# Patient Record
Sex: Female | Born: 1940 | Race: White | Hispanic: No | Marital: Single | Smoking: Former smoker
Health system: Southern US, Community
[De-identification: ages and names within clinical notes are randomized; demographics above are authoritative.]

## PROBLEM LIST (undated history)

## (undated) DIAGNOSIS — M1990 Unspecified osteoarthritis, unspecified site: Secondary | ICD-10-CM

## (undated) DIAGNOSIS — I491 Atrial premature depolarization: Secondary | ICD-10-CM

## (undated) DIAGNOSIS — Z01810 Encounter for preprocedural cardiovascular examination: Secondary | ICD-10-CM

## (undated) DIAGNOSIS — I471 Supraventricular tachycardia: Secondary | ICD-10-CM

## (undated) DIAGNOSIS — M19011 Primary osteoarthritis, right shoulder: Secondary | ICD-10-CM

## (undated) DIAGNOSIS — I447 Left bundle-branch block, unspecified: Secondary | ICD-10-CM

## (undated) DIAGNOSIS — T8859XA Other complications of anesthesia, initial encounter: Secondary | ICD-10-CM

## (undated) DIAGNOSIS — M40209 Unspecified kyphosis, site unspecified: Secondary | ICD-10-CM

## (undated) DIAGNOSIS — M722 Plantar fascial fibromatosis: Secondary | ICD-10-CM

## (undated) DIAGNOSIS — F419 Anxiety disorder, unspecified: Secondary | ICD-10-CM

## (undated) DIAGNOSIS — C801 Malignant (primary) neoplasm, unspecified: Secondary | ICD-10-CM

## (undated) DIAGNOSIS — F329 Major depressive disorder, single episode, unspecified: Secondary | ICD-10-CM

## (undated) DIAGNOSIS — K219 Gastro-esophageal reflux disease without esophagitis: Secondary | ICD-10-CM

## (undated) DIAGNOSIS — E785 Hyperlipidemia, unspecified: Secondary | ICD-10-CM

## (undated) DIAGNOSIS — M25511 Pain in right shoulder: Secondary | ICD-10-CM

## (undated) DIAGNOSIS — F32A Depression, unspecified: Secondary | ICD-10-CM

## (undated) DIAGNOSIS — T4145XA Adverse effect of unspecified anesthetic, initial encounter: Secondary | ICD-10-CM

## (undated) DIAGNOSIS — R1310 Dysphagia, unspecified: Secondary | ICD-10-CM

## (undated) DIAGNOSIS — I10 Essential (primary) hypertension: Secondary | ICD-10-CM

## (undated) HISTORY — DX: Left bundle-branch block, unspecified: I44.7

## (undated) HISTORY — DX: Encounter for preprocedural cardiovascular examination: Z01.810

## (undated) HISTORY — PX: TONSILLECTOMY: SUR1361

## (undated) HISTORY — DX: Hyperlipidemia, unspecified: E78.5

## (undated) HISTORY — DX: Unspecified kyphosis, site unspecified: M40.209

## (undated) HISTORY — DX: Plantar fascial fibromatosis: M72.2

## (undated) HISTORY — DX: Atrial premature depolarization: I49.1

## (undated) HISTORY — PX: CARDIAC CATHETERIZATION: SHX172

## (undated) HISTORY — DX: Pain in right shoulder: M25.511

## (undated) HISTORY — DX: Supraventricular tachycardia: I47.1

## (undated) HISTORY — PX: ABDOMINAL HYSTERECTOMY: SHX81

## (undated) HISTORY — DX: Dysphagia, unspecified: R13.10

## (undated) HISTORY — PX: OTHER SURGICAL HISTORY: SHX169

## (undated) HISTORY — DX: Essential (primary) hypertension: I10

---

## 1997-09-15 ENCOUNTER — Encounter: Admission: RE | Admit: 1997-09-15 | Discharge: 1997-09-15 | Payer: Self-pay | Admitting: Family Medicine

## 1997-09-27 ENCOUNTER — Ambulatory Visit (HOSPITAL_COMMUNITY): Admission: RE | Admit: 1997-09-27 | Discharge: 1997-09-27 | Payer: Self-pay | Admitting: Gynecology

## 1997-10-02 ENCOUNTER — Encounter: Admission: RE | Admit: 1997-10-02 | Discharge: 1997-10-02 | Payer: Self-pay | Admitting: Family Medicine

## 1997-10-26 ENCOUNTER — Other Ambulatory Visit: Admission: RE | Admit: 1997-10-26 | Discharge: 1997-10-26 | Payer: Self-pay | Admitting: Gynecology

## 1998-06-22 ENCOUNTER — Encounter: Admission: RE | Admit: 1998-06-22 | Discharge: 1998-06-22 | Payer: Self-pay | Admitting: Family Medicine

## 1998-09-21 ENCOUNTER — Encounter: Admission: RE | Admit: 1998-09-21 | Discharge: 1998-09-21 | Payer: Self-pay | Admitting: Sports Medicine

## 1998-11-08 ENCOUNTER — Ambulatory Visit (HOSPITAL_COMMUNITY): Admission: RE | Admit: 1998-11-08 | Discharge: 1998-11-08 | Payer: Self-pay | Admitting: Gynecology

## 1998-11-08 ENCOUNTER — Encounter: Payer: Self-pay | Admitting: Gynecology

## 1998-11-20 ENCOUNTER — Other Ambulatory Visit: Admission: RE | Admit: 1998-11-20 | Discharge: 1998-11-20 | Payer: Self-pay | Admitting: Gynecology

## 1999-11-04 ENCOUNTER — Encounter: Admission: RE | Admit: 1999-11-04 | Discharge: 1999-11-04 | Payer: Self-pay | Admitting: Family Medicine

## 1999-11-06 ENCOUNTER — Encounter: Payer: Self-pay | Admitting: Sports Medicine

## 1999-11-06 ENCOUNTER — Encounter: Admission: RE | Admit: 1999-11-06 | Discharge: 1999-11-06 | Payer: Self-pay | Admitting: Sports Medicine

## 1999-11-06 ENCOUNTER — Encounter: Admission: RE | Admit: 1999-11-06 | Discharge: 1999-11-06 | Payer: Self-pay | Admitting: Family Medicine

## 1999-11-14 ENCOUNTER — Ambulatory Visit (HOSPITAL_COMMUNITY): Admission: RE | Admit: 1999-11-14 | Discharge: 1999-11-14 | Payer: Self-pay | Admitting: Gynecology

## 1999-11-14 ENCOUNTER — Encounter: Payer: Self-pay | Admitting: Gynecology

## 1999-11-20 ENCOUNTER — Other Ambulatory Visit: Admission: RE | Admit: 1999-11-20 | Discharge: 1999-11-20 | Payer: Self-pay | Admitting: Gynecology

## 1999-12-09 ENCOUNTER — Emergency Department (HOSPITAL_COMMUNITY): Admission: EM | Admit: 1999-12-09 | Discharge: 1999-12-09 | Payer: Self-pay | Admitting: Emergency Medicine

## 2000-10-19 ENCOUNTER — Ambulatory Visit (HOSPITAL_COMMUNITY): Admission: RE | Admit: 2000-10-19 | Discharge: 2000-10-19 | Payer: Self-pay | Admitting: Sports Medicine

## 2000-11-19 ENCOUNTER — Encounter: Payer: Self-pay | Admitting: Gynecology

## 2000-11-19 ENCOUNTER — Ambulatory Visit (HOSPITAL_COMMUNITY): Admission: RE | Admit: 2000-11-19 | Discharge: 2000-11-19 | Payer: Self-pay | Admitting: Gynecology

## 2000-12-10 ENCOUNTER — Other Ambulatory Visit: Admission: RE | Admit: 2000-12-10 | Discharge: 2000-12-10 | Payer: Self-pay | Admitting: Gynecology

## 2001-04-20 ENCOUNTER — Encounter: Admission: RE | Admit: 2001-04-20 | Discharge: 2001-04-20 | Payer: Self-pay | Admitting: Sports Medicine

## 2001-05-07 ENCOUNTER — Encounter: Admission: RE | Admit: 2001-05-07 | Discharge: 2001-05-07 | Payer: Self-pay | Admitting: Sports Medicine

## 2001-05-07 ENCOUNTER — Encounter: Payer: Self-pay | Admitting: Sports Medicine

## 2001-10-22 ENCOUNTER — Ambulatory Visit (HOSPITAL_COMMUNITY): Admission: RE | Admit: 2001-10-22 | Discharge: 2001-10-22 | Payer: Self-pay | Admitting: Gastroenterology

## 2001-11-24 ENCOUNTER — Encounter: Payer: Self-pay | Admitting: Gynecology

## 2001-11-24 ENCOUNTER — Ambulatory Visit (HOSPITAL_COMMUNITY): Admission: RE | Admit: 2001-11-24 | Discharge: 2001-11-24 | Payer: Self-pay | Admitting: Gynecology

## 2001-12-17 ENCOUNTER — Other Ambulatory Visit: Admission: RE | Admit: 2001-12-17 | Discharge: 2001-12-17 | Payer: Self-pay | Admitting: Gynecology

## 2002-01-25 ENCOUNTER — Encounter: Admission: RE | Admit: 2002-01-25 | Discharge: 2002-01-25 | Payer: Self-pay | Admitting: Sports Medicine

## 2002-12-02 ENCOUNTER — Ambulatory Visit (HOSPITAL_COMMUNITY): Admission: RE | Admit: 2002-12-02 | Discharge: 2002-12-02 | Payer: Self-pay | Admitting: Gynecology

## 2002-12-02 ENCOUNTER — Encounter: Payer: Self-pay | Admitting: Gynecology

## 2002-12-19 ENCOUNTER — Other Ambulatory Visit: Admission: RE | Admit: 2002-12-19 | Discharge: 2002-12-19 | Payer: Self-pay | Admitting: Gynecology

## 2003-12-11 ENCOUNTER — Ambulatory Visit (HOSPITAL_COMMUNITY): Admission: RE | Admit: 2003-12-11 | Discharge: 2003-12-11 | Payer: Self-pay | Admitting: Gynecology

## 2003-12-12 ENCOUNTER — Encounter: Admission: RE | Admit: 2003-12-12 | Discharge: 2003-12-12 | Payer: Self-pay | Admitting: Gynecology

## 2004-01-22 ENCOUNTER — Other Ambulatory Visit: Admission: RE | Admit: 2004-01-22 | Discharge: 2004-01-22 | Payer: Self-pay | Admitting: Gynecology

## 2004-12-12 ENCOUNTER — Ambulatory Visit (HOSPITAL_COMMUNITY): Admission: RE | Admit: 2004-12-12 | Discharge: 2004-12-12 | Payer: Self-pay | Admitting: Cardiology

## 2004-12-12 ENCOUNTER — Ambulatory Visit (HOSPITAL_COMMUNITY): Admission: RE | Admit: 2004-12-12 | Discharge: 2004-12-12 | Payer: Self-pay | Admitting: Gynecology

## 2004-12-12 ENCOUNTER — Encounter (INDEPENDENT_AMBULATORY_CARE_PROVIDER_SITE_OTHER): Payer: Self-pay | Admitting: *Deleted

## 2004-12-23 ENCOUNTER — Other Ambulatory Visit: Admission: RE | Admit: 2004-12-23 | Discharge: 2004-12-23 | Payer: Self-pay | Admitting: Gynecology

## 2005-02-20 ENCOUNTER — Ambulatory Visit (HOSPITAL_COMMUNITY): Admission: RE | Admit: 2005-02-20 | Discharge: 2005-02-20 | Payer: Self-pay | Admitting: Cardiology

## 2005-03-03 ENCOUNTER — Ambulatory Visit (HOSPITAL_COMMUNITY): Admission: RE | Admit: 2005-03-03 | Discharge: 2005-03-03 | Payer: Self-pay | Admitting: Cardiology

## 2005-12-22 ENCOUNTER — Ambulatory Visit (HOSPITAL_COMMUNITY): Admission: RE | Admit: 2005-12-22 | Discharge: 2005-12-22 | Payer: Self-pay | Admitting: Gynecology

## 2006-01-06 ENCOUNTER — Other Ambulatory Visit: Admission: RE | Admit: 2006-01-06 | Discharge: 2006-01-06 | Payer: Self-pay | Admitting: Gynecology

## 2006-03-10 ENCOUNTER — Emergency Department (HOSPITAL_COMMUNITY): Admission: EM | Admit: 2006-03-10 | Discharge: 2006-03-10 | Payer: Self-pay | Admitting: Family Medicine

## 2006-10-05 ENCOUNTER — Emergency Department (HOSPITAL_COMMUNITY): Admission: EM | Admit: 2006-10-05 | Discharge: 2006-10-06 | Payer: Self-pay | Admitting: Emergency Medicine

## 2006-10-09 ENCOUNTER — Ambulatory Visit (HOSPITAL_COMMUNITY): Admission: RE | Admit: 2006-10-09 | Discharge: 2006-10-09 | Payer: Self-pay | Admitting: Cardiology

## 2006-11-06 ENCOUNTER — Encounter (INDEPENDENT_AMBULATORY_CARE_PROVIDER_SITE_OTHER): Payer: Self-pay | Admitting: Gastroenterology

## 2006-11-06 ENCOUNTER — Ambulatory Visit (HOSPITAL_COMMUNITY): Admission: RE | Admit: 2006-11-06 | Discharge: 2006-11-06 | Payer: Self-pay | Admitting: Gastroenterology

## 2006-12-24 ENCOUNTER — Ambulatory Visit (HOSPITAL_COMMUNITY): Admission: RE | Admit: 2006-12-24 | Discharge: 2006-12-24 | Payer: Self-pay | Admitting: Gynecology

## 2007-01-07 ENCOUNTER — Other Ambulatory Visit: Admission: RE | Admit: 2007-01-07 | Discharge: 2007-01-07 | Payer: Self-pay | Admitting: Gynecology

## 2007-12-28 ENCOUNTER — Ambulatory Visit (HOSPITAL_COMMUNITY): Admission: RE | Admit: 2007-12-28 | Discharge: 2007-12-28 | Payer: Self-pay | Admitting: Gynecology

## 2008-01-11 ENCOUNTER — Other Ambulatory Visit: Admission: RE | Admit: 2008-01-11 | Discharge: 2008-01-11 | Payer: Self-pay | Admitting: Gynecology

## 2009-01-02 ENCOUNTER — Ambulatory Visit (HOSPITAL_COMMUNITY): Admission: RE | Admit: 2009-01-02 | Discharge: 2009-01-02 | Payer: Self-pay | Admitting: Family Medicine

## 2009-12-25 ENCOUNTER — Encounter: Admission: RE | Admit: 2009-12-25 | Discharge: 2009-12-25 | Payer: Self-pay | Admitting: Gastroenterology

## 2010-01-09 ENCOUNTER — Ambulatory Visit (HOSPITAL_COMMUNITY): Admission: RE | Admit: 2010-01-09 | Discharge: 2010-01-09 | Payer: Self-pay | Admitting: Family Medicine

## 2010-10-08 NOTE — Cardiovascular Report (Signed)
[name], [name] NO.:  [id number]   MEDICAL RECORD NO.:  [id number]          PATIENT TYPE:  OIB   LOCATION:  2854                         FACILITY:  [facility]   PHYSICIAN:  Madaline Savage, M.D.DATE OF BIRTH:  10/28/1940   DATE OF PROCEDURE:  10/09/2006  DATE OF DISCHARGE:                            CARDIAC CATHETERIZATION   PROCEDURES PERFORMED:  Outpatient cardiac catheterization which was a  combined left heart catheterization which was uncomplicated.  We also  did an abdominal aortogram above the renal arteries to look for obvious  renal artery stenosis which was not found.   COMPLICATIONS:  None.   ENTRY SITE:  Right femoral.   DYE USED:  Omnipaque.   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  She recently presented to the [facility] Emergency Room after  a wave of heat came over her body, and she was found to have an elevated  blood pressure.  She was ultimately released from the emergency room  after observation and came to my office yesterday where I took a  clinical history and looked at her blood pressure logs.  Eighty percent  of her blood pressures were above 120 and about 20-30% of her pressures  were above 160.  The patient has been on Diovan 160 mg daily and when  she up-titrated the dose from 80 to 160, she got the current symptoms  she describes.  I have attributed those symptoms to the Diovan and  therefore stopped it but feel that the patient must have better blood  pressure control with 1 or 2 agents.   Because of her family history of coronary disease, we brought her to the  cath lab today to again look to see if she has coronary disease.  The  reason for this is because she has the new development of a left bundle-  branch block.  Today's procedure was  performed electively without  complications.   RESULTS:  1. Pressures:  Left ventricular pressure was 170/77, mean 120.  Later      during the case, the LV pressure dropped to 145/60 with a mean of      100.  There was no significant aortic valve gradient by pull-back      technique.  2. Angiographic results:  The patient's coronary arteries were      angiographically patent.  No lesions were seen throughout.      Anatomically, there was an LAD coursing through the cardiac apex      giving rise to 1 medium size diagonal branch free of any disease.      Circumflex was nondominant giving rise to 2 small obtuse marginal      branches, a posterolateral branch and then a distal circumflex      branch.  No lesions seen in that circumflex system.   Right coronary artery was dominant giving rise to a posterior descending  then a posterolateral branch and then  the posterolateral branch became a  sinus node branch.  No lesions seen.  Left ventricular angiography  showed good contractility of all wall segments except for a tiny area at  the apex which was dyskinetic, and the significance of this area is  questionable.   Abdominal aortography above the renal showed that both renal arteries  were patent, and there was no evidence of infrarenal artery pathology.   FINAL DIAGNOSES:  1. Angiographically patent coronary arteries.  2. Well preserved left ventricular systolic function.  3. Tiny area of dyskinesis of the apex of the heart of questionable      significance.  4. New left bundle-branch block since an EKG by comparison from 2      years ago.  5. Treated hypertension but uncontrolled hypertension.  6. No evidence of renal artery stenosis.   PLAN:  The patient will need better control of blood pressure,  reassurance, and probably needs to have treatment of a significant  anxiety disorder.           ______________________________  Madaline Savage, M.D.     WHG/MEDQ  D:   10/09/2006  T:  10/09/2006  Job:  [id number]   cc:   Merlene Laughter. Renae Gloss, M.D.  Cath Lab

## 2010-10-08 NOTE — Op Note (Signed)
Jasmine Swanson, Jasmine Swanson             ACCOUNT NO.:  [id number]   MEDICAL RECORD NO.:  [id number]          PATIENT TYPE:  AMB   LOCATION:  ENDO                         [facility]AC[facility]:  [facility]   PHYSICIAN:  Bernette Redbird, M.D.   DATE OF BIRTH:  1940-07-05   DATE OF PROCEDURE:  11/06/2006  DATE OF DISCHARGE:                               OPERATIVE REPORT   PROCEDURE:  Colonoscopy with biopsy.   INDICATIONS:  A 70 year old female with family history of colon cancer  in her father at an advanced age with a negative screening colonoscopy 5  years ago.   FINDINGS:  Two diminutive sessile polyps removed from the ascending  colon.   DESCRIPTION OF PROCEDURE:  The nature, purpose, risks of the procedure  were familiar to the patient from prior examination and she provided  written consent.  Sedation was fentanyl 100 mcg and Versed 10 mg IV  without arrhythmias or desaturation.  The Pentax adult video colonoscope  was advanced to the terminal ileum without significant difficulty and  pullback was then performed.  The quality of prep was excellent.  It is  felt that all areas were well seen.   On the way in, I encountered two small sessile polyps near each other in  the region of the hepatic flexure or ascending colon.  The first was  about 2-3 mm across; the other about 3-4 mm across, and each was removed  by single cold biopsy.  No large polyps, cancer, colitis, vascular  malformations, or diverticular disease were observed and retroflexion of  the rectum was unremarkable.   The patient tolerated the procedure well and there no apparent  complications.   IMPRESSION:  1. Two diminutive polyps removed as described above.  2. Family history of colon cancer.   PLAN:  Await pathology results.  Anticipate colonoscopic followup in 5  years.           ______________________________  Bernette Redbird, M.D.     RB/MEDQ  D:  11/06/2006  T:  11/07/2006  Job:  [id number]   cc:   Merlene Laughter. Renae Gloss,  M.D.  Fax: [phone]

## 2010-10-11 NOTE — Cardiovascular Report (Signed)
NAMELAPORSCHA, LINEHAN NO.:  [id number]   MEDICAL RECORD NO.:  [id number]          PATIENT TYPE:  OIB   LOCATION:  2899                         FACILITY:  [facility]   PHYSICIAN:  Madaline Savage, M.D.DATE OF BIRTH:  04-06-1941   DATE OF PROCEDURE:  03/03/2005  DATE OF DISCHARGE:                              CARDIAC CATHETERIZATION   PROCEDURES PERFORMED:  Outpatient cardiac catheterization consisting of:  A.  Left retrograde left heart catheterization.  B.  Left ventricular angiography.  C.  Selective coronary angiography.   COMPLICATIONS:  None.   ENTRY SITE:  Right femoral dye used, Omnipaque.   MEDICATIONS GIVEN:  Versed 1 mg IV, fentanyl 25 mg given twice for anxiety,  labetalol 20 mg IV for blood pressure.   COMPLICATIONS:  None.   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 She is at the [employer]. She has a family history of a mother who had coronary disease  and eventually died of complications related to coronary disease. The  patient herself has elevated cholesterol. She has those two predominant risk  factors. We have just discovered that she has an elevated blood pressure  which makes a third risk factor.   Recently she underwent, stress Cardiolite for risk factor stratification and  was and was found to have both an ST-segment depression during exercise as  well as was a mild anteroapical reperfusion defect seen. She was appraised  of the results and hesitated for some weeks and finally submitted to  undergoing cardiac catheterization which was performed at [facility] lab 3  on March 03, 2005 without complications.   RESULTS:  Left ventricular pressure was 195/11 and diastolic pressure 23,  central aortic pressure 185/75, mean of 126. There was no significant aortic  valve gradient by pullback technique.   Angiographic results show that the  coronary arteries were angiographically  patent with no lesions. They consisted of a very short left main that was  normal, an LAD that crossed the cardiac apex giving rise to a single  diagonal branch of significance and a single small septal perforator branch  of significance. The circumflex was nondominant. It gave rise to 3 obtuse  marginal branches, the third of which was fairly large. The circumflex  itself stayed in the AV groove and did not give a posterolateral or  posterior descending branch.   The right coronary artery was also normal and contained a medium size  posterior descending and a very large sinus node branch. No lesions were  seen throughout the right coronary system.   Left ventricular angiography in an RAO projection showed normal  contractility. No significant wall motion abnormalities, ejection fraction  estimate of 55-65% without mitral regurgitation. Abdominal aortography  because of the patient's elevated blood pressure showed no evidence of renal  artery stenosis. Both renal arteries were single, no abdominal aorta  pathology was seen and the common iliac's were normal as well.   FINAL DIAGNOSES:  1.  Three cardiac risk factors in a 70 year old  asymptomatic woman with an      abnormal Cardiolite stress test.  2.  Angiographic patent coronary arteries right coronary dominant system.  3.  Normal left ventricular systolic function.  4.  Normal renal arteries and abdominal aorta.   PLAN:  The patient is reassured. We will discharge in 4 hours. We will  continue with risk factor modification and the patient can be reassured  based on the positive aspects of this study.           ______________________________  Madaline Savage, M.D.     WHG/MEDQ  D:  03/03/2005  T:  03/03/2005  Job:  [id number]   cc:   Merlene Laughter. Renae Gloss, M.D.  Fax: [phone]   [facility]

## 2010-10-11 NOTE — Procedures (Signed)
. [facility]  Patient:    MILAN, CLARE Visit Number: [id number] MRN: [id number]          Service Type: END Location: ENDO Attending Physician:  Rich Brave Dictated by:   Florencia Reasons, M.D. Proc. Date: 10/22/01 Admit Date:  10/22/2001                             Procedure Report  PROCEDURE:  Colonoscopy.  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  Screening colonoscopy five years ago was normal.  FINDINGS:  Normal exam.  DESCRIPTION OF PROCEDURE:  _____ were familiar to the patient, who provided written consent.  Sedation is fentanyl 100 mcg and Versed 10 mg IV prior to and during the course of the procedure without arrhythmias or desaturation. The Olympus adjustable-tension pediatric video colonoscope was advanced without significant difficulty to the cecum and for a short distance into the normal-appearing terminal ileum, whereupon pullback was initiated.  External abdominal compression was used to help control looping.  The quality of the prep was excellent, and it is felt that all areas were well-seen.  This was a normal examination.  No polyps, cancer, colitis, vascular malformations, or diverticular disease were observed.  Retroflexion in the rectum as well as reinspection of the rectosigmoid was unremarkable.  No biopsies were taken.  The patient tolerated the procedure well, and there were no apparent complications.  IMPRESSION:  Normal colonoscopy.  PLAN:  Follow-up colonoscopy five years. Dictated by:   Florencia Reasons, M.D. Attending Physician:  Rich Brave DD:  10/22/01 TD:  10/25/01 Job: [id number]

## 2010-10-11 NOTE — Op Note (Signed)
NAMEDIANNA, EWALD             ACCOUNT NO.:  [id number]   MEDICAL RECORD NO.:  [id number]          PATIENT TYPE:  OUT   LOCATION:  NUC                          FACILITY:  [facility]   PHYSICIAN:  Vonna Kotyk R. Jacinto Halim, MD       DATE OF BIRTH:  02-08-1941   DATE OF PROCEDURE:  02/20/2005  DATE OF DISCHARGE:  02/20/2005                                 OPERATIVE REPORT   PROCEDURE PERFORMED:  Treadmill exercise Cardiolite stress test using Bruce  protocol.   INDICATION:  Ms. Kayelyn Lemon is a 70 year old female with a very strong  family history of premature coronary artery disease and hyperlipidemia, who  is being screened for coronary artery disease.   STRESS DATA:  The resting ECG demonstrates normal sinus rhythm.   Resting heart rate is 86 beats per minute with a blood pressure of 155/73  mmHg.   The maximum heart rate was 166 beats 90% of max predicted heart rate for age  with a blood pressure of 226/64 mmHg.   At seven minutes into exercise, the patient started to have ST segment  depression.  At peak exercise, there was a 2 mm horizontal ST segment  depression associated with T-wave inversion noted in the inferolateral  leads.  This change has persisted for six minutes into recovery.  This was  positive for myocardial ischemia.  The patient remained asymptomatic.   FINAL INTERPRETATION:  1.  Resting shows NSR. Stress ECG was positive for myocardial ischemia with      development of 2 mm horizontal ST segment depression at peak exercise,      which persisted for six minutes into recovery.  2.  Hypertensive blood pressure response.  3.  The Cardiolite portion is pending.      Cristy Hilts. Jacinto Halim, MD  Electronically Signed     JRG/MEDQ  D:  02/24/2005  T:  02/24/2005  Job:  [id number]

## 2010-10-16 ENCOUNTER — Other Ambulatory Visit: Payer: Self-pay | Admitting: Dermatology

## 2010-12-10 ENCOUNTER — Other Ambulatory Visit (HOSPITAL_COMMUNITY): Payer: Self-pay | Admitting: Family Medicine

## 2010-12-10 DIAGNOSIS — Z1231 Encounter for screening mammogram for malignant neoplasm of breast: Secondary | ICD-10-CM

## 2011-01-15 ENCOUNTER — Ambulatory Visit (HOSPITAL_COMMUNITY): Payer: Medicare Other

## 2011-01-22 ENCOUNTER — Ambulatory Visit (HOSPITAL_COMMUNITY)
Admission: RE | Admit: 2011-01-22 | Discharge: 2011-01-22 | Disposition: A | Discharge status: Home / Self Care | Payer: Medicare Other | Source: Ambulatory Visit | Attending: Family Medicine | Admitting: Family Medicine

## 2011-01-22 DIAGNOSIS — Z1231 Encounter for screening mammogram for malignant neoplasm of breast: Secondary | ICD-10-CM | POA: Insufficient documentation

## 2011-11-14 ENCOUNTER — Ambulatory Visit: Payer: Medicare Other | Attending: Family Medicine | Admitting: Physical Therapy

## 2011-11-14 DIAGNOSIS — M542 Cervicalgia: Secondary | ICD-10-CM | POA: Insufficient documentation

## 2011-11-14 DIAGNOSIS — M2560 Stiffness of unspecified joint, not elsewhere classified: Secondary | ICD-10-CM | POA: Insufficient documentation

## 2011-11-14 DIAGNOSIS — IMO0001 Reserved for inherently not codable concepts without codable children: Secondary | ICD-10-CM | POA: Insufficient documentation

## 2011-11-18 ENCOUNTER — Ambulatory Visit: Payer: Medicare Other | Admitting: Physical Therapy

## 2011-11-21 ENCOUNTER — Ambulatory Visit: Payer: Medicare Other | Admitting: Physical Therapy

## 2011-11-25 ENCOUNTER — Ambulatory Visit: Payer: MEDICARE | Attending: Family Medicine | Admitting: Physical Therapy

## 2011-11-25 DIAGNOSIS — M542 Cervicalgia: Secondary | ICD-10-CM | POA: Insufficient documentation

## 2011-11-25 DIAGNOSIS — M2560 Stiffness of unspecified joint, not elsewhere classified: Secondary | ICD-10-CM | POA: Insufficient documentation

## 2011-11-25 DIAGNOSIS — IMO0001 Reserved for inherently not codable concepts without codable children: Secondary | ICD-10-CM | POA: Insufficient documentation

## 2011-11-28 ENCOUNTER — Ambulatory Visit: Payer: MEDICARE | Admitting: Physical Therapy

## 2011-12-02 ENCOUNTER — Ambulatory Visit: Payer: MEDICARE | Admitting: Physical Therapy

## 2011-12-04 ENCOUNTER — Encounter: Payer: Medicare Other | Admitting: Physical Therapy

## 2011-12-09 ENCOUNTER — Ambulatory Visit: Payer: MEDICARE | Admitting: Physical Therapy

## 2011-12-12 ENCOUNTER — Ambulatory Visit: Payer: MEDICARE | Admitting: Physical Therapy

## 2011-12-16 ENCOUNTER — Encounter: Payer: Medicare Other | Admitting: Physical Therapy

## 2011-12-19 ENCOUNTER — Encounter: Payer: Medicare Other | Admitting: Physical Therapy

## 2011-12-30 ENCOUNTER — Other Ambulatory Visit (HOSPITAL_COMMUNITY): Payer: Self-pay | Admitting: Family Medicine

## 2011-12-30 DIAGNOSIS — Z1231 Encounter for screening mammogram for malignant neoplasm of breast: Secondary | ICD-10-CM

## 2012-01-19 ENCOUNTER — Ambulatory Visit (INDEPENDENT_AMBULATORY_CARE_PROVIDER_SITE_OTHER): Payer: Medicare Other | Admitting: Family Medicine

## 2012-01-19 ENCOUNTER — Encounter: Payer: Self-pay | Admitting: Family Medicine

## 2012-01-19 ENCOUNTER — Ambulatory Visit: Payer: Medicare Other | Attending: Family Medicine | Admitting: Physical Therapy

## 2012-01-19 VITALS — HR 74 | Ht 64.0 in | Wt 129.0 lb

## 2012-01-19 DIAGNOSIS — M25511 Pain in right shoulder: Secondary | ICD-10-CM

## 2012-01-19 DIAGNOSIS — IMO0001 Reserved for inherently not codable concepts without codable children: Secondary | ICD-10-CM | POA: Insufficient documentation

## 2012-01-19 DIAGNOSIS — M542 Cervicalgia: Secondary | ICD-10-CM | POA: Insufficient documentation

## 2012-01-19 DIAGNOSIS — M2560 Stiffness of unspecified joint, not elsewhere classified: Secondary | ICD-10-CM | POA: Insufficient documentation

## 2012-01-19 DIAGNOSIS — M25519 Pain in unspecified shoulder: Secondary | ICD-10-CM

## 2012-01-19 NOTE — Patient Instructions (Addendum)
You have rotator cuff impingement, possibly developing frozen shoulder but at very early stages if so. Try to avoid painful activities (overhead activities, lifting with extended arm) as much as possible. Capsaicin, flexall, or aspercreme 4 times a day to affected shoulder for pain. Subacromial injection may be beneficial to help with pain and to decrease inflammation if struggling. Consider physical therapy. Do home exercise program with theraband and scapular stabilization exercises daily - these are very important for long term relief even if an injection was given; each exercise 3 sets of 10 once a day. Heat 15 minutes at a time 3-4 times a day If not improving at follow-up we will consider injection and/or physical therapy. Follow up with me in 1 month.

## 2012-01-20 ENCOUNTER — Encounter: Payer: Self-pay | Admitting: Family Medicine

## 2012-01-20 DIAGNOSIS — M25511 Pain in right shoulder: Secondary | ICD-10-CM

## 2012-01-20 HISTORY — DX: Pain in right shoulder: M25.511

## 2012-01-20 NOTE — Progress Notes (Signed)
  Subjective:    Patient ID: Jasmine Swanson, female    DOB: 1940/06/10, 71 y.o.   MRN: [id number]  PCP: Gweneth Dimitri MD  HPI 71 yo F here for right shoulder pain.  Patient denies known injury. States since end of June 2013 started to develop right shoulder pain worse with turning motions (like steering wheel), overhead, reaching behind. Previously went to PT for neck issues and having trouble doing some exercises as they hurt right shoulder. ROM is painful, limited. Heat helps. Difficulty taking antiinflammatories so avoids these. No prior issues with right shoulder.  Past Medical History  Diagnosis Date  . Hypertension   . Hyperlipidemia     Current Outpatient Prescriptions on File Prior to Visit  Medication Sig Dispense Refill  . metoprolol (TOPROL-XL) 200 MG 24 hr tablet Take 200 mg by mouth daily.      . simvastatin (ZOCOR) 80 MG tablet Take 80 mg by mouth at bedtime.        Past Surgical History  Procedure Date  . Abdominal hysterectomy     Allergies  Allergen Reactions  . Codeine   . Diovan (Valsartan)   . Niacin And Related     History   Social History  . Marital Status: Divorced    Spouse Name: N/A    Number of Children: N/A  . Years of Education: N/A   Occupational History  . Not on file.   Social History Main Topics  . Smoking status: Never Smoker   . Smokeless tobacco: Not on file  . Alcohol Use: Not on file  . Drug Use: Not on file  . Sexually Active: Not on file   Other Topics Concern  . Not on file   Social History Narrative  . No narrative on file    Family History  Problem Relation Age of Onset  . Hyperlipidemia Mother   . Hypertension Mother   . Heart attack Mother   . Sudden death Neg Hx   . Diabetes Neg Hx     Pulse 74  Ht 5\' 4"  (1.626 m)  Wt 129 lb (58.514 kg)  BMI 22.14 kg/m2  Review of Systems See HPI above.    Objective:   Physical Exam Gen: NAD  R shoulder: No swelling, ecchymoses.  No gross  deformity. No TTP biceps tendon, AC joint.  Mild post capsule TTP. Lacks about 10 degrees ER.  Otherwise FROM - + painful arc. Positive Hawkins, Neers. Negative Speeds, Yergasons. Strength 5/5 with empty can and resisted internal/external rotation.  Mild pain with ER and empty can. Negative apprehension. NV intact distally.  L shoulder: FROM without pain or weakness.    Assessment & Plan:  1. Right shoulder pain - 2/2 rotator cuff impingement.  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  She would like to start home exercise program first and consider injection and/or PT only if this is not helping.  Demonstrated today and handout provided.  Discussed topical medications to help with pain.  Heat as needed.  F/u in 1 month for reevaluation.

## 2012-01-20 NOTE — Assessment & Plan Note (Signed)
2/2 rotator cuff impingement.  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  She would like to start home exercise program first and consider injection and/or PT only if this is not helping.  Demonstrated today and handout provided.  Discussed topical medications to help with pain.  Heat as needed.  F/u in 1 month for reevaluation.

## 2012-01-21 ENCOUNTER — Encounter (HOSPITAL_COMMUNITY): Payer: Self-pay | Admitting: Emergency Medicine

## 2012-01-21 ENCOUNTER — Emergency Department (HOSPITAL_COMMUNITY)
Admission: EM | Admit: 2012-01-21 | Discharge: 2012-01-21 | Disposition: A | Discharge status: Home / Self Care | Payer: MEDICARE | Attending: Emergency Medicine | Admitting: Emergency Medicine

## 2012-01-21 DIAGNOSIS — I10 Essential (primary) hypertension: Secondary | ICD-10-CM | POA: Insufficient documentation

## 2012-01-21 DIAGNOSIS — R000 Tachycardia, unspecified: Secondary | ICD-10-CM | POA: Insufficient documentation

## 2012-01-21 LAB — TROPONIN I: Troponin I: <0.3 ng/mL (ref <0.30)

## 2012-01-21 LAB — BASIC METABOLIC PANEL
Calcium: 10.5 mg/dL (ref 8.4–10.5)
GFR calc Af Amer: >90 mL/min (ref >90)
Potassium: 3.8 mEq/L (ref 3.5–5.1)

## 2012-01-21 MED ORDER — ACETAMINOPHEN 325 MG PO TABS
650.0000 mg | ORAL_TABLET | Freq: Once | ORAL | Status: AC
  Administered 2012-01-21: 650 mg via ORAL
  Filled 2012-01-21: qty 2

## 2012-01-21 MED ORDER — LABETALOL HCL 5 MG/ML IV SOLN
20.0000 mg | Freq: Once | INTRAVENOUS | Status: AC
  Administered 2012-01-21: 20 mg via INTRAVENOUS
  Filled 2012-01-21: qty 4

## 2012-01-21 MED ORDER — SODIUM CHLORIDE 0.9 % IV SOLN
INTRAVENOUS | Status: DC
  Administered 2012-01-21: 12:00:00 via INTRAVENOUS

## 2012-01-21 NOTE — ED Provider Notes (Signed)
I saw and evaluated the patient, reviewed the resident's note and I agree with the findings and plan. She was at the beauty shop getting her hair done.  She developed a sensation of feeling warm with palpitations.   She denies pain. She denies sob, n/v/light headedness.   She was anxious and staff called ems.    She had htn and tachycardia.  txd with labetalol.  sxs resolved now.  No signs acs.   No end organ damage.  Cheri Guppy, MD 01/21/12 [phone]

## 2012-01-21 NOTE — ED Provider Notes (Signed)
History     CSN: [id number]  Arrival date & time 01/21/12  1013   First MD Initiated Contact with Patient 01/21/12 1157      Chief Complaint  Patient presents with  . Palpitations    HPI 71 yo female with h/o HTN who presents with sudden onset of palpitations this morning. She states that she applied capsaicin cream to her right shoulder and 1hr later felt like her heart was beating fast, associated with sensation of overwhelming warmth through her body as well as shaking and tremulousness. She felt dizzy as well. No chest pain, no shortness of breath, no nausea or vomiting.  She has had similar episodes in the past with use of nystatin and diovan.  Past Medical History  Diagnosis Date  . Hypertension   . Hyperlipidemia     Past Surgical History  Procedure Date  . Abdominal hysterectomy   . Tonsillectomy     Family History  Problem Relation Age of Onset  . Hyperlipidemia Mother   . Hypertension Mother   . Heart attack Mother   . Sudden death Neg Hx   . Diabetes Neg Hx     History  Substance Use Topics  . Smoking status: Never Smoker   . Smokeless tobacco: Not on file  . Alcohol Use: Not on file    Review of Systems  All other systems reviewed and are negative.    Allergies  Codeine; Diovan; Niacin and related; and Solu-medrol  Home Medications   Current Outpatient Rx  Name Route Sig Dispense Refill  . ACETAMINOPHEN ER 650 MG PO TBCR Oral Take 650 mg by mouth every 8 (eight) hours as needed. For pain    . ASPIRIN 81 MG PO CHEW Oral Chew 81 mg by mouth 3 (three) times a week. Mon-Wed-Fri    . CALCIUM-VITAMIN D 600-400 MG-UNIT PO TABS Oral Take 1 tablet by mouth daily.    Marland Kitchen CALCIUM CARBONATE ANTACID 500 MG PO CHEW Oral Chew 1 tablet by mouth daily as needed. For upset stomach    . CAPSAICIN 0.025 % EX GEL Apply externally Apply topically.    Marland Kitchen VITAMIN D 2000 UNITS PO CAPS Oral Take 1 capsule by mouth daily.    . OMEGA-3 FATTY ACIDS 1000 MG PO CAPS Oral Take  1 g by mouth 3 (three) times daily.    Marland Kitchen FLAXSEED OIL 1000 MG PO CAPS Oral Take 1,000 mg by mouth daily.    Marland Kitchen MAGNESIUM 250 MG PO TABS Oral Take 1 tablet by mouth daily.    Marland Kitchen METOPROLOL SUCCINATE ER 200 MG PO TB24 Oral Take 200 mg by mouth daily.    Marland Kitchen OMEPRAZOLE 20 MG PO CPDR Oral Take 20 mg by mouth every other day.    Marland Kitchen VITAMIN B-6 100 MG PO TABS Oral Take 100 mg by mouth daily.    Marland Kitchen SIMVASTATIN 80 MG PO TABS Oral Take 80 mg by mouth daily.     Marland Kitchen VITAMIN A PO Oral Take 5,000 Units by mouth 2 (two) times a week. Mon-Fri    . VITAMIN C 500 MG PO TABS Oral Take 500 mg by mouth daily.      BP 186/80  Pulse 93  Temp 98.4 F (36.9 C) (Oral)  Resp 18  SpO2 99%  Physical Exam  Constitutional: She is oriented to person, place, and time. No distress.  HENT:  Mouth/Throat: Oropharynx is clear and moist.  Eyes: EOM are normal. Pupils are equal, round, and reactive to  light.  Neck: Normal range of motion.  Cardiovascular: Normal rate, regular rhythm and normal heart sounds.   Pulmonary/Chest: Effort normal and breath sounds normal. No respiratory distress. She has no wheezes.  Abdominal: Soft. She exhibits no distension. There is no tenderness.  Musculoskeletal: She exhibits no edema.  Neurological: She is alert and oriented to person, place, and time.  Skin: No rash noted.  Psychiatric: She has a normal mood and affect. Her behavior is normal.    ED Course  Procedures (including critical care time)   Date: 01/21/2012  Rate: 105  Rhythm: normal sinus rhythm  QRS Axis: left bundle branch block with QRS:122  Intervals: QRS widened  ST/T Wave abnormalities: normal  Narrative Interpretation: left bundle branch block  Old EKG Reviewed: none  BMET    Component Value Date/Time   NA 139 01/21/2012 1108   K 3.8 01/21/2012 1108   CL 99 01/21/2012 1108   CO2 23 01/21/2012 1108   GLUCOSE 121* 01/21/2012 1108   BUN 9 01/21/2012 1108   CREATININE 0.57 01/21/2012 1108   CALCIUM 10.5 01/21/2012  1108   GFRNONAA 90* 01/21/2012 1108   GFRAA >90 01/21/2012 1108    1. Hypertension       MDM  Patient reports having been diagnosed with a left bundle branch block before for which she had negative caths. She denies any chest pain or shortness of breath during her ED course. Troponin normal. BMP normal.  Had initially elevated blood pressure at 238/115 with tachycardia to 108's for which she was given labetolol 20mg  IV.  Tachycardia persisted and additional labetalol dose 20mg  was administered with improvement of heart rate. Patient discharged home in stable medical condition with follow up with Primary care doctor and red flags for return to the ED.         Lonia Skinner, MD 01/21/12 [phone]

## 2012-01-21 NOTE — ED Notes (Signed)
Pt arrived by EMS. While getting her hair done this morning she started having palpitations. Pt stated that she applied a medication patch that is OTC for rotator cuff problems. Intermittent palpitations have been occuring. Denies chest pain, dizziness and shortness of breath.

## 2012-01-25 NOTE — ED Provider Notes (Signed)
I saw and evaluated the patient, reviewed the resident's note and I agree with the findings and plan.  Cheri Guppy, MD 01/25/12 [phone]

## 2012-01-28 ENCOUNTER — Ambulatory Visit (HOSPITAL_COMMUNITY)
Admission: RE | Admit: 2012-01-28 | Discharge: 2012-01-28 | Disposition: A | Discharge status: Home / Self Care | Payer: Medicare Other | Source: Ambulatory Visit | Attending: Family Medicine | Admitting: Family Medicine

## 2012-01-28 DIAGNOSIS — Z1231 Encounter for screening mammogram for malignant neoplasm of breast: Secondary | ICD-10-CM | POA: Insufficient documentation

## 2012-03-08 ENCOUNTER — Other Ambulatory Visit: Payer: Self-pay | Admitting: Gastroenterology

## 2012-10-13 ENCOUNTER — Other Ambulatory Visit: Payer: Self-pay | Admitting: Dermatology

## 2012-12-31 ENCOUNTER — Other Ambulatory Visit (HOSPITAL_COMMUNITY): Payer: Self-pay | Admitting: Family Medicine

## 2012-12-31 DIAGNOSIS — Z1231 Encounter for screening mammogram for malignant neoplasm of breast: Secondary | ICD-10-CM

## 2013-02-01 ENCOUNTER — Ambulatory Visit (HOSPITAL_COMMUNITY)
Admission: RE | Admit: 2013-02-01 | Discharge: 2013-02-01 | Disposition: A | Discharge status: Home / Self Care | Payer: Medicare Other | Source: Ambulatory Visit | Attending: Family Medicine | Admitting: Family Medicine

## 2013-02-01 DIAGNOSIS — Z1231 Encounter for screening mammogram for malignant neoplasm of breast: Secondary | ICD-10-CM | POA: Insufficient documentation

## 2013-03-30 ENCOUNTER — Encounter: Payer: Self-pay | Admitting: Cardiology

## 2013-06-15 ENCOUNTER — Encounter: Payer: Self-pay | Admitting: *Deleted

## 2013-06-16 ENCOUNTER — Ambulatory Visit: Payer: Medicare Other | Admitting: Cardiology

## 2013-06-17 ENCOUNTER — Encounter (INDEPENDENT_AMBULATORY_CARE_PROVIDER_SITE_OTHER): Payer: Self-pay

## 2013-06-17 ENCOUNTER — Ambulatory Visit (INDEPENDENT_AMBULATORY_CARE_PROVIDER_SITE_OTHER): Payer: Medicare Other | Admitting: Cardiology

## 2013-06-17 ENCOUNTER — Encounter: Payer: Self-pay | Admitting: Cardiology

## 2013-06-17 VITALS — BP 209/84 | HR 110 | Ht 64.0 in | Wt 131.0 lb

## 2013-06-17 DIAGNOSIS — E785 Hyperlipidemia, unspecified: Secondary | ICD-10-CM | POA: Insufficient documentation

## 2013-06-17 DIAGNOSIS — I10 Essential (primary) hypertension: Secondary | ICD-10-CM | POA: Insufficient documentation

## 2013-06-17 DIAGNOSIS — I471 Supraventricular tachycardia, unspecified: Secondary | ICD-10-CM | POA: Insufficient documentation

## 2013-06-17 DIAGNOSIS — I491 Atrial premature depolarization: Secondary | ICD-10-CM

## 2013-06-17 DIAGNOSIS — I4719 Other supraventricular tachycardia: Secondary | ICD-10-CM | POA: Insufficient documentation

## 2013-06-17 HISTORY — DX: Supraventricular tachycardia: I47.1

## 2013-06-17 HISTORY — DX: Essential (primary) hypertension: I10

## 2013-06-17 HISTORY — DX: Atrial premature depolarization: I49.1

## 2013-06-17 HISTORY — DX: Other supraventricular tachycardia: I47.19

## 2013-06-17 NOTE — Progress Notes (Signed)
[facility]. [address], [address] [place], [place]  [address] Phone: [phone] Fax:  [phone]  Date:  06/17/2013   ID:  Jasmine Swanson, DOB [birth year], MRN [id number]  PCP:  No primary provider on file.   History of Present Illness: Jasmine Swanson is a 73 y.o. female with a hx of PAT and hypertension. She had multiple cardiac caths in 2008 that were normal and past nuclear stress test without ischemia and EF 52%.Has done failrly well with Lexapro, ativan. Panic attacks.  Walking 2 miles at the gym. Orthopedics issue.   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 She is not having dizziness/syncope nor chest pain. She feels she is very sensitive and intolerant of alot of medications. She also is aware that her anxiety needs to be treated supported by her daughter, who is also an Therapist, sports. She has not had further presyncope/weak spells as described to Dr Marlou Porch at last annual vist    Wt Readings from Last 3 Encounters:  06/17/13 131 lb (59.421 kg)  01/19/12 129 lb (58.514 kg)     Past Medical History  Diagnosis Date  . Hypertension   . Hyperlipidemia   . Dysphagia   . Bundle branch block, left   . Plantar fasciitis of right foot   . Kyphosis     Past Surgical History  Procedure Laterality Date  . Abdominal hysterectomy    . Tonsillectomy    . Cardiac catheterization      Current Outpatient Prescriptions  Medication Sig Dispense Refill  . acetaminophen (TYLENOL) 650 MG CR tablet Take 650 mg by mouth every 8 (eight) hours as needed. For pain      . aspirin 81 MG chewable tablet Chew 81 mg by mouth 3 (three) times a week. Mon-Wed-Fri      . Calcium Carb-Cholecalciferol (CALCIUM-VITAMIN D) 600-400 MG-UNIT TABS Take 1 tablet by mouth daily.      . calcium carbonate (TUMS - DOSED IN MG ELEMENTAL CALCIUM) 500 MG chewable tablet Chew 1 tablet by mouth daily as needed. For upset stomach      . Capsaicin 0.025 % GEL Apply topically.      .  Cholecalciferol (VITAMIN D) 2000 UNITS CAPS Take 2 capsules by mouth daily.       Marland Kitchen escitalopram (LEXAPRO) 5 MG tablet       . fish oil-omega-3 fatty acids 1000 MG capsule Take 1 g by mouth 3 (three) times daily.      . Flaxseed, Linseed, (FLAXSEED OIL) 1000 MG CAPS Take 1,000 mg by mouth daily.      . Magnesium 250 MG TABS Take 1 tablet by mouth daily.      . metoprolol (TOPROL-XL) 200 MG 24 hr tablet Take 200 mg by mouth daily.      Marland Kitchen omeprazole (PRILOSEC) 20 MG capsule Take 20 mg by mouth every other day.      . pyridOXINE (VITAMIN B-6) 100 MG tablet Take 100 mg by mouth daily.      . simvastatin (ZOCOR) 80 MG tablet Take 80 mg by mouth daily.       Marland Kitchen VITAMIN A PO Take 5,000 Units by mouth 2 (two) times a week. Mon-Fri      . vitamin C (ASCORBIC ACID) 500 MG tablet Take 500 mg by mouth daily.       No current facility-administered medications for this visit.    Allergies:  Allergies  Allergen Reactions  . Benicar Hct [Olmesartan Medoxomil-Hctz]   . Codeine Other (See Comments)    Drops blood pressure  . Diovan [Valsartan] Other (See Comments)    Palpitations.    . Niacin And Related Other (See Comments)    Turns patient red, makes her pass out  . Solu-Medrol [Methylprednisolone]     Increased HR and shaking all over     Social History:  The patient  reports that she has quit smoking. She does not have any smokeless tobacco history on file. She reports that she drinks alcohol. She reports that she does not use illicit drugs.   ROS:  Please see the history of present illness.   Denies any CP, bleeding, orthopnea, PND  PHYSICAL EXAM: VS:  BP 209/84  Pulse 110  Ht 5\' 4"  (1.626 m)  Wt 131 lb (59.421 kg)  BMI 22.47 kg/m2 Well nourished, well developed, in no acute distress HEENT: normal Neck: no JVD Cardiac:  normal S1, S2; RRR; no murmur Lungs:  clear to auscultation bilaterally, no wheezing, rhonchi or rales Abd: soft, nontender, no hepatomegaly Ext: no edema Skin: warm  and dry Neuro: no focal abnormalities noted  EKG:  Sinus rhythm, PAC, 65  ASSESSMENT AND PLAN:  1. White coat hypertension - blood pressures normally under control at Dr. Guido Sander office. Continue to monitor. 2. PAC's seen on EKG today. Continue to monitor. Benign. 3. PAT/paroxysmal atrial tachycardia-on metoprolol. Well controlled. No prolonged episodes. 4. Hyperlipidemia - been on simvastatin 80 for long time. Continue. Lipid are being checked at Dr. Guido Sander office.  Signed, Candee Furbish, MD [facility]  06/17/2013 3:51 PM

## 2013-06-17 NOTE — Patient Instructions (Signed)
Your physician recommends that you continue on your current medications as directed. Please refer to the Current Medication list given to you today.  Your physician wants you to follow-up in: 1 year with Dr. Skains. You will receive a reminder letter in the mail two months in advance. If you don't receive a letter, please call our office to schedule the follow-up appointment.  

## 2013-12-30 ENCOUNTER — Other Ambulatory Visit (HOSPITAL_COMMUNITY): Payer: Self-pay | Admitting: Family Medicine

## 2013-12-30 DIAGNOSIS — Z1231 Encounter for screening mammogram for malignant neoplasm of breast: Secondary | ICD-10-CM

## 2014-02-03 ENCOUNTER — Ambulatory Visit (HOSPITAL_COMMUNITY)
Admission: RE | Admit: 2014-02-03 | Discharge: 2014-02-03 | Disposition: A | Discharge status: Home / Self Care | Payer: Medicare Other | Source: Ambulatory Visit | Attending: Family Medicine | Admitting: Family Medicine

## 2014-02-03 DIAGNOSIS — Z1231 Encounter for screening mammogram for malignant neoplasm of breast: Secondary | ICD-10-CM | POA: Diagnosis present

## 2015-01-08 ENCOUNTER — Other Ambulatory Visit (HOSPITAL_COMMUNITY): Payer: Self-pay | Admitting: Family Medicine

## 2015-01-08 DIAGNOSIS — Z1231 Encounter for screening mammogram for malignant neoplasm of breast: Secondary | ICD-10-CM

## 2015-02-07 ENCOUNTER — Ambulatory Visit (HOSPITAL_COMMUNITY)
Admission: RE | Admit: 2015-02-07 | Discharge: 2015-02-07 | Disposition: A | Discharge status: Home / Self Care | Payer: Medicare Other | Source: Ambulatory Visit | Attending: Family Medicine | Admitting: Family Medicine

## 2015-02-07 DIAGNOSIS — Z1231 Encounter for screening mammogram for malignant neoplasm of breast: Secondary | ICD-10-CM

## 2015-07-10 DIAGNOSIS — Z85828 Personal history of other malignant neoplasm of skin: Secondary | ICD-10-CM | POA: Diagnosis not present

## 2015-07-10 DIAGNOSIS — C44319 Basal cell carcinoma of skin of other parts of face: Secondary | ICD-10-CM | POA: Diagnosis not present

## 2015-07-17 DIAGNOSIS — C44319 Basal cell carcinoma of skin of other parts of face: Secondary | ICD-10-CM | POA: Diagnosis not present

## 2015-07-17 DIAGNOSIS — Z85828 Personal history of other malignant neoplasm of skin: Secondary | ICD-10-CM | POA: Diagnosis not present

## 2015-07-26 DIAGNOSIS — K219 Gastro-esophageal reflux disease without esophagitis: Secondary | ICD-10-CM | POA: Diagnosis not present

## 2015-07-26 DIAGNOSIS — M899 Disorder of bone, unspecified: Secondary | ICD-10-CM | POA: Diagnosis not present

## 2015-07-26 DIAGNOSIS — F411 Generalized anxiety disorder: Secondary | ICD-10-CM | POA: Diagnosis not present

## 2015-07-26 DIAGNOSIS — M85851 Other specified disorders of bone density and structure, right thigh: Secondary | ICD-10-CM | POA: Diagnosis not present

## 2015-07-26 DIAGNOSIS — I10 Essential (primary) hypertension: Secondary | ICD-10-CM | POA: Diagnosis not present

## 2015-07-26 DIAGNOSIS — E782 Mixed hyperlipidemia: Secondary | ICD-10-CM | POA: Diagnosis not present

## 2015-09-17 DIAGNOSIS — Z1211 Encounter for screening for malignant neoplasm of colon: Secondary | ICD-10-CM | POA: Diagnosis not present

## 2015-09-17 DIAGNOSIS — Z800 Family history of malignant neoplasm of digestive organs: Secondary | ICD-10-CM | POA: Diagnosis not present

## 2015-09-17 DIAGNOSIS — Z86010 Personal history of colonic polyps: Secondary | ICD-10-CM | POA: Diagnosis not present

## 2016-01-07 DIAGNOSIS — H35033 Hypertensive retinopathy, bilateral: Secondary | ICD-10-CM | POA: Diagnosis not present

## 2016-01-07 DIAGNOSIS — H524 Presbyopia: Secondary | ICD-10-CM | POA: Diagnosis not present

## 2016-01-08 ENCOUNTER — Other Ambulatory Visit: Payer: Self-pay | Admitting: Family Medicine

## 2016-01-08 DIAGNOSIS — Z1231 Encounter for screening mammogram for malignant neoplasm of breast: Secondary | ICD-10-CM

## 2016-01-24 DIAGNOSIS — N951 Menopausal and female climacteric states: Secondary | ICD-10-CM | POA: Diagnosis not present

## 2016-01-24 DIAGNOSIS — I447 Left bundle-branch block, unspecified: Secondary | ICD-10-CM | POA: Diagnosis not present

## 2016-01-24 DIAGNOSIS — I10 Essential (primary) hypertension: Secondary | ICD-10-CM | POA: Diagnosis not present

## 2016-01-24 DIAGNOSIS — K219 Gastro-esophageal reflux disease without esophagitis: Secondary | ICD-10-CM | POA: Diagnosis not present

## 2016-01-24 DIAGNOSIS — F411 Generalized anxiety disorder: Secondary | ICD-10-CM | POA: Diagnosis not present

## 2016-01-24 DIAGNOSIS — Z86010 Personal history of colonic polyps: Secondary | ICD-10-CM | POA: Diagnosis not present

## 2016-01-24 DIAGNOSIS — M85851 Other specified disorders of bone density and structure, right thigh: Secondary | ICD-10-CM | POA: Diagnosis not present

## 2016-01-24 DIAGNOSIS — E782 Mixed hyperlipidemia: Secondary | ICD-10-CM | POA: Diagnosis not present

## 2016-01-24 DIAGNOSIS — M545 Low back pain: Secondary | ICD-10-CM | POA: Diagnosis not present

## 2016-01-24 DIAGNOSIS — Z0000 Encounter for general adult medical examination without abnormal findings: Secondary | ICD-10-CM | POA: Diagnosis not present

## 2016-01-29 DIAGNOSIS — K219 Gastro-esophageal reflux disease without esophagitis: Secondary | ICD-10-CM | POA: Diagnosis not present

## 2016-01-29 DIAGNOSIS — R1310 Dysphagia, unspecified: Secondary | ICD-10-CM | POA: Diagnosis not present

## 2016-01-29 DIAGNOSIS — G8929 Other chronic pain: Secondary | ICD-10-CM | POA: Diagnosis not present

## 2016-01-29 DIAGNOSIS — Z23 Encounter for immunization: Secondary | ICD-10-CM | POA: Diagnosis not present

## 2016-01-29 DIAGNOSIS — Z1389 Encounter for screening for other disorder: Secondary | ICD-10-CM | POA: Diagnosis not present

## 2016-01-29 DIAGNOSIS — Z0000 Encounter for general adult medical examination without abnormal findings: Secondary | ICD-10-CM | POA: Diagnosis not present

## 2016-01-29 DIAGNOSIS — M25539 Pain in unspecified wrist: Secondary | ICD-10-CM | POA: Diagnosis not present

## 2016-01-29 DIAGNOSIS — I10 Essential (primary) hypertension: Secondary | ICD-10-CM | POA: Diagnosis not present

## 2016-01-29 DIAGNOSIS — M150 Primary generalized (osteo)arthritis: Secondary | ICD-10-CM | POA: Diagnosis not present

## 2016-01-29 DIAGNOSIS — E782 Mixed hyperlipidemia: Secondary | ICD-10-CM | POA: Diagnosis not present

## 2016-02-01 DIAGNOSIS — M436 Torticollis: Secondary | ICD-10-CM | POA: Diagnosis not present

## 2016-02-04 DIAGNOSIS — M7501 Adhesive capsulitis of right shoulder: Secondary | ICD-10-CM | POA: Diagnosis not present

## 2016-02-09 DIAGNOSIS — M25511 Pain in right shoulder: Secondary | ICD-10-CM | POA: Diagnosis not present

## 2016-02-13 ENCOUNTER — Ambulatory Visit
Admission: RE | Admit: 2016-02-13 | Discharge: 2016-02-13 | Disposition: A | Discharge status: Home / Self Care | Payer: Medicare Other | Source: Ambulatory Visit | Attending: Family Medicine | Admitting: Family Medicine

## 2016-02-13 ENCOUNTER — Ambulatory Visit: Payer: Medicare Other

## 2016-02-13 DIAGNOSIS — Z1231 Encounter for screening mammogram for malignant neoplasm of breast: Secondary | ICD-10-CM | POA: Diagnosis not present

## 2016-02-27 DIAGNOSIS — M19011 Primary osteoarthritis, right shoulder: Secondary | ICD-10-CM | POA: Diagnosis not present

## 2016-03-14 DIAGNOSIS — M5030 Other cervical disc degeneration, unspecified cervical region: Secondary | ICD-10-CM | POA: Diagnosis not present

## 2016-03-25 DIAGNOSIS — M542 Cervicalgia: Secondary | ICD-10-CM | POA: Diagnosis not present

## 2016-04-01 DIAGNOSIS — M5481 Occipital neuralgia: Secondary | ICD-10-CM | POA: Diagnosis not present

## 2016-04-01 DIAGNOSIS — M542 Cervicalgia: Secondary | ICD-10-CM | POA: Diagnosis not present

## 2016-04-01 DIAGNOSIS — M47812 Spondylosis without myelopathy or radiculopathy, cervical region: Secondary | ICD-10-CM | POA: Diagnosis not present

## 2016-08-04 DIAGNOSIS — I10 Essential (primary) hypertension: Secondary | ICD-10-CM | POA: Diagnosis not present

## 2016-08-04 DIAGNOSIS — K219 Gastro-esophageal reflux disease without esophagitis: Secondary | ICD-10-CM | POA: Diagnosis not present

## 2016-08-04 DIAGNOSIS — E782 Mixed hyperlipidemia: Secondary | ICD-10-CM | POA: Diagnosis not present

## 2016-08-04 DIAGNOSIS — F411 Generalized anxiety disorder: Secondary | ICD-10-CM | POA: Diagnosis not present

## 2016-09-04 DIAGNOSIS — L814 Other melanin hyperpigmentation: Secondary | ICD-10-CM | POA: Diagnosis not present

## 2016-09-04 DIAGNOSIS — D225 Melanocytic nevi of trunk: Secondary | ICD-10-CM | POA: Diagnosis not present

## 2016-09-04 DIAGNOSIS — Z85828 Personal history of other malignant neoplasm of skin: Secondary | ICD-10-CM | POA: Diagnosis not present

## 2016-09-04 DIAGNOSIS — L570 Actinic keratosis: Secondary | ICD-10-CM | POA: Diagnosis not present

## 2017-01-14 ENCOUNTER — Other Ambulatory Visit: Payer: Self-pay | Admitting: Family Medicine

## 2017-01-14 DIAGNOSIS — Z1231 Encounter for screening mammogram for malignant neoplasm of breast: Secondary | ICD-10-CM

## 2017-02-25 ENCOUNTER — Ambulatory Visit: Payer: Medicare Other

## 2017-02-25 DIAGNOSIS — E782 Mixed hyperlipidemia: Secondary | ICD-10-CM | POA: Diagnosis not present

## 2017-02-25 DIAGNOSIS — I10 Essential (primary) hypertension: Secondary | ICD-10-CM | POA: Diagnosis not present

## 2017-02-25 DIAGNOSIS — M85851 Other specified disorders of bone density and structure, right thigh: Secondary | ICD-10-CM | POA: Diagnosis not present

## 2017-02-27 DIAGNOSIS — Z0000 Encounter for general adult medical examination without abnormal findings: Secondary | ICD-10-CM | POA: Diagnosis not present

## 2017-02-27 DIAGNOSIS — Z1389 Encounter for screening for other disorder: Secondary | ICD-10-CM | POA: Diagnosis not present

## 2017-02-27 DIAGNOSIS — Z23 Encounter for immunization: Secondary | ICD-10-CM | POA: Diagnosis not present

## 2017-02-27 DIAGNOSIS — I10 Essential (primary) hypertension: Secondary | ICD-10-CM | POA: Diagnosis not present

## 2017-03-04 ENCOUNTER — Ambulatory Visit: Payer: Medicare Other

## 2017-03-24 ENCOUNTER — Ambulatory Visit
Admission: RE | Admit: 2017-03-24 | Discharge: 2017-03-24 | Disposition: A | Discharge status: Home / Self Care | Payer: Medicare Other | Source: Ambulatory Visit | Attending: Family Medicine | Admitting: Family Medicine

## 2017-03-24 DIAGNOSIS — Z1231 Encounter for screening mammogram for malignant neoplasm of breast: Secondary | ICD-10-CM

## 2017-04-02 DIAGNOSIS — J040 Acute laryngitis: Secondary | ICD-10-CM | POA: Diagnosis not present

## 2017-04-02 DIAGNOSIS — J209 Acute bronchitis, unspecified: Secondary | ICD-10-CM | POA: Diagnosis not present

## 2017-04-28 DIAGNOSIS — M8588 Other specified disorders of bone density and structure, other site: Secondary | ICD-10-CM | POA: Diagnosis not present

## 2017-08-27 DIAGNOSIS — Z0000 Encounter for general adult medical examination without abnormal findings: Secondary | ICD-10-CM | POA: Diagnosis not present

## 2017-08-27 DIAGNOSIS — Z1389 Encounter for screening for other disorder: Secondary | ICD-10-CM | POA: Diagnosis not present

## 2017-08-27 DIAGNOSIS — I10 Essential (primary) hypertension: Secondary | ICD-10-CM | POA: Diagnosis not present

## 2017-08-27 DIAGNOSIS — E782 Mixed hyperlipidemia: Secondary | ICD-10-CM | POA: Diagnosis not present

## 2017-08-31 DIAGNOSIS — I10 Essential (primary) hypertension: Secondary | ICD-10-CM | POA: Diagnosis not present

## 2017-08-31 DIAGNOSIS — E782 Mixed hyperlipidemia: Secondary | ICD-10-CM | POA: Diagnosis not present

## 2017-08-31 DIAGNOSIS — K219 Gastro-esophageal reflux disease without esophagitis: Secondary | ICD-10-CM | POA: Diagnosis not present

## 2017-08-31 DIAGNOSIS — R432 Parageusia: Secondary | ICD-10-CM | POA: Diagnosis not present

## 2017-09-04 ENCOUNTER — Other Ambulatory Visit: Payer: Self-pay | Admitting: Orthopedic Surgery

## 2017-09-04 DIAGNOSIS — M542 Cervicalgia: Secondary | ICD-10-CM | POA: Diagnosis not present

## 2017-09-04 DIAGNOSIS — M25511 Pain in right shoulder: Secondary | ICD-10-CM

## 2017-09-04 DIAGNOSIS — M19011 Primary osteoarthritis, right shoulder: Secondary | ICD-10-CM | POA: Diagnosis not present

## 2017-09-14 ENCOUNTER — Ambulatory Visit
Admission: RE | Admit: 2017-09-14 | Discharge: 2017-09-14 | Disposition: A | Discharge status: Home / Self Care | Payer: Medicare Other | Source: Ambulatory Visit | Attending: Orthopedic Surgery | Admitting: Orthopedic Surgery

## 2017-09-14 DIAGNOSIS — M19011 Primary osteoarthritis, right shoulder: Secondary | ICD-10-CM | POA: Diagnosis not present

## 2017-09-14 DIAGNOSIS — M25511 Pain in right shoulder: Secondary | ICD-10-CM

## 2017-10-15 DIAGNOSIS — L814 Other melanin hyperpigmentation: Secondary | ICD-10-CM | POA: Diagnosis not present

## 2017-10-15 DIAGNOSIS — L821 Other seborrheic keratosis: Secondary | ICD-10-CM | POA: Diagnosis not present

## 2017-10-15 DIAGNOSIS — Z85828 Personal history of other malignant neoplasm of skin: Secondary | ICD-10-CM | POA: Diagnosis not present

## 2017-10-15 DIAGNOSIS — D1801 Hemangioma of skin and subcutaneous tissue: Secondary | ICD-10-CM | POA: Diagnosis not present

## 2017-10-16 DIAGNOSIS — Z01810 Encounter for preprocedural cardiovascular examination: Secondary | ICD-10-CM

## 2017-10-16 HISTORY — DX: Encounter for preprocedural cardiovascular examination: Z01.810

## 2017-10-21 NOTE — Progress Notes (Signed)
Cardiology Office Note:    Date:  10/23/2017   ID:  Jasmine Swanson, DOB 1940-12-15, MRN [id number]  PCP:  Cari Caraway, MD  Cardiologist:  Shirlee More, MD   Referring MD: Cari Caraway, MD  ASSESSMENT:    1. Preoperative cardiovascular examination   2. PAT (paroxysmal atrial tachycardia) ([place])   3. Essential hypertension    PLAN:    In order of problems listed above:  Preoperative cardiovascular evaluation summary  Surgeon: Marchia Bond, MD Procedure:RIGHT TOTAL SHOULDER ARTHROPLASTY The surgery is elective Active cardiac problems stable atrial arrhythmia and hypertension continue beta-blocker.  The cardiac status is stable. The planned procedure is intermediate risk. The cardiac risk factors are well-controlled hypertension and arrhythmia The functional capacity is 4 mets or greater yes Recent cardiac tests performed EKG today with incomplete left bundle branch block stable Given the above his overall risk for the planned procedure is acceptable/low Antiplatelet/ anticoagulant recommendation: Treatment withdrawal aspirin perioperatively Other cardiac medication or device recommendation: None Anesthesia recommendation: None Observation, monitoring,and postoperative test recommendation: Monitor and observation on telemetry check EKG postoperative day 1 The patient is optimized from a cardiology perspective: Yes  1. The above she is optimized for planned surgery repair is no further cardiology evaluation 2. Well controlled continue beta-blocker 3. Well-controlled continue beta-blocker  Next appointment as needed, any further assistance while in the hospital please contact cardiology [facility]   Medication Adjustments/Labs and Tests Ordered: Current medicines are reviewed at length with the patient today.  Concerns regarding medicines are outlined above.  No orders of the defined types were placed in this encounter.  No orders of the defined  types were placed in this encounter.    No chief complaint on file.   History of Present Illness:    Jasmine Swanson is a 77 y.o. female who is being seen today for preoperative  evaluation at the request of Jasmine Deed MD. She was seen by Dr Marlou Porch 06/17/13 for hypertension and PAT. she underwent evaluation resulting in normal coronary arteriography in 2012 she is active she dances and if not for shoulder pain she be pleased with the quality of her life she has no exercise intolerance chest pain dyspnea palpitation or syncope.  In retrospect she thinks much of her palpitation and apparent atrial arrhythmia was due to anxiety and panic has been alleviated with appropriate medical therapy.  EKG:  Sinus rhythm, PAC, 65  ASSESSMENT AND PLAN:  1. White coat hypertension - blood pressures normally under control at Dr. Guido Sander office. Continue to monitor. 2. PAC's seen on EKG today. Continue to monitor. Benign. 3. PAT/paroxysmal atrial tachycardia-on metoprolol. Well controlled. No prolonged episodes. 4. Hyperlipidemia - been on simvastatin 80 for long time. Continue. Lipid are being checked at Dr. Guido Sander office. Signed, Jasmine Furbish, MD [facility]  06/17/2013 3:51 PM   Past Medical History:  Diagnosis Date  . Bundle branch block, left   . Dysphagia   . Essential hypertension 06/17/2013  . Hyperlipidemia   . Hypertension   . Kyphosis   . PAC (premature atrial contraction) 06/17/2013  . PAT (paroxysmal atrial tachycardia) ([place]) 06/17/2013  . Plantar fasciitis of right foot   . Preop cardiovascular exam 10/16/2017  . Right shoulder pain 01/20/2012    Past Surgical History:  Procedure Laterality Date  . ABDOMINAL HYSTERECTOMY    . CARDIAC CATHETERIZATION    . TONSILLECTOMY      Current Medications: Current Meds  Medication Sig  . acetaminophen (TYLENOL) 650  MG CR tablet Take 650 mg by mouth every 8 (eight) hours as needed. For pain  . aspirin 81 MG chewable tablet Chew 81 mg by  mouth 3 (three) times a week. Mon-Wed-Fri  . Calcium Carb-Cholecalciferol (CALCIUM-VITAMIN D) 600-400 MG-UNIT TABS Take 1 tablet by mouth daily.  . calcium carbonate (TUMS - DOSED IN MG ELEMENTAL CALCIUM) 500 MG chewable tablet Chew 1 tablet by mouth daily as needed. For upset stomach  . Cholecalciferol (VITAMIN D) 2000 UNITS CAPS Take 2 capsules by mouth daily.   Marland Kitchen escitalopram (LEXAPRO) 10 MG tablet Take 10 mg by mouth daily.   . fish oil-omega-3 fatty acids 1000 MG capsule Take 1 g by mouth 3 (three) times daily.  . Flaxseed, Linseed, (FLAXSEED OIL) 1000 MG CAPS Take 1,000 mg by mouth daily.  . Magnesium 250 MG TABS Take 1 tablet by mouth daily.  . metoprolol (TOPROL-XL) 200 MG 24 hr tablet Take 200 mg by mouth daily.  Marland Kitchen omeprazole (PRILOSEC) 20 MG capsule Take 20 mg by mouth every other day.  . pyridOXINE (VITAMIN B-6) 100 MG tablet Take 100 mg by mouth daily.  . rosuvastatin (CRESTOR) 20 MG tablet Take 20 mg by mouth daily.  . vitamin C (ASCORBIC ACID) 500 MG tablet Take 1,000 mg by mouth daily.      Allergies:   Benicar hct [olmesartan medoxomil-hctz]; Codeine; Diovan [valsartan]; Niacin and related; and Solu-medrol [methylprednisolone]   Social History   Socioeconomic History  . Marital status: Divorced    Spouse name: Not on file  . Number of children: Not on file  . Years of education: Not on file  . Highest education level: Not on file  Occupational History  . Not on file  Social Needs  . Financial resource strain: Not on file  . Food insecurity:    Worry: Not on file    Inability: Not on file  . Transportation needs:    Medical: Not on file    Non-medical: Not on file  Tobacco Use  . Smoking status: Former Research scientist (life sciences)  . Smokeless tobacco: Never Used  Substance and Sexual Activity  . Alcohol use: Yes  . Drug use: No  . Sexual activity: Not on file  Lifestyle  . Physical activity:    Days per week: Not on file    Minutes per session: Not on file  . Stress: Not on  file  Relationships  . Social connections:    Talks on phone: Not on file    Gets together: Not on file    Attends religious service: Not on file    Active member of club or organization: Not on file    Attends meetings of clubs or organizations: Not on file    Relationship status: Not on file  Other Topics Concern  . Not on file  Social History Narrative  . Not on file     Family History: The patient's family history includes Cancer - Colon in her father; Heart attack in her mother; Hyperlipidemia in her mother; Hypertension in her mother. There is no history of Sudden death or Diabetes.  ROS:   ROS Please see the history of present illness.     All other systems reviewed and are negative.  EKGs/Labs/Other Studies Reviewed:    The following studies were reviewed today:   EKG:  EKG is  ordered today.  The ekg ordered today demonstrates sinus rhythm incomplete left bundle branch block  Left heart cath 09/25/2010:  RESULTS:  1. Pressures:  Left ventricular pressure was 170/77, mean 120.  Later      during the case, the LV pressure dropped to 145/60 with a mean of      100.  There was no significant aortic valve gradient by pull-back      technique.  2. Angiographic results:  The patient's coronary arteries were      angiographically patent.  No lesions were seen throughout.  Echo 12/12/2004: SUMMARY - Overall left ventricular systolic function was normal. Left    ventricular ejection fraction was estimated , range being 55    % to 65 %.. There were no left ventricular regional wall    motion abnormalities. --------------------------------------------------------------- Prepared and Electronically Authenticated by Odette Fraction M.D. Confirmed 12-Dec-2004 16:49:08  Recent Labs: No results found for requested labs within last 8760 hours.  Recent Lipid Panel No results found for: CHOL, TRIG, HDL, CHOLHDL, VLDL, LDLCALC, LDLDIRECT  Physical Exam:     VS:  Ht 5\' 4"  (1.626 m)   Wt 133 lb (60.3 kg)   BMI 22.83 kg/m     Wt Readings from Last 3 Encounters:  10/23/17 133 lb (60.3 kg)  06/17/13 131 lb (59.4 kg)  01/19/12 129 lb (58.5 kg)     GEN:  Well nourished, well developed in no acute distress HEENT: Normal NECK: No JVD; No carotid bruits LYMPHATICS: No lymphadenopathy CARDIAC: RRR, no murmurs, rubs, gallops RESPIRATORY:  Clear to auscultation without rales, wheezing or rhonchi  ABDOMEN: Soft, non-tender, non-distended MUSCULOSKELETAL:  No edema; No deformity  SKIN: Warm and dry NEUROLOGIC:  Alert and oriented x 3 PSYCHIATRIC:  Normal affect     Signed, Shirlee More, MD  10/23/2017 1:51 PM    [facility]

## 2017-10-23 ENCOUNTER — Encounter: Payer: Self-pay | Admitting: Cardiology

## 2017-10-23 ENCOUNTER — Ambulatory Visit: Payer: Medicare Other | Admitting: Cardiology

## 2017-10-23 VITALS — BP 126/70 | HR 59 | Ht 64.0 in | Wt 133.0 lb

## 2017-10-23 DIAGNOSIS — I10 Essential (primary) hypertension: Secondary | ICD-10-CM

## 2017-10-23 DIAGNOSIS — I471 Supraventricular tachycardia: Secondary | ICD-10-CM | POA: Diagnosis not present

## 2017-10-23 DIAGNOSIS — Z01810 Encounter for preprocedural cardiovascular examination: Secondary | ICD-10-CM

## 2017-10-23 NOTE — Patient Instructions (Signed)
Medication Instructions:  Your physician recommends that you continue on your current medications as directed. Please refer to the Current Medication list given to you today.   Labwork: NONE  Testing/Procedures: You had an EKG today.  Follow-Up: Your physician recommends that you schedule a follow-up appointment as needed or if symptoms worsen or fail to improve.   Any Other Special Instructions Will Be Listed Below (If Applicable).     If you need a refill on your cardiac medications before your next appointment, please call your pharmacy.   

## 2017-10-28 ENCOUNTER — Other Ambulatory Visit: Payer: Self-pay | Admitting: Orthopedic Surgery

## 2017-10-30 ENCOUNTER — Encounter (HOSPITAL_COMMUNITY)
Admission: RE | Admit: 2017-10-30 | Discharge: 2017-10-30 | Disposition: A | Discharge status: Home / Self Care | Payer: Medicare Other | Source: Ambulatory Visit | Attending: Orthopedic Surgery | Admitting: Orthopedic Surgery

## 2017-10-30 ENCOUNTER — Encounter (HOSPITAL_COMMUNITY): Payer: Self-pay

## 2017-10-30 DIAGNOSIS — Z79899 Other long term (current) drug therapy: Secondary | ICD-10-CM | POA: Diagnosis not present

## 2017-10-30 DIAGNOSIS — Z7982 Long term (current) use of aspirin: Secondary | ICD-10-CM | POA: Diagnosis not present

## 2017-10-30 DIAGNOSIS — Z01812 Encounter for preprocedural laboratory examination: Secondary | ICD-10-CM | POA: Diagnosis present

## 2017-10-30 DIAGNOSIS — I471 Supraventricular tachycardia: Secondary | ICD-10-CM | POA: Diagnosis not present

## 2017-10-30 DIAGNOSIS — E785 Hyperlipidemia, unspecified: Secondary | ICD-10-CM | POA: Diagnosis not present

## 2017-10-30 DIAGNOSIS — I10 Essential (primary) hypertension: Secondary | ICD-10-CM | POA: Insufficient documentation

## 2017-10-30 DIAGNOSIS — M19011 Primary osteoarthritis, right shoulder: Secondary | ICD-10-CM | POA: Insufficient documentation

## 2017-10-30 HISTORY — DX: Anxiety disorder, unspecified: F41.9

## 2017-10-30 HISTORY — DX: Gastro-esophageal reflux disease without esophagitis: K21.9

## 2017-10-30 HISTORY — DX: Adverse effect of unspecified anesthetic, initial encounter: T41.45XA

## 2017-10-30 HISTORY — DX: Other complications of anesthesia, initial encounter: T88.59XA

## 2017-10-30 HISTORY — DX: Major depressive disorder, single episode, unspecified: F32.9

## 2017-10-30 HISTORY — DX: Unspecified osteoarthritis, unspecified site: M19.90

## 2017-10-30 HISTORY — DX: Depression, unspecified: F32.A

## 2017-10-30 LAB — BASIC METABOLIC PANEL
Anion gap: 10 (ref 5–15)
BUN: 9 mg/dL (ref 6–20)
CO2: 25 mmol/L (ref 22–32)
Calcium: 9.3 mg/dL (ref 8.9–10.3)
Chloride: 104 mmol/L (ref 101–111)
Creatinine, Ser: 0.74 mg/dL (ref 0.44–1.00)
GFR calc Af Amer: >60 mL/min (ref >60)
GLUCOSE: 101 mg/dL — AB (ref 65–99)
POTASSIUM: 4.3 mmol/L (ref 3.5–5.1)
Sodium: 139 mmol/L (ref 135–145)

## 2017-10-30 LAB — CBC
HCT: 39.7 % (ref 36.0–46.0)
Hemoglobin: 13.5 g/dL (ref 12.0–15.0)
MCH: 31.3 pg (ref 26.0–34.0)
MCHC: 34 g/dL (ref 30.0–36.0)
MCV: 91.9 fL (ref 78.0–100.0)
PLATELETS: 170 10*3/uL (ref 150–400)
RBC: 4.32 MIL/uL (ref 3.87–5.11)
RDW: 11.9 % (ref 11.5–15.5)
WBC: 5.5 10*3/uL (ref 4.0–10.5)

## 2017-10-30 LAB — SURGICAL PCR SCREEN
MRSA, PCR: NEGATIVE
Staphylococcus aureus: NEGATIVE

## 2017-10-30 NOTE — Progress Notes (Signed)
Anesthesia Chart Review:   Case:  [id number] Date/Time:  11/10/17 0715   Procedure:  RIGHT TOTAL SHOULDER ARTHROPLASTY (Right Shoulder)   Anesthesia type:  Choice   Pre-op diagnosis:  djd right shoulder   Location:  [facility] OR ROOM 04 / [facility]   Surgeon:  Marchia Bond, MD      DISCUSSION: - Pt is a 77 year old female with hx paroxysmal atrial tachycardia, LBBB, HTN  - Anesthesia history: pt reports laryngeal spasms after anesthesia 50 years ago   VS: Resp 18   Ht 5' 2.5" (1.588 m)   Wt 134 lb 6.4 oz (61 kg)   BMI 24.19 kg/m   - BP 126/70, HR 59 at office visit 10/23/17   PROVIDERS: PCP is Cari Caraway, MD  Saw cardiologist Shirlee More, MD for pre-op eval 10/23/17; pt cleared for surgery   LABS: Labs reviewed: Acceptable for surgery. (all labs ordered are listed, but only abnormal results are displayed)  Labs Reviewed  BASIC METABOLIC PANEL - Abnormal; Notable for the following components:      Result Value   Glucose, Bld 101 (*)    All other components within normal limits  SURGICAL PCR SCREEN  CBC    EKG 10/23/17: sinus bradycardia (57 bpm). Incomplete LBBB.    CV:  Left heart cath 09/25/10: 1. Pressures: Left ventricular pressure was 170/77, mean 120. Later during the case, the LV pressure dropped to 145/60 with a mean of 100. There was no significant aortic valve gradient by pull-back technique. 2. Angiographic results: The patient's coronary arteries were angiographically patent. No lesions were seen throughout.  Echo 12/12/04: - Overall left ventricular systolic function was normal. Left ventricular ejection fraction was estimated , range being 55% to 65 %.. There were no left ventricular regional wall motion abnormalities.   Past Medical History:  Diagnosis Date  . Anxiety   . Arthritis   . Bundle branch block, left   . Complication of anesthesia    laryngeal spams 50 yrs ago  . Depression   . Dysphagia   . Essential  hypertension 06/17/2013  . GERD (gastroesophageal reflux disease)   . Hyperlipidemia   . Hypertension   . Kyphosis   . PAC (premature atrial contraction) 06/17/2013  . PAT (paroxysmal atrial tachycardia) ([place]) 06/17/2013  . Plantar fasciitis of right foot   . Preop cardiovascular exam 10/16/2017  . Right shoulder pain 01/20/2012    Past Surgical History:  Procedure Laterality Date  . ABDOMINAL HYSTERECTOMY    . bladder tac    . CARDIAC CATHETERIZATION    . TONSILLECTOMY      MEDICATIONS: . acetaminophen (TYLENOL) 650 MG CR tablet  . Ascorbic Acid (VITAMIN C) 1000 MG tablet  . aspirin EC 81 MG tablet  . Calcium Carb-Cholecalciferol (CALCIUM-VITAMIN D) 600-400 MG-UNIT TABS  . calcium carbonate (TUMS - DOSED IN MG ELEMENTAL CALCIUM) 500 MG chewable tablet  . Cholecalciferol 4000 units CAPS  . diphenhydrAMINE (BENADRYL) 25 MG tablet  . escitalopram (LEXAPRO) 10 MG tablet  . fish oil-omega-3 fatty acids 1000 MG capsule  . Flaxseed, Linseed, (FLAXSEED OIL) 1000 MG CAPS  . hydrocortisone cream 1 %  . LORazepam (ATIVAN) 0.5 MG tablet  . Magnesium 250 MG TABS  . metoprolol (TOPROL-XL) 200 MG 24 hr tablet  . omeprazole (PRILOSEC) 20 MG capsule  . pyridOXINE (VITAMIN B-6) 100 MG tablet  . rosuvastatin (CRESTOR) 20 MG tablet   No current facility-administered medications for this encounter.     If  no changes, I anticipate pt can proceed with surgery as scheduled.   Willeen Cass, FNP-BC [facility] Stay [facility]/Anesthesiology Phone: [phone] 10/30/2017 3:56 PM

## 2017-10-30 NOTE — Pre-Procedure Instructions (Signed)
    Jasmine Swanson  10/30/2017      [facility] (MAIL ORDER) [facility] - [place] - [address] [place] [address] [place] [address] Phone: [phone] Fax: [phone]  [facility] [address] [place], [place] [address] [place] [address] Phone: [phone] Fax: [phone]    Your procedure is scheduled on 11/10/17.  Report to [facility] Admitting at 530 A.M.  Call this number if you have problems the morning of surgery:  [phone]   Remember:  No food after midnight.  Y Take these medicines the morning of surgery with A SIP OF WATER ---tylenol,lexapro,metoprolol,prilosec,    Do not wear jewelry, make-up or nail polish.  Do not wear lotions, powders, or perfumes, or deodorant.  Do not shave 48 hours prior to surgery.  Men may shave face and neck.  Do not bring valuables to the hospital.  [facility] is not responsible for any belongings or valuables.  Contacts, dentures or bridgework may not be worn into surgery.  Leave your suitcase in the car.  After surgery it may be brought to your room.  For patients admitted to the hospital, discharge time will be determined by your treatment team.  Patients discharged the day of surgery will not be allowed to drive home.   Name and phone number of your driver:    Special instructions:  Do not take any aspirin,anti-inflammatories,vitamins,or herbal supplements 5-7 days prior to surgery.  Please read over the following fact sheets that you were given. MRSA Information

## 2017-11-10 ENCOUNTER — Inpatient Hospital Stay (HOSPITAL_COMMUNITY): Payer: Medicare Other | Admitting: Certified Registered Nurse Anesthetist

## 2017-11-10 ENCOUNTER — Inpatient Hospital Stay (HOSPITAL_COMMUNITY): Payer: Medicare Other

## 2017-11-10 ENCOUNTER — Inpatient Hospital Stay (HOSPITAL_COMMUNITY): Payer: Medicare Other | Admitting: Emergency Medicine

## 2017-11-10 ENCOUNTER — Encounter (HOSPITAL_COMMUNITY)
Admission: RE | Disposition: A | Discharge status: Home / Self Care | Payer: Self-pay | Source: Ambulatory Visit | Attending: Internal Medicine

## 2017-11-10 ENCOUNTER — Encounter (HOSPITAL_COMMUNITY): Payer: Self-pay | Admitting: *Deleted

## 2017-11-10 ENCOUNTER — Inpatient Hospital Stay (HOSPITAL_COMMUNITY): Payer: Medicare Other | Admitting: Certified Registered"

## 2017-11-10 ENCOUNTER — Inpatient Hospital Stay (HOSPITAL_COMMUNITY)
Admission: RE | Admit: 2017-11-10 | Discharge: 2017-11-11 | DRG: 483 | Disposition: A | Discharge status: Home / Self Care | Payer: Medicare Other | Source: Ambulatory Visit | Attending: Internal Medicine | Admitting: Internal Medicine

## 2017-11-10 DIAGNOSIS — Z5309 Procedure and treatment not carried out because of other contraindication: Secondary | ICD-10-CM | POA: Diagnosis not present

## 2017-11-10 DIAGNOSIS — Z7982 Long term (current) use of aspirin: Secondary | ICD-10-CM

## 2017-11-10 DIAGNOSIS — Z888 Allergy status to other drugs, medicaments and biological substances status: Secondary | ICD-10-CM | POA: Diagnosis not present

## 2017-11-10 DIAGNOSIS — Z79899 Other long term (current) drug therapy: Secondary | ICD-10-CM | POA: Diagnosis not present

## 2017-11-10 DIAGNOSIS — G8929 Other chronic pain: Secondary | ICD-10-CM | POA: Diagnosis not present

## 2017-11-10 DIAGNOSIS — K219 Gastro-esophageal reflux disease without esophagitis: Secondary | ICD-10-CM | POA: Diagnosis present

## 2017-11-10 DIAGNOSIS — Z96611 Presence of right artificial shoulder joint: Secondary | ICD-10-CM | POA: Diagnosis not present

## 2017-11-10 DIAGNOSIS — M25511 Pain in right shoulder: Secondary | ICD-10-CM | POA: Diagnosis not present

## 2017-11-10 DIAGNOSIS — I447 Left bundle-branch block, unspecified: Secondary | ICD-10-CM | POA: Diagnosis present

## 2017-11-10 DIAGNOSIS — R918 Other nonspecific abnormal finding of lung field: Secondary | ICD-10-CM | POA: Diagnosis not present

## 2017-11-10 DIAGNOSIS — J986 Disorders of diaphragm: Secondary | ICD-10-CM | POA: Diagnosis not present

## 2017-11-10 DIAGNOSIS — J9601 Acute respiratory failure with hypoxia: Secondary | ICD-10-CM | POA: Diagnosis not present

## 2017-11-10 DIAGNOSIS — R062 Wheezing: Secondary | ICD-10-CM

## 2017-11-10 DIAGNOSIS — M19011 Primary osteoarthritis, right shoulder: Secondary | ICD-10-CM | POA: Diagnosis not present

## 2017-11-10 DIAGNOSIS — G8918 Other acute postprocedural pain: Secondary | ICD-10-CM | POA: Diagnosis not present

## 2017-11-10 DIAGNOSIS — F419 Anxiety disorder, unspecified: Secondary | ICD-10-CM | POA: Diagnosis not present

## 2017-11-10 DIAGNOSIS — Z885 Allergy status to narcotic agent status: Secondary | ICD-10-CM | POA: Diagnosis not present

## 2017-11-10 DIAGNOSIS — I10 Essential (primary) hypertension: Secondary | ICD-10-CM | POA: Diagnosis not present

## 2017-11-10 DIAGNOSIS — R471 Dysarthria and anarthria: Secondary | ICD-10-CM | POA: Diagnosis not present

## 2017-11-10 DIAGNOSIS — E785 Hyperlipidemia, unspecified: Secondary | ICD-10-CM | POA: Diagnosis present

## 2017-11-10 DIAGNOSIS — J9600 Acute respiratory failure, unspecified whether with hypoxia or hypercapnia: Secondary | ICD-10-CM | POA: Diagnosis present

## 2017-11-10 DIAGNOSIS — F329 Major depressive disorder, single episode, unspecified: Secondary | ICD-10-CM | POA: Diagnosis present

## 2017-11-10 DIAGNOSIS — Z87891 Personal history of nicotine dependence: Secondary | ICD-10-CM

## 2017-11-10 HISTORY — PX: TOTAL SHOULDER ARTHROPLASTY: SHX126

## 2017-11-10 HISTORY — DX: Primary osteoarthritis, right shoulder: M19.011

## 2017-11-10 LAB — CREATININE, SERUM
CREATININE: 0.76 mg/dL (ref 0.44–1.00)
GFR calc Af Amer: >60 mL/min (ref >60)

## 2017-11-10 LAB — TROPONIN I: Troponin I: <0.03 ng/mL (ref <0.03)

## 2017-11-10 LAB — CBC
HCT: 37.1 % (ref 36.0–46.0)
HEMOGLOBIN: 12.2 g/dL (ref 12.0–15.0)
MCH: 30.5 pg (ref 26.0–34.0)
MCHC: 32.9 g/dL (ref 30.0–36.0)
MCV: 92.8 fL (ref 78.0–100.0)
Platelets: 149 10*3/uL — ABNORMAL LOW (ref 150–400)
RBC: 4 MIL/uL (ref 3.87–5.11)
RDW: 11.9 % (ref 11.5–15.5)
WBC: 7.2 10*3/uL (ref 4.0–10.5)

## 2017-11-10 SURGERY — ARTHROPLASTY, SHOULDER, TOTAL
Anesthesia: General | Site: Shoulder | Laterality: Right

## 2017-11-10 SURGERY — CANCELLED PROCEDURE
Anesthesia: Regional

## 2017-11-10 MED ORDER — LIDOCAINE 2% (20 MG/ML) 5 ML SYRINGE
INTRAMUSCULAR | Status: DC | PRN
  Administered 2017-11-10: 60 mg via INTRAVENOUS

## 2017-11-10 MED ORDER — ROSUVASTATIN CALCIUM 10 MG PO TABS
20.0000 mg | ORAL_TABLET | Freq: Every evening | ORAL | Status: DC
  Filled 2017-11-10: qty 1

## 2017-11-10 MED ORDER — CEFAZOLIN SODIUM-DEXTROSE 1-4 GM/50ML-% IV SOLN
1.0000 g | Freq: Four times a day (QID) | INTRAVENOUS | Status: DC
  Administered 2017-11-10 – 2017-11-11 (×2): 1 g via INTRAVENOUS
  Filled 2017-11-10 (×3): qty 50

## 2017-11-10 MED ORDER — TRAMADOL HCL 50 MG PO TABS: 50.0000 mg | ORAL_TABLET | Freq: Four times a day (QID) | ORAL | 0 refills | Status: DC | PRN

## 2017-11-10 MED ORDER — METOPROLOL SUCCINATE ER 100 MG PO TB24: 200.0000 mg | ORAL_TABLET | Freq: Every evening | ORAL | Status: DC

## 2017-11-10 MED ORDER — MAGNESIUM CITRATE PO SOLN: 1.0000 | Freq: Once | ORAL | Status: DC | PRN

## 2017-11-10 MED ORDER — SODIUM CHLORIDE 0.9 % IR SOLN
Status: DC | PRN
  Administered 2017-11-10: 1000 mL

## 2017-11-10 MED ORDER — METHOCARBAMOL 500 MG PO TABS: 500.0000 mg | ORAL_TABLET | Freq: Four times a day (QID) | ORAL | Status: DC | PRN

## 2017-11-10 MED ORDER — MAGNESIUM 250 MG PO TABS: 250.0000 mg | ORAL_TABLET | Freq: Every day | ORAL | Status: DC

## 2017-11-10 MED ORDER — MIDAZOLAM HCL 2 MG/2ML IJ SOLN
INTRAMUSCULAR | Status: AC
  Filled 2017-11-10: qty 2

## 2017-11-10 MED ORDER — DIPHENHYDRAMINE HCL 25 MG PO TABS: 25.0000 mg | ORAL_TABLET | Freq: Every day | ORAL | Status: DC | PRN

## 2017-11-10 MED ORDER — EPHEDRINE SULFATE 50 MG/ML IJ SOLN
INTRAMUSCULAR | Status: DC | PRN
  Administered 2017-11-10 (×2): 10 mg via INTRAVENOUS
  Administered 2017-11-10: 5 mg via INTRAVENOUS

## 2017-11-10 MED ORDER — CEFAZOLIN SODIUM-DEXTROSE 2-4 GM/100ML-% IV SOLN
INTRAVENOUS | Status: AC
  Filled 2017-11-10: qty 100

## 2017-11-10 MED ORDER — 0.9 % SODIUM CHLORIDE (POUR BTL) OPTIME
TOPICAL | Status: DC | PRN
  Administered 2017-11-10: 1000 mL

## 2017-11-10 MED ORDER — CEFAZOLIN SODIUM-DEXTROSE 2-3 GM-%(50ML) IV SOLR
INTRAVENOUS | Status: DC | PRN
  Administered 2017-11-10: 2 g via INTRAVENOUS

## 2017-11-10 MED ORDER — LORAZEPAM 2 MG/ML IJ SOLN: 1.0000 mg | INTRAMUSCULAR | Status: DC | PRN

## 2017-11-10 MED ORDER — PHENYLEPHRINE 40 MCG/ML (10ML) SYRINGE FOR IV PUSH (FOR BLOOD PRESSURE SUPPORT)
PREFILLED_SYRINGE | INTRAVENOUS | Status: AC
  Filled 2017-11-10: qty 10

## 2017-11-10 MED ORDER — OMEGA-3 FATTY ACIDS 1000 MG PO CAPS: 1.0000 g | ORAL_CAPSULE | Freq: Every day | ORAL | Status: DC

## 2017-11-10 MED ORDER — ROCURONIUM BROMIDE 10 MG/ML (PF) SYRINGE
PREFILLED_SYRINGE | INTRAVENOUS | Status: DC | PRN
  Administered 2017-11-10: 40 mg via INTRAVENOUS

## 2017-11-10 MED ORDER — PROPOFOL 10 MG/ML IV BOLUS
INTRAVENOUS | Status: AC
  Filled 2017-11-10: qty 20

## 2017-11-10 MED ORDER — DOCUSATE SODIUM 100 MG PO CAPS
100.0000 mg | ORAL_CAPSULE | Freq: Two times a day (BID) | ORAL | Status: DC
  Administered 2017-11-11: 100 mg via ORAL
  Filled 2017-11-10: qty 1

## 2017-11-10 MED ORDER — SUGAMMADEX SODIUM 200 MG/2ML IV SOLN
INTRAVENOUS | Status: DC | PRN
  Administered 2017-11-10: 121.6 mg via INTRAVENOUS

## 2017-11-10 MED ORDER — ESCITALOPRAM OXALATE 10 MG PO TABS: 10.0000 mg | ORAL_TABLET | Freq: Every evening | ORAL | Status: DC

## 2017-11-10 MED ORDER — LABETALOL HCL 5 MG/ML IV SOLN
INTRAVENOUS | Status: DC | PRN
  Administered 2017-11-10: 20 mg via INTRAVENOUS

## 2017-11-10 MED ORDER — CEFAZOLIN SODIUM-DEXTROSE 2-4 GM/100ML-% IV SOLN: 2.0000 g | INTRAVENOUS | Status: DC

## 2017-11-10 MED ORDER — ONDANSETRON HCL 4 MG/2ML IJ SOLN: 4.0000 mg | Freq: Four times a day (QID) | INTRAMUSCULAR | Status: DC | PRN

## 2017-11-10 MED ORDER — POLYETHYLENE GLYCOL 3350 17 G PO PACK: 17.0000 g | PACK | Freq: Every day | ORAL | Status: DC | PRN

## 2017-11-10 MED ORDER — SODIUM CHLORIDE 0.9 % IV SOLN: INTRAVENOUS | Status: DC

## 2017-11-10 MED ORDER — PHENYLEPHRINE HCL 10 MG/ML IJ SOLN
INTRAMUSCULAR | Status: DC | PRN
  Administered 2017-11-10: 50 ug via INTRAVENOUS

## 2017-11-10 MED ORDER — CHOLECALCIFEROL 100 MCG (4000 UT) PO CAPS: 4000.0000 [IU] | ORAL_CAPSULE | Freq: Every day | ORAL | Status: DC

## 2017-11-10 MED ORDER — DEXTROSE 5 % IV SOLN
INTRAVENOUS | Status: DC | PRN
  Administered 2017-11-10: 25 ug/min via INTRAVENOUS

## 2017-11-10 MED ORDER — FENTANYL CITRATE (PF) 250 MCG/5ML IJ SOLN
INTRAMUSCULAR | Status: DC | PRN
  Administered 2017-11-10: 50 ug via INTRAVENOUS
  Administered 2017-11-10: 100 ug via INTRAVENOUS

## 2017-11-10 MED ORDER — MIDAZOLAM HCL 5 MG/5ML IJ SOLN
INTRAMUSCULAR | Status: DC | PRN
  Administered 2017-11-10 (×2): 1 mg via INTRAVENOUS

## 2017-11-10 MED ORDER — VITAMIN C 500 MG PO TABS: 1000.0000 mg | ORAL_TABLET | Freq: Every day | ORAL | Status: DC

## 2017-11-10 MED ORDER — HYDRALAZINE HCL 20 MG/ML IJ SOLN
INTRAMUSCULAR | Status: DC | PRN
  Administered 2017-11-10: 10 mg via INTRAVENOUS

## 2017-11-10 MED ORDER — ACETAMINOPHEN 325 MG PO TABS
650.0000 mg | ORAL_TABLET | Freq: Three times a day (TID) | ORAL | Status: DC | PRN
  Filled 2017-11-10: qty 2

## 2017-11-10 MED ORDER — KETOROLAC TROMETHAMINE 15 MG/ML IJ SOLN: 15.0000 mg | Freq: Four times a day (QID) | INTRAMUSCULAR | Status: DC | PRN

## 2017-11-10 MED ORDER — ALBUTEROL SULFATE (2.5 MG/3ML) 0.083% IN NEBU
2.5000 mg | INHALATION_SOLUTION | Freq: Four times a day (QID) | RESPIRATORY_TRACT | Status: AC
  Administered 2017-11-10: 2.5 mg via RESPIRATORY_TRACT
  Filled 2017-11-10: qty 3

## 2017-11-10 MED ORDER — BUPIVACAINE HCL (PF) 0.25 % IJ SOLN
INTRAMUSCULAR | Status: AC
  Filled 2017-11-10: qty 30

## 2017-11-10 MED ORDER — MENTHOL 3 MG MT LOZG: 1.0000 | LOZENGE | OROMUCOSAL | Status: DC | PRN

## 2017-11-10 MED ORDER — DEXAMETHASONE SODIUM PHOSPHATE 10 MG/ML IJ SOLN
INTRAMUSCULAR | Status: DC | PRN
  Administered 2017-11-10: 10 mg via INTRAVENOUS

## 2017-11-10 MED ORDER — LACTATED RINGERS IV SOLN
INTRAVENOUS | Status: DC | PRN
  Administered 2017-11-10: 07:00:00 via INTRAVENOUS

## 2017-11-10 MED ORDER — LIDOCAINE 2% (20 MG/ML) 5 ML SYRINGE
INTRAMUSCULAR | Status: DC | PRN
  Administered 2017-11-10: 40 mg via INTRAVENOUS

## 2017-11-10 MED ORDER — ONDANSETRON HCL 4 MG/2ML IJ SOLN
INTRAMUSCULAR | Status: DC | PRN
  Administered 2017-11-10: 4 mg via INTRAVENOUS

## 2017-11-10 MED ORDER — DEXAMETHASONE SODIUM PHOSPHATE 10 MG/ML IJ SOLN
INTRAMUSCULAR | Status: AC
  Filled 2017-11-10: qty 1

## 2017-11-10 MED ORDER — DEXAMETHASONE SODIUM PHOSPHATE 10 MG/ML IJ SOLN: INTRAMUSCULAR | Status: DC | PRN

## 2017-11-10 MED ORDER — METHOCARBAMOL 1000 MG/10ML IJ SOLN
500.0000 mg | Freq: Four times a day (QID) | INTRAVENOUS | Status: DC | PRN
  Filled 2017-11-10: qty 5

## 2017-11-10 MED ORDER — FENTANYL CITRATE (PF) 250 MCG/5ML IJ SOLN
INTRAMUSCULAR | Status: AC
  Filled 2017-11-10: qty 5

## 2017-11-10 MED ORDER — ENOXAPARIN SODIUM 40 MG/0.4ML ~~LOC~~ SOLN
40.0000 mg | SUBCUTANEOUS | Status: DC
  Administered 2017-11-11: 40 mg via SUBCUTANEOUS
  Filled 2017-11-10: qty 0.4

## 2017-11-10 MED ORDER — FENTANYL CITRATE (PF) 100 MCG/2ML IJ SOLN
INTRAMUSCULAR | Status: DC | PRN
  Administered 2017-11-10: 25 ug via INTRAVENOUS

## 2017-11-10 MED ORDER — BUPIVACAINE LIPOSOME 1.3 % IJ SUSP
INTRAMUSCULAR | Status: DC | PRN
  Administered 2017-11-10: 10 mL via PERINEURAL

## 2017-11-10 MED ORDER — HYDRALAZINE HCL 20 MG/ML IJ SOLN: 5.0000 mg | INTRAMUSCULAR | Status: DC | PRN

## 2017-11-10 MED ORDER — ALUM & MAG HYDROXIDE-SIMETH 200-200-20 MG/5ML PO SUSP: 30.0000 mL | ORAL | Status: DC | PRN

## 2017-11-10 MED ORDER — LACTATED RINGERS IV SOLN
INTRAVENOUS | Status: DC
Start: 2017-11-10 — End: 2017-11-11
  Administered 2017-11-10: 16:00:00 via INTRAVENOUS

## 2017-11-10 MED ORDER — FUROSEMIDE 10 MG/ML IJ SOLN
40.0000 mg | Freq: Once | INTRAMUSCULAR | Status: AC
  Administered 2017-11-11: 40 mg via INTRAVENOUS
  Filled 2017-11-10: qty 4

## 2017-11-10 MED ORDER — ONDANSETRON HCL 4 MG/2ML IJ SOLN
INTRAMUSCULAR | Status: AC
  Filled 2017-11-10: qty 6

## 2017-11-10 MED ORDER — DIPHENHYDRAMINE HCL 12.5 MG/5ML PO ELIX: 12.5000 mg | ORAL_SOLUTION | ORAL | Status: DC | PRN

## 2017-11-10 MED ORDER — VITAMIN B-6 100 MG PO TABS: 100.0000 mg | ORAL_TABLET | Freq: Every day | ORAL | Status: DC

## 2017-11-10 MED ORDER — HYDROCODONE-ACETAMINOPHEN 5-325 MG PO TABS: 1.0000 | ORAL_TABLET | ORAL | Status: DC | PRN

## 2017-11-10 MED ORDER — LIDOCAINE 2% (20 MG/ML) 5 ML SYRINGE
INTRAMUSCULAR | Status: AC
  Filled 2017-11-10: qty 5

## 2017-11-10 MED ORDER — SENNOSIDES-DOCUSATE SODIUM 8.6-50 MG PO TABS: 1.0000 | ORAL_TABLET | Freq: Every evening | ORAL | Status: DC | PRN

## 2017-11-10 MED ORDER — PROPOFOL 10 MG/ML IV BOLUS
INTRAVENOUS | Status: DC | PRN
  Administered 2017-11-10: 100 mg via INTRAVENOUS

## 2017-11-10 MED ORDER — ALBUTEROL SULFATE HFA 108 (90 BASE) MCG/ACT IN AERS
INHALATION_SPRAY | RESPIRATORY_TRACT | Status: AC
  Filled 2017-11-10: qty 6.7

## 2017-11-10 MED ORDER — ONDANSETRON HCL 4 MG PO TABS: 4.0000 mg | ORAL_TABLET | Freq: Four times a day (QID) | ORAL | Status: DC | PRN

## 2017-11-10 MED ORDER — CALCIUM-VITAMIN D 600-400 MG-UNIT PO TABS: 1.0000 | ORAL_TABLET | Freq: Every day | ORAL | Status: DC

## 2017-11-10 MED ORDER — CHLORHEXIDINE GLUCONATE 4 % EX LIQD: 60.0000 mL | Freq: Once | CUTANEOUS | Status: DC

## 2017-11-10 MED ORDER — BUPIVACAINE HCL (PF) 0.5 % IJ SOLN
INTRAMUSCULAR | Status: DC | PRN
  Administered 2017-11-10: 75 mL via PERINEURAL

## 2017-11-10 MED ORDER — KETOROLAC TROMETHAMINE 15 MG/ML IJ SOLN
7.5000 mg | Freq: Four times a day (QID) | INTRAMUSCULAR | Status: DC
  Filled 2017-11-10: qty 1

## 2017-11-10 MED ORDER — ZOLPIDEM TARTRATE 5 MG PO TABS: 5.0000 mg | ORAL_TABLET | Freq: Every evening | ORAL | Status: DC | PRN

## 2017-11-10 MED ORDER — ACETAMINOPHEN 325 MG PO TABS
325.0000 mg | ORAL_TABLET | Freq: Four times a day (QID) | ORAL | Status: DC | PRN
Start: 2017-11-11 — End: 2017-11-11

## 2017-11-10 MED ORDER — METOCLOPRAMIDE HCL 5 MG PO TABS: 5.0000 mg | ORAL_TABLET | Freq: Three times a day (TID) | ORAL | Status: DC | PRN

## 2017-11-10 MED ORDER — PHENOL 1.4 % MT LIQD: 1.0000 | OROMUCOSAL | Status: DC | PRN

## 2017-11-10 MED ORDER — TRAMADOL HCL 50 MG PO TABS
50.0000 mg | ORAL_TABLET | Freq: Four times a day (QID) | ORAL | Status: DC
  Administered 2017-11-11: 50 mg via ORAL
  Filled 2017-11-10: qty 1

## 2017-11-10 MED ORDER — METOCLOPRAMIDE HCL 5 MG/ML IJ SOLN: 5.0000 mg | Freq: Three times a day (TID) | INTRAMUSCULAR | Status: DC | PRN

## 2017-11-10 MED ORDER — PANTOPRAZOLE SODIUM 40 MG PO TBEC
40.0000 mg | DELAYED_RELEASE_TABLET | Freq: Every day | ORAL | Status: DC
  Administered 2017-11-11: 40 mg via ORAL
  Filled 2017-11-10: qty 1

## 2017-11-10 MED ORDER — BISACODYL 10 MG RE SUPP
10.0000 mg | Freq: Every day | RECTAL | Status: DC | PRN
Start: 2017-11-10 — End: 2017-11-11

## 2017-11-10 MED ORDER — ESMOLOL HCL 100 MG/10ML IV SOLN
INTRAVENOUS | Status: DC | PRN
  Administered 2017-11-10 (×2): 10 mg via INTRAVENOUS

## 2017-11-10 MED ORDER — MORPHINE SULFATE (PF) 2 MG/ML IV SOLN: 0.5000 mg | INTRAVENOUS | Status: DC | PRN

## 2017-11-10 MED ORDER — ACETAMINOPHEN 500 MG PO TABS
500.0000 mg | ORAL_TABLET | Freq: Four times a day (QID) | ORAL | Status: DC
  Administered 2017-11-10 – 2017-11-11 (×2): 500 mg via ORAL
  Filled 2017-11-10 (×2): qty 1

## 2017-11-10 SURGICAL SUPPLY — 48 items
BIT DRILL 5/64X5 DISP (BIT) IMPLANT
BIT DRILL 7/64X5 DISP (BIT) IMPLANT
BLADE SAW SGTL MED 73X18.5 STR (BLADE) ×3 IMPLANT
CLSR STERI-STRIP ANTIMIC 1/2X4 (GAUZE/BANDAGES/DRESSINGS) ×3 IMPLANT
COVER SURGICAL LIGHT HANDLE (MISCELLANEOUS) ×3 IMPLANT
DRAPE HALF SHEET 40X57 (DRAPES) ×3 IMPLANT
DRAPE ORTHO SPLIT 77X108 STRL (DRAPES) ×4
DRAPE SURG ORHT 6 SPLT 77X108 (DRAPES) ×4 IMPLANT
DRAPE U-SHAPE 47X51 STRL (DRAPES) ×3 IMPLANT
DRSG MEPILEX BORDER 4X8 (GAUZE/BANDAGES/DRESSINGS) ×3 IMPLANT
DURAPREP 26ML APPLICATOR (WOUND CARE) ×1 IMPLANT
ELECT REM PT RETURN 9FT ADLT (ELECTROSURGICAL)
ELECTRODE REM PT RTRN 9FT ADLT (ELECTROSURGICAL) ×1 IMPLANT
GLOVE BIOGEL PI ORTHO PRO SZ8 (GLOVE) ×2
GLOVE ORTHO TXT STRL SZ7.5 (GLOVE) ×3 IMPLANT
GLOVE PI ORTHO PRO STRL SZ8 (GLOVE) ×4 IMPLANT
GLOVE SURG ORTHO 8.0 STRL STRW (GLOVE) ×3 IMPLANT
GOWN STRL REUS W/ TWL XL LVL3 (GOWN DISPOSABLE) ×2 IMPLANT
GOWN STRL REUS W/TWL 2XL LVL3 (GOWN DISPOSABLE) ×3 IMPLANT
GOWN STRL REUS W/TWL XL LVL3 (GOWN DISPOSABLE) ×2
HANDPIECE INTERPULSE COAX TIP (DISPOSABLE) ×2
HOOD PEEL AWAY FACE SHEILD DIS (HOOD) ×3 IMPLANT
HOOD PEEL AWAY FLYTE STAYCOOL (MISCELLANEOUS) ×3 IMPLANT
KIT BASIN OR (CUSTOM PROCEDURE TRAY) ×3 IMPLANT
KIT TURNOVER KIT B (KITS) ×1 IMPLANT
MANIFOLD NEPTUNE II (INSTRUMENTS) ×1 IMPLANT
NS IRRIG 1000ML POUR BTL (IV SOLUTION) ×3 IMPLANT
PACK SHOULDER (CUSTOM PROCEDURE TRAY) ×3 IMPLANT
PAD ARMBOARD 7.5X6 YLW CONV (MISCELLANEOUS) ×6 IMPLANT
SET HNDPC FAN SPRY TIP SCT (DISPOSABLE) ×2 IMPLANT
SLING ARM IMMOBILIZER LRG (SOFTGOODS) IMPLANT
SLING ARM IMMOBILIZER MED (SOFTGOODS) IMPLANT
SMARTMIX MINI TOWER (MISCELLANEOUS) ×2
SPONGE LAP 18X18 X RAY DECT (DISPOSABLE) ×6 IMPLANT
SUCTION FRAZIER HANDLE 10FR (MISCELLANEOUS) ×1
SUCTION TUBE FRAZIER 10FR DISP (MISCELLANEOUS) ×2 IMPLANT
SUPPORT WRAP ARM LG (MISCELLANEOUS) ×3 IMPLANT
SUT FIBERWIRE #2 38 REV NDL BL (SUTURE) ×10
SUT VIC AB 0 CT1 27 (SUTURE) ×2
SUT VIC AB 0 CT1 27XBRD ANBCTR (SUTURE) ×2 IMPLANT
SUT VIC AB 2-0 CT1 27 (SUTURE) ×2
SUT VIC AB 2-0 CT1 TAPERPNT 27 (SUTURE) ×2 IMPLANT
SUT VIC AB 3-0 SH 8-18 (SUTURE) ×3 IMPLANT
SUTURE FIBERWR#2 38 REV NDL BL (SUTURE) ×10 IMPLANT
TOWEL OR 17X26 10 PK STRL BLUE (TOWEL DISPOSABLE) ×3 IMPLANT
TOWER SMARTMIX MINI (MISCELLANEOUS) ×2 IMPLANT
TUBE CONNECTING 12X1/4 (SUCTIONS) IMPLANT
YANKAUER SUCT BULB TIP NO VENT (SUCTIONS) IMPLANT

## 2017-11-10 SURGICAL SUPPLY — 56 items
ADPR HD STD TPR HUM TI RVRS (Orthopedic Implant) ×1 IMPLANT
BIT DRILL 5/64X5 DISP (BIT) IMPLANT
BIT DRILL 7/64X5 DISP (BIT) IMPLANT
BLADE SAW SGTL MED 73X18.5 STR (BLADE) ×2 IMPLANT
CEMENT BONE R 1X40 (Cement) ×2 IMPLANT
CLSR STERI-STRIP ANTIMIC 1/2X4 (GAUZE/BANDAGES/DRESSINGS) ×2 IMPLANT
COVER SURGICAL LIGHT HANDLE (MISCELLANEOUS) ×2 IMPLANT
DRAPE HALF SHEET 40X57 (DRAPES) ×2 IMPLANT
DRAPE ORTHO SPLIT 77X108 STRL (DRAPES) ×4
DRAPE SURG ORHT 6 SPLT 77X108 (DRAPES) ×2 IMPLANT
DRAPE U-SHAPE 47X51 STRL (DRAPES) ×2 IMPLANT
DRSG MEPILEX BORDER 4X8 (GAUZE/BANDAGES/DRESSINGS) ×2 IMPLANT
DURAPREP 26ML APPLICATOR (WOUND CARE) ×2 IMPLANT
ELECT REM PT RETURN 9FT ADLT (ELECTROSURGICAL) ×2
ELECTRODE REM PT RTRN 9FT ADLT (ELECTROSURGICAL) ×1 IMPLANT
GLENOID PEGGED HYBRID SM 4MM (Orthopedic Implant) ×1 IMPLANT
GLENOID POST REGENREX HYBRID (Orthopedic Implant) ×1 IMPLANT
GLOVE BIOGEL PI ORTHO PRO SZ8 (GLOVE) ×2
GLOVE ORTHO TXT STRL SZ7.5 (GLOVE) ×3 IMPLANT
GLOVE PI ORTHO PRO STRL SZ8 (GLOVE) ×2 IMPLANT
GLOVE SURG ORTHO 8.0 STRL STRW (GLOVE) ×3 IMPLANT
GLOVE SURG SS PI 7.0 STRL IVOR (GLOVE) ×1 IMPLANT
GOWN STRL REUS W/ TWL XL LVL3 (GOWN DISPOSABLE) ×1 IMPLANT
GOWN STRL REUS W/TWL 2XL LVL3 (GOWN DISPOSABLE) ×2 IMPLANT
GOWN STRL REUS W/TWL XL LVL3 (GOWN DISPOSABLE) ×2
HANDPIECE INTERPULSE COAX TIP (DISPOSABLE) ×2
HEAD HUMERAL BIPOLAR 42X18X46 (Orthopedic Implant) IMPLANT
HEAD HUMERAL COMP STD (Orthopedic Implant) IMPLANT
HOOD PEEL AWAY FACE SHEILD DIS (HOOD) ×2 IMPLANT
HOOD PEEL AWAY FLYTE STAYCOOL (MISCELLANEOUS) ×2 IMPLANT
HUMERAL HEAD BIPOLAR 42X18X46 (Orthopedic Implant) ×2 IMPLANT
HUMERAL HEAD COMP STD (Orthopedic Implant) ×2 IMPLANT
KIT BASIN OR (CUSTOM PROCEDURE TRAY) ×2 IMPLANT
KIT TURNOVER KIT B (KITS) ×2 IMPLANT
MANIFOLD NEPTUNE II (INSTRUMENTS) ×2 IMPLANT
NS IRRIG 1000ML POUR BTL (IV SOLUTION) ×2 IMPLANT
PACK SHOULDER (CUSTOM PROCEDURE TRAY) ×2 IMPLANT
PAD ARMBOARD 7.5X6 YLW CONV (MISCELLANEOUS) ×4 IMPLANT
PIN HUMERAL STMN 3.2MMX9IN (INSTRUMENTS) ×1 IMPLANT
SET HNDPC FAN SPRY TIP SCT (DISPOSABLE) ×1 IMPLANT
SLING ARM IMMOBILIZER MED (SOFTGOODS) ×1 IMPLANT
SMARTMIX MINI TOWER (MISCELLANEOUS) ×2
SPONGE LAP 18X18 X RAY DECT (DISPOSABLE) ×4 IMPLANT
STEM HUMERAL STRL 11MMX55MM (Stem) ×1 IMPLANT
SUCTION FRAZIER HANDLE 10FR (MISCELLANEOUS) ×1
SUCTION TUBE FRAZIER 10FR DISP (MISCELLANEOUS) ×1 IMPLANT
SUPPORT WRAP ARM LG (MISCELLANEOUS) ×2 IMPLANT
SUT FIBERWIRE #2 38 REV NDL BL (SUTURE) ×12
SUT VIC AB 0 CT1 27 (SUTURE) ×4
SUT VIC AB 0 CT1 27XBRD ANBCTR (SUTURE) ×1 IMPLANT
SUT VIC AB 2-0 CT1 27 (SUTURE) ×2
SUT VIC AB 2-0 CT1 TAPERPNT 27 (SUTURE) ×1 IMPLANT
SUT VIC AB 3-0 SH 8-18 (SUTURE) ×2 IMPLANT
SUTURE FIBERWR#2 38 REV NDL BL (SUTURE) ×5 IMPLANT
TOWEL OR 17X26 10 PK STRL BLUE (TOWEL DISPOSABLE) ×2 IMPLANT
TOWER SMARTMIX MINI (MISCELLANEOUS) ×1 IMPLANT

## 2017-11-10 NOTE — Anesthesia Preprocedure Evaluation (Signed)
Anesthesia Evaluation  Patient identified by MRN, date of birth, ID band Patient awake    Reviewed: Allergy & Precautions, NPO status , Patient's Chart, lab work & pertinent test results, reviewed documented beta blocker date and time   History of Anesthesia Complications Negative for: history of anesthetic complications  Airway Mallampati: II  TM Distance: <3 FB Neck ROM: Full    Dental  (+) Teeth Intact   Pulmonary neg shortness of breath, neg sleep apnea, neg COPD, neg recent URI, former smoker,    breath sounds clear to auscultation       Cardiovascular hypertension, Pt. on medications and Pt. on home beta blockers  Rhythm:Regular     Neuro/Psych PSYCHIATRIC DISORDERS Anxiety Depression negative neurological ROS     GI/Hepatic   Endo/Other  negative endocrine ROS  Renal/GU      Musculoskeletal  (+) Arthritis ,   Abdominal   Peds  Hematology negative hematology ROS (+)   Anesthesia Other Findings   Reproductive/Obstetrics                             Anesthesia Physical Anesthesia Plan  ASA: II  Anesthesia Plan: General and Regional   Post-op Pain Management:  Regional for Post-op pain   Induction: Intravenous  PONV Risk Score and Plan: 3 and Ondansetron and Dexamethasone  Airway Management Planned: Oral ETT  Additional Equipment: None  Intra-op Plan:   Post-operative Plan: Extubation in OR  Informed Consent: I have reviewed the patients History and Physical, chart, labs and discussed the procedure including the risks, benefits and alternatives for the proposed anesthesia with the patient or authorized representative who has indicated his/her understanding and acceptance.   Dental advisory given  Plan Discussed with: CRNA and Surgeon  Anesthesia Plan Comments:         Anesthesia Quick Evaluation

## 2017-11-10 NOTE — Anesthesia Preprocedure Evaluation (Signed)
Anesthesia Evaluation  Patient identified by MRN, date of birth, ID band Patient awake    Reviewed: Allergy & Precautions, NPO status , Patient's Chart, lab work & pertinent test results, reviewed documented beta blocker date and time   History of Anesthesia Complications Negative for: history of anesthetic complications  Airway Mallampati: II  TM Distance: <3 FB Neck ROM: Full    Dental  (+) Teeth Intact   Pulmonary neg shortness of breath, neg sleep apnea, neg COPD, neg recent URI, former smoker,    breath sounds clear to auscultation       Cardiovascular hypertension, Pt. on medications and Pt. on home beta blockers  Rhythm:Regular     Neuro/Psych PSYCHIATRIC DISORDERS Anxiety Depression negative neurological ROS     GI/Hepatic GERD  Controlled and Medicated,  Endo/Other  negative endocrine ROS  Renal/GU      Musculoskeletal  (+) Arthritis ,   Abdominal   Peds  Hematology negative hematology ROS (+)   Anesthesia Other Findings   Reproductive/Obstetrics                             Anesthesia Physical Anesthesia Plan  ASA: II  Anesthesia Plan: General and Regional   Post-op Pain Management:  Regional for Post-op pain   Induction:   PONV Risk Score and Plan: 3 and Ondansetron and Dexamethasone  Airway Management Planned: Oral ETT  Additional Equipment: None  Intra-op Plan:   Post-operative Plan: Extubation in OR  Informed Consent: I have reviewed the patients History and Physical, chart, labs and discussed the procedure including the risks, benefits and alternatives for the proposed anesthesia with the patient or authorized representative who has indicated his/her understanding and acceptance.   Dental advisory given  Plan Discussed with: CRNA and Surgeon  Anesthesia Plan Comments: (Patient block prior to 0730 procedure and suffered acute respiratory distress with right  elevated hemidiaphragm from ISB with phrenic involvement. Symptoms improved with BP control and BiPAP and resolved prior to procedure this afternoon. )        Anesthesia Quick Evaluation

## 2017-11-10 NOTE — H&P (Addendum)
History and Physical    Jasmine Swanson BCW:[id number] DOB: [birth year] DOA: 11/10/2017  PCP: Cari Caraway, MD Patient coming from: home PACU  Chief Complaint: acute respiratory failure with hypoxia / uncontrolled BP  HPI: Jasmine Swanson is a 77 y.o. female with medical history significant for hypertension, hyperlipidemia, GERD, PAT, PA-C, left bundle branch block, anxiety, osteoarthritis who developed acute respiratory failure with hypoxia status post right shoulder block in anticipation of shoulder surgery as well as uncontrolled blood pressure. [facility] are asked to admit  Information is obtained from the chart and the patient noting that information from patient may be unreliable as she's had 2 mg of Versed within the last hour. Patient scheduled first shoulder surgery today. She was provided with the right shoulder block. Shortly thereafter she developed acute respiratory failure with hypoxia and uncontrolled blood pressure. Patient was provided with labetalol, hydralazine and BiPAP. She denies any chest pain or sensation of shortness of breath. She denies any pain at all at the point of admission. She is inquiring about when she will have her surgery.  At the time of admission blood pressure is controlled she remains on BiPAP oxygen saturation level greater than 90%. Chest x-ray was obtained revealing hazy right upper lobe infiltrate with additional mild atelectasis versus infiltrate at lung bases bilaterally    ED Course: not applicable  Review of Systems: As per HPI otherwise all other systems reviewed and are negative.   Ambulatory Status: lives at home alone and ambulates independently is independent with ADLs  Past Medical History:  Diagnosis Date  . Anxiety   . Arthritis   . Bundle branch block, left   . Complication of anesthesia    laryngeal spams 50 yrs ago  . Depression   . Dysphagia   . Essential hypertension 06/17/2013  . GERD (gastroesophageal reflux  disease)   . Hyperlipidemia   . Hypertension   . Kyphosis   . PAC (premature atrial contraction) 06/17/2013  . PAT (paroxysmal atrial tachycardia) ([place]) 06/17/2013  . Plantar fasciitis of right foot   . Preop cardiovascular exam 10/16/2017  . Right shoulder pain 01/20/2012    Past Surgical History:  Procedure Laterality Date  . ABDOMINAL HYSTERECTOMY    . bladder tac    . CARDIAC CATHETERIZATION    . TONSILLECTOMY      Social History   Socioeconomic History  . Marital status: Divorced    Spouse name: Not on file  . Number of children: Not on file  . Years of education: Not on file  . Highest education level: Not on file  Occupational History  . Not on file  Social Needs  . Financial resource strain: Not on file  . Food insecurity:    Worry: Not on file    Inability: Not on file  . Transportation needs:    Medical: Not on file    Non-medical: Not on file  Tobacco Use  . Smoking status: Former Research scientist (life sciences)  . Smokeless tobacco: Never Used  Substance and Sexual Activity  . Alcohol use: Yes    Comment: occ  . Drug use: No  . Sexual activity: Not on file  Lifestyle  . Physical activity:    Days per week: Not on file    Minutes per session: Not on file  . Stress: Not on file  Relationships  . Social connections:    Talks on phone: Not on file    Gets together: Not on file    Attends religious  service: Not on file    Active member of club or organization: Not on file    Attends meetings of clubs or organizations: Not on file    Relationship status: Not on file  . Intimate partner violence:    Fear of current or ex partner: Not on file    Emotionally abused: Not on file    Physically abused: Not on file    Forced sexual activity: Not on file  Other Topics Concern  . Not on file  Social History Narrative  . Not on file    Allergies  Allergen Reactions  . Benicar Hct [Olmesartan Medoxomil-Hctz]     Edema of lower extremities   . Codeine Other (See Comments)     Drops blood pressure  . Diovan [Valsartan] Other (See Comments)    Palpitations.    . Niacin And Related Other (See Comments)    Turns patient red, makes her pass out  . Solu-Medrol [Methylprednisolone]     Increased HR and shaking all over     Family History  Problem Relation Age of Onset  . Hyperlipidemia Mother   . Hypertension Mother   . Heart attack Mother   . Cancer - Colon Father   . Sudden death Neg Hx   . Diabetes Neg Hx     Prior to Admission medications   Medication Sig Start Date End Date Taking? Authorizing Provider  acetaminophen (TYLENOL) 650 MG CR tablet Take 650 mg by mouth every 8 (eight) hours as needed for pain.   Yes [provider]  Ascorbic Acid (VITAMIN C) 1000 MG tablet Take 1,000 mg by mouth daily.   Yes [provider]  aspirin EC 81 MG tablet Take 81 mg by mouth every Monday, Wednesday, and Friday.   Yes [provider]  Calcium Carb-Cholecalciferol (CALCIUM-VITAMIN D) 600-400 MG-UNIT TABS Take 1 tablet by mouth daily.   Yes [provider]  calcium carbonate (TUMS - DOSED IN MG ELEMENTAL CALCIUM) 500 MG chewable tablet Chew 1 tablet by mouth daily as needed for indigestion.    Yes [provider]  Cholecalciferol 4000 units CAPS Take 4,000 Units by mouth daily.   Yes [provider]  escitalopram (LEXAPRO) 10 MG tablet Take 10 mg by mouth every evening.  04/08/13  Yes [provider]  fish oil-omega-3 fatty acids 1000 MG capsule Take 1 g by mouth daily.    Yes [provider]  Flaxseed, Linseed, (FLAXSEED OIL) 1000 MG CAPS Take 1,000 mg by mouth daily.   Yes [provider]  hydrocortisone cream 1 % Apply 1 application topically daily as needed for itching (bug bites).   Yes [provider]  LORazepam (ATIVAN) 0.5 MG tablet Take 0.25 mg by mouth 2 (two) times daily as needed for anxiety.   Yes [provider]  Magnesium 250 MG TABS Take 250 mg by mouth daily.     Yes [provider]  metoprolol (TOPROL-XL) 200 MG 24 hr tablet Take 200 mg by mouth every evening.    Yes [provider]  omeprazole (PRILOSEC) 20 MG capsule Take 20 mg by mouth every other day.   Yes [provider]  pyridOXINE (VITAMIN B-6) 100 MG tablet Take 100 mg by mouth daily.   Yes [provider]  rosuvastatin (CRESTOR) 20 MG tablet Take 20 mg by mouth every evening.    Yes [provider]  diphenhydrAMINE (BENADRYL) 25 MG tablet Take 25 mg by mouth daily as needed  for itching (poison oak).    [provider]    Physical Exam: Vitals:   11/10/17 0804 11/10/17 0815 11/10/17 0816 11/10/17 0819  BP: (!) 142/72  (!) 142/72 (!) 129/57  Pulse: 77 77 78 78  Resp: (!) 23 19 17 17   Temp: (!) 97 F (36.1 C)     TempSrc:      SpO2: 92% 95% 97% 94%  Weight:      Height:         General:  Lying in bed on BiPAP arouses to verbal stimuli in no acute distress Eyes:  PERRL, EOMI, normal lids, iris ENT:  grossly normal hearing, lips & tongue, ucous membranes of her mouth are pink slightly dry Neck:  no LAD, masses or thyromegaly Cardiovascular:  RRR, no m/r/g. No LE edema. Pedal pulses present and palpable Respiratory:  BiPAP is intact. Respirations shallow breath sounds are distant but clear Abdomen:  soft, ntnd, positive bowel sounds throughout no guarding or rebounding Skin:  no rash or induration seen on limited exam Musculoskeletal:  grossly normal tone BUE/BLE, good ROM, no bony abnormality Psychiatric:  grossly normal mood and affect, speech fluent and appropriate, AOx3 Neurologic:  Lethargic but arouses to verbal stimuli responds to questions and commands appropriately to all extremities spontaneously  Labs on Admission: I have personally reviewed following labs and imaging studies  CBC: No results for input(s): WBC, NEUTROABS, HGB, HCT, MCV, PLT in the last 168 hours. Basic Metabolic Panel: No results for input(s): NA,  K, CL, CO2, GLUCOSE, BUN, CREATININE, CALCIUM, MG, PHOS in the last 168 hours. GFR: Estimated Creatinine Clearance: 46.9 mL/min (by C-G formula based on SCr of 0.74 mg/dL). Liver Function Tests: No results for input(s): AST, ALT, ALKPHOS, BILITOT, PROT, ALBUMIN in the last 168 hours. No results for input(s): LIPASE, AMYLASE in the last 168 hours. No results for input(s): AMMONIA in the last 168 hours. Coagulation Profile: No results for input(s): INR, PROTIME in the last 168 hours. Cardiac Enzymes: No results for input(s): CKTOTAL, CKMB, CKMBINDEX, TROPONINI in the last 168 hours. BNP (last 3 results) No results for input(s): PROBNP in the last 8760 hours. HbA1C: No results for input(s): HGBA1C in the last 72 hours. CBG: No results for input(s): GLUCAP in the last 168 hours. Lipid Profile: No results for input(s): CHOL, HDL, LDLCALC, TRIG, CHOLHDL, LDLDIRECT in the last 72 hours. Thyroid Function Tests: No results for input(s): TSH, T4TOTAL, FREET4, T3FREE, THYROIDAB in the last 72 hours. Anemia Panel: No results for input(s): VITAMINB12, FOLATE, FERRITIN, TIBC, IRON, RETICCTPCT in the last 72 hours. Urine analysis: No results found for: COLORURINE, APPEARANCEUR, LABSPEC, PHURINE, GLUCOSEU, HGBUR, BILIRUBINUR, KETONESUR, PROTEINUR, UROBILINOGEN, NITRITE, LEUKOCYTESUR  Creatinine Clearance: Estimated Creatinine Clearance: 46.9 mL/min (by C-G formula based on SCr of 0.74 mg/dL).  Sepsis Labs: @LABRCNTIP (procalcitonin:4,lacticidven:4) )No results found for this or any previous visit (from the past 240 hour(s)).   Radiological Exams on Admission: Dg Chest Port 1 View  Result Date: 11/10/2017 CLINICAL DATA:  Preoperative evaluation for shoulder surgery, hypoxia, increased blood pressure, wheezing, history essential hypertension, GERD EXAM: PORTABLE CHEST 1 VIEW COMPARISON:  Portable exam 0812 hours compared to 10/05/2006 FINDINGS: Upper normal heart size. Atherosclerotic calcification  aorta. Mediastinal contour stable. Patient slightly rotated. Hazy RIGHT upper lobe infiltrate with additional mild infiltrate versus atelectasis at lung bases. No pleural effusion or pneumothorax. Bones demineralized with bulky spurs versus calcified loose bodies in inferior to the RIGHT glenohumeral joint. IMPRESSION: Hazy RIGHT upper lobe infiltrate with additional  mild atelectasis versus infiltrate at lung bases bilaterally. Electronically Signed   By: Lavonia Dana M.D.   On: 11/10/2017 08:29    EKG: Independently reviewed. pending  Assessment/Plan Principal Problem:   Acute respiratory failure (HCC) Active Problems:   Essential hypertension   Right shoulder pain   Hyperlipidemia   1. Acute respiratory failure with hypoxia. Etiology unclear. Maybe combination of Versed and ? Infiltrated and/or flash pulmonary edema secondary to uncontrolled BP related to extreme anxiety. Chest x-ray reveals hazy right upper lobe infiltrate with additional mild atelectasis versus infiltrate at lung bases bilaterally. Of note s/p right shoulder block. No leukocytosis afebrile no cough.  Oxygen saturation level 80% on room air in PACU. biPAP applied. -admit to stepdown -obtain ekg, troponin for completeness -improved BP control -lasix 20 mg IV once -monitor urine output -wean bipap as able -nebs -hold sedating medications -monitor  #2. Hypertension. Home medications include metoprolol. She received labetalol and hydralazine in the postanesthesia care unit. At the time of admission blood pressure controlled -Resume home meds -anxiety control -pain control -Monitor  #3. Hyperlipidemia -Continue home statin  #4. Right shoulder pain. Chart review indicates right shoulder glenohumeral osteoarthritis scheduled for right total shoulder arthroplasty today. No seizure canceled secondary to above -Per Dr. Mardelle Matte hoping to reschedule for this afternoon -npo -control anxiety and BP    DVT prophylaxis: scd    Code Status: full  Family Communication: none present  Disposition Plan: home when ready  Consults called: [facility]  Admission status: inpatient    Radene Gunning MD [facility]  If 7PM-7AM, please contact night-coverage [email or web address] Password [email or web address]  11/10/2017, 9:22 AM

## 2017-11-10 NOTE — Transfer of Care (Signed)
Immediate Anesthesia Transfer of Care Note  Patient: Jasmine Swanson  Procedure(s) Performed: TOTAL SHOULDER ARTHROPLASTY (Right Shoulder)  Patient Location: PACU  Anesthesia Type:General  Level of Consciousness: awake, alert  and patient cooperative  Airway & Oxygen Therapy: Patient Spontanous Breathing and Patient connected to face mask oxygen  Post-op Assessment: Report given to RN and Post -op Vital signs reviewed and stable  Post vital signs: Reviewed and stable  Last Vitals:  Vitals Value Taken Time  BP 143/55 11/10/2017  8:34 PM  Temp    Pulse 81 11/10/2017  8:37 PM  Resp 15 11/10/2017  8:37 PM  SpO2 95 % 11/10/2017  8:37 PM  Vitals shown include unvalidated device data.  Last Pain:  Vitals:   11/10/17 2018  TempSrc:   PainSc: 0-No pain       pt extubated and taken to the PACU with O2 on, in PACU patient discontinued oxygen and reapplied o2 sats on RA 90% increased with oxygen, alert lifts legs to request and opens mouth but does not converse and somewhat does not follow some commands MD Tobias Alexander made aware and to come evaluate patient before left the PACU, report given to PACU RN and all questions answered   Complications: No apparent anesthesia complications

## 2017-11-10 NOTE — Progress Notes (Signed)
I have been involved in the events of the day, and chart reviewed.    Patient now improved.  Plan to go back to OR for right shoulder arthroplasty.  Johnny Bridge, MD

## 2017-11-10 NOTE — Anesthesia Postprocedure Evaluation (Signed)
Anesthesia Post Note  Patient: Jasmine Swanson  Procedure(s) Performed: TOTAL SHOULDER ARTHROPLASTY (Right Shoulder)     Patient location during evaluation: PACU Anesthesia Type: General Level of consciousness: sedated Pain management: pain level controlled Vital Signs Assessment: post-procedure vital signs reviewed and stable Respiratory status: spontaneous breathing and respiratory function stable Cardiovascular status: stable Postop Assessment: no apparent nausea or vomiting Anesthetic complications: no    Last Vitals:  Vitals:   11/10/17 2157 11/10/17 2216  BP: (!) 133/58 (!) 139/56  Pulse: 82 81  Resp: 16   Temp:  37 C  SpO2: 96% 96%    Last Pain:  Vitals:   11/10/17 2216  TempSrc: Oral  PainSc: 0-No pain                 Averie Hornbaker DANIEL

## 2017-11-10 NOTE — H&P (Signed)
PREOPERATIVE H&P  Chief Complaint: right shoulder pain  HPI: Jasmine Swanson is a 77 y.o. female who presents for preoperative history and physical with a diagnosis of right shoulder glenohumeral osteoarthritis. Symptoms are rated as moderate to severe, and have been worsening.  This is significantly impairing activities of daily living.  She has elected for surgical management.   She has failed injections, activity modification, anti-inflammatories, and assistive devices.  Preoperative X-rays demonstrate end stage degenerative changes with osteophyte formation, loss of joint space, subchondral sclerosis.   Past Medical History:  Diagnosis Date  . Anxiety   . Arthritis   . Bundle branch block, left   . Complication of anesthesia    laryngeal spams 50 yrs ago  . Depression   . Dysphagia   . Essential hypertension 06/17/2013  . GERD (gastroesophageal reflux disease)   . Hyperlipidemia   . Hypertension   . Kyphosis   . PAC (premature atrial contraction) 06/17/2013  . PAT (paroxysmal atrial tachycardia) ([place]) 06/17/2013  . Plantar fasciitis of right foot   . Preop cardiovascular exam 10/16/2017  . Right shoulder pain 01/20/2012   Past Surgical History:  Procedure Laterality Date  . ABDOMINAL HYSTERECTOMY    . bladder tac    . CARDIAC CATHETERIZATION    . TONSILLECTOMY     Social History   Socioeconomic History  . Marital status: Divorced    Spouse name: Not on file  . Number of children: Not on file  . Years of education: Not on file  . Highest education level: Not on file  Occupational History  . Not on file  Social Needs  . Financial resource strain: Not on file  . Food insecurity:    Worry: Not on file    Inability: Not on file  . Transportation needs:    Medical: Not on file    Non-medical: Not on file  Tobacco Use  . Smoking status: Former Research scientist (life sciences)  . Smokeless tobacco: Never Used  Substance and Sexual Activity  . Alcohol use: Yes    Comment: occ  . Drug  use: No  . Sexual activity: Not on file  Lifestyle  . Physical activity:    Days per week: Not on file    Minutes per session: Not on file  . Stress: Not on file  Relationships  . Social connections:    Talks on phone: Not on file    Gets together: Not on file    Attends religious service: Not on file    Active member of club or organization: Not on file    Attends meetings of clubs or organizations: Not on file    Relationship status: Not on file  Other Topics Concern  . Not on file  Social History Narrative  . Not on file   Family History  Problem Relation Age of Onset  . Hyperlipidemia Mother   . Hypertension Mother   . Heart attack Mother   . Cancer - Colon Father   . Sudden death Neg Hx   . Diabetes Neg Hx    Allergies  Allergen Reactions  . Benicar Hct [Olmesartan Medoxomil-Hctz]     Edema of lower extremities   . Codeine Other (See Comments)    Drops blood pressure  . Diovan [Valsartan] Other (See Comments)    Palpitations.    . Niacin And Related Other (See Comments)    Turns patient red, makes her pass out  . Solu-Medrol [Methylprednisolone]     Increased HR and  shaking all over    Prior to Admission medications   Medication Sig Start Date End Date Taking? Authorizing Provider  acetaminophen (TYLENOL) 650 MG CR tablet Take 650 mg by mouth every 8 (eight) hours as needed for pain.   Yes [provider]  Ascorbic Acid (VITAMIN C) 1000 MG tablet Take 1,000 mg by mouth daily.   Yes [provider]  aspirin EC 81 MG tablet Take 81 mg by mouth every Monday, Wednesday, and Friday.   Yes [provider]  Calcium Carb-Cholecalciferol (CALCIUM-VITAMIN D) 600-400 MG-UNIT TABS Take 1 tablet by mouth daily.   Yes [provider]  calcium carbonate (TUMS - DOSED IN MG ELEMENTAL CALCIUM) 500 MG chewable tablet Chew 1 tablet by mouth daily as needed for indigestion.    Yes [provider]  Cholecalciferol 4000 units CAPS Take  4,000 Units by mouth daily.   Yes [provider]  escitalopram (LEXAPRO) 10 MG tablet Take 10 mg by mouth every evening.  04/08/13  Yes [provider]  fish oil-omega-3 fatty acids 1000 MG capsule Take 1 g by mouth daily.    Yes [provider]  Flaxseed, Linseed, (FLAXSEED OIL) 1000 MG CAPS Take 1,000 mg by mouth daily.   Yes [provider]  hydrocortisone cream 1 % Apply 1 application topically daily as needed for itching (bug bites).   Yes [provider]  LORazepam (ATIVAN) 0.5 MG tablet Take 0.25 mg by mouth 2 (two) times daily as needed for anxiety.   Yes [provider]  Magnesium 250 MG TABS Take 250 mg by mouth daily.    Yes [provider]  metoprolol (TOPROL-XL) 200 MG 24 hr tablet Take 200 mg by mouth every evening.    Yes [provider]  omeprazole (PRILOSEC) 20 MG capsule Take 20 mg by mouth every other day.   Yes [provider]  pyridOXINE (VITAMIN B-6) 100 MG tablet Take 100 mg by mouth daily.   Yes [provider]  rosuvastatin (CRESTOR) 20 MG tablet Take 20 mg by mouth every evening.    Yes [provider]  diphenhydrAMINE (BENADRYL) 25 MG tablet Take 25 mg by mouth daily as needed for itching (poison oak).    [provider]     Positive ROS: All other systems have been reviewed and were otherwise negative with the exception of those mentioned in the HPI and as above.  Physical Exam: General: Alert, no acute distress Cardiovascular: No pedal edema Respiratory: No cyanosis, no use of accessory musculature GI: No organomegaly, abdomen is soft and non-tender Skin: No lesions in the area of chief complaint Neurologic: Sensation intact distally Psychiatric: Patient is competent for consent with normal mood and affect Lymphatic: No axillary or cervical lymphadenopathy  MUSCULOSKELETAL: Right shoulder active motion 0 to 90 degrees external rotation to -10 cuff  strength is fair the best I can test, positive crepitance.  Assessment: Right shoulder glenohumeral osteoarthritis   Plan: Plan for Procedure(s): RIGHT TOTAL SHOULDER ARTHROPLASTY  The risks benefits and alternatives were discussed with the patient including but not limited to the risks of nonoperative treatment, versus surgical intervention including infection, bleeding, nerve injury,  blood clots, cardiopulmonary complications, morbidity, mortality, among others, and they were willing to proceed.      Johnny Bridge, MD Cell [phone]   11/10/2017 7:09 AM

## 2017-11-10 NOTE — Discharge Instructions (Signed)

## 2017-11-10 NOTE — Op Note (Signed)
11/10/2017  7:49 PM  PATIENT:  Jasmine Swanson    PRE-OPERATIVE DIAGNOSIS: Right shoulder glenohumeral osteoarthritis  POST-OPERATIVE DIAGNOSIS:  Same  PROCEDURE: Right total Shoulder Arthroplasty  SURGEON:  Johnny Bridge, MD  PHYSICIAN ASSISTANT: Joya Gaskins, OPA-C, present and scrubbed throughout the case, critical for completion in a timely fashion, and for retraction, instrumentation, and closure.  ANESTHESIA:   General with an interscalene block  ESTIMATED BLOOD LOSS: 250 mL  UNIQUE ASPECTS OF THE CASE: She had a huge osteophyte complex inferiorly on both the glenoid as well as the humeral head.  The glenoid had rimming osteophyte superiorly, posteriorly, anteroinferiorly, all of which I had to remove.  The glenoid had a massive inferior osteophyte that wrapped all the way around, and I was concerned about the axillary nerve but needed to remove the osteophyte.  I worked within the capsule, not going outside the capsule in order to try and minimize risk to the nerve.  There was fairly severe retroversion, I took down a little bit of the anterior bone, I suspect in the end I had excellent bony contact throughout the anterior surface, the central portion, and the posterior half, the very most posterior fell away due to the pre-existing posterior wear.  The drill holes in the glenoid were contained within the vault superiorly and posteriorly, although it was bicortical anteriorly.  The central hole went just  barely through the medial cortex.  PREOPERATIVE INDICATIONS:  Jasmine Swanson is a  77 y.o. female who failed conservative measures and elected for surgical management.    The risks benefits and alternatives were discussed with the patient preoperatively including but not limited to the risks of infection, bleeding, nerve injury, cardiopulmonary complications, the need for revision surgery, dislocation, loosening, incomplete relief of pain, among others, and the patient was  willing to proceed.  She actually was blocked earlier today, then went to the operating room and then became severely hypertensive, and hypoxic, her surgery was canceled, she was medically evaluated both by anesthesia and the hospitalist service, ultimately it was felt that she may have had some flash pulmonary edema from anxiety and possibly from hemidiaphragm after the block.  When she was optimized we actually went back to surgery the same day later on at the completion of the day.   OPERATIVE IMPLANTS: Biomet size 11 micro press-fit humeral stem, size 42+18 versa-dial humeral head, set in the D position with increased coverage posteriorly, with a small cemented glenoid polyethylene 3 peg implant with a central regenerex noncemented post.   OPERATIVE FINDINGS: Advanced glenohumeral osteoarthritis involving the glenoid and the humeral head with substantial osteophyte formation inferiorly.  She had extreme loss of external rotation.   OPERATIVE PROCEDURE: The patient was brought to the operating room and placed in the supine position. General anesthesia was administered. IV antibiotics were given.  The upper extremity was prepped and draped in usual sterile fashion. The patient was in a beachchair position with all bony prominences padded.   Time out was performed and a deltopectoral approach was carried out. The biceps tendon was tenodesed to the pectoralis tendon. The subscapularis was released, tagging it with a #2 FiberWire, leaving a cuff of tendon for repair.   The inferior osteophyte was removed, and release of the capsule off of the humeral side was completed.  It was fairly challenging to remove all of the inferior osteophyte, as it was extremely bulky and circumferential.  The head was dislocated, and I reamed sequentially. I  placed the humeral cutting guide at 30 of retroversion, and then pinned this into place, and made my humeral neck cut. This was at the appropriate level.   I then  placed deep retractors and exposed the glenoid. I excised the labrum circumferentially, taking care to protect the axillary nerve inferiorly.  I remove the osteophytes off of the glenoid with a rondure, these were also about 1 cm in width and depth throughout.  I then placed a guidewire into the center position, as best as I could achieve given her severe retroversion, controlling appropriate version and inclination. I then reamed over the guidewire with the small reamer, and was satisfied with the preparation. I preserved the subchondral bone in order to maximize the strength and minimize the risk for subsequent subsidence.   I then drilled the central hole for the regenerex peg, and then placed the guide, and then drilled the 3 peripheral peg holes. I had excellent bony circumferential contact.   I then cleaned the glenoid, irrigated it copiously, and then dried it and cemented the prosthesis into place. Excellent seating was achieved. I had full exposure. The cement cured while I turned my attention to the humeral side.   I sequentially broached, up to the selected size, with the broach set at 30 of retroversion. I placed 3 #2 Fiberwire through the bone for subsequent repair.  I then placed the real stem. I trialed with multiple heads, and the above-named component was selected. Increased posterior coverage improved the coverage. The soft tissue tension was appropriate.   I then impacted the real humeral head into place, reduced the head, and irrigated copiously. Excellent stability and range of motion was achieved. I repaired the subscapularis with a total of 5 #2 FiberWire; one for the interval, one for the corner, and then the remaining three from the lesser tuberosity which had already been passed.  Excellent repair achieved and I irrigated copiously once more. The subcutaneous tissue was closed with Vicryl including the deltopectoral fascia.   The skin was closed with Steri-Strips and sterile  gauze was applied. She had a preoperative nerve block. She tolerated the procedure well and there were no complications.

## 2017-11-10 NOTE — Op Note (Signed)
The patient was brought to the operating room at approximately 7:30 in the morning, underwent induction of anesthesia, she became extremely hypertensive as well as hypoxic, she was resuscitated, but surgery canceled/postponed.  She returned to the PACU, we never actually did the operation that morning, but was subsequently optimized and return to the operating room later that day for the definitive operation.  Please see valvular operative report for details.

## 2017-11-10 NOTE — Anesthesia Procedure Notes (Signed)
Procedure Name: Intubation Date/Time: 11/10/2017 5:29 PM Performed by: Clearnce Sorrel, CRNA Pre-anesthesia Checklist: Patient identified, Emergency Drugs available, Suction available, Patient being monitored and Timeout performed Patient Re-evaluated:Patient Re-evaluated prior to induction Oxygen Delivery Method: Circle system utilized Preoxygenation: Pre-oxygenation with 100% oxygen Induction Type: IV induction Ventilation: Mask ventilation without difficulty Laryngoscope Size: Mac and 3 Grade View: Grade I Tube type: Oral Tube size: 7.0 mm Number of attempts: 1 Airway Equipment and Method: Stylet Placement Confirmation: ETT inserted through vocal cords under direct vision,  positive ETCO2 and breath sounds checked- equal and bilateral Secured at: 22 cm Tube secured with: Tape Dental Injury: Teeth and Oropharynx as per pre-operative assessment

## 2017-11-10 NOTE — Transfer of Care (Signed)
Immediate Anesthesia Transfer of Care Note  Patient: Jasmine Swanson  Procedure(s) Performed: CANCELLED PROCEDURE (N/A )  Patient Location: PACU  Anesthesia Type:General  Level of Consciousness: drowsy and patient cooperative  Airway & Oxygen Therapy: Patient Spontanous Breathing and Patient connected to face mask oxygen  Post-op Assessment: Report given to RN and Post -op Vital signs reviewed and unstable, Anesthesiologist notified  Post vital signs: Reviewed and stable  Last Vitals:  Vitals Value Taken Time  BP 129/57 11/10/2017  8:19 AM  Temp    Pulse 78 11/10/2017  8:21 AM  Resp 16 11/10/2017  8:21 AM  SpO2 95 % 11/10/2017  8:21 AM  Vitals shown include unvalidated device data.  Last Pain:  Vitals:   11/10/17 0541  TempSrc: Oral         Complications: No apparent anesthesia complications

## 2017-11-10 NOTE — Anesthesia Procedure Notes (Signed)
Anesthesia Regional Block: Interscalene brachial plexus block   Pre-Anesthetic Checklist: ,, timeout performed, Correct Patient, Correct Site, Correct Laterality, Correct Procedure, Correct Position, site marked, Risks and benefits discussed,  Surgical consent,  Pre-op evaluation,  At surgeon's request and post-op pain management  Laterality: Upper and Right  Prep: chloraprep       Needles:  Injection technique: Single-shot  Needle Type: Echogenic Stimulator Needle          Additional Needles:   Procedures:,,,, ultrasound used (permanent image in chart),,,,  Narrative:  Start time: 11/10/2017 7:14 AM End time: 11/10/2017 7:20 AM Injection made incrementally with aspirations every 5 mL.  Performed by: Personally  Anesthesiologist: Oleta Mouse, MD  Additional Notes: H+P and labs reviewed, risks and benefits discussed with patient, procedure tolerated well without complications

## 2017-11-11 ENCOUNTER — Encounter (HOSPITAL_COMMUNITY): Payer: Self-pay | Admitting: Orthopedic Surgery

## 2017-11-11 DIAGNOSIS — J9601 Acute respiratory failure with hypoxia: Secondary | ICD-10-CM

## 2017-11-11 LAB — BASIC METABOLIC PANEL
ANION GAP: 7 (ref 5–15)
BUN: 10 mg/dL (ref 6–20)
CO2: 24 mmol/L (ref 22–32)
Calcium: 8.5 mg/dL — ABNORMAL LOW (ref 8.9–10.3)
Chloride: 106 mmol/L (ref 101–111)
Creatinine, Ser: 0.72 mg/dL (ref 0.44–1.00)
GFR calc Af Amer: >60 mL/min (ref >60)
Glucose, Bld: 199 mg/dL — ABNORMAL HIGH (ref 65–99)
POTASSIUM: 3.5 mmol/L (ref 3.5–5.1)
SODIUM: 137 mmol/L (ref 135–145)

## 2017-11-11 LAB — CBC
HEMATOCRIT: 33.3 % — AB (ref 36.0–46.0)
HEMOGLOBIN: 11.3 g/dL — AB (ref 12.0–15.0)
MCH: 30.9 pg (ref 26.0–34.0)
MCHC: 33.9 g/dL (ref 30.0–36.0)
MCV: 91 fL (ref 78.0–100.0)
Platelets: 155 10*3/uL (ref 150–400)
RBC: 3.66 MIL/uL — ABNORMAL LOW (ref 3.87–5.11)
RDW: 11.9 % (ref 11.5–15.5)
WBC: 7.2 10*3/uL (ref 4.0–10.5)

## 2017-11-11 MED ORDER — KETOROLAC TROMETHAMINE 10 MG PO TABS: 10.0000 mg | ORAL_TABLET | Freq: Four times a day (QID) | ORAL | 0 refills | Status: DC | PRN

## 2017-11-11 MED ORDER — DOCUSATE SODIUM 100 MG PO CAPS: 100.0000 mg | ORAL_CAPSULE | Freq: Two times a day (BID) | ORAL | 0 refills | Status: DC

## 2017-11-11 MED ORDER — HYDROCODONE-ACETAMINOPHEN 5-325 MG PO TABS: 1.0000 | ORAL_TABLET | ORAL | 0 refills | Status: DC | PRN

## 2017-11-11 MED ORDER — POLYETHYLENE GLYCOL 3350 17 G PO PACK: 17.0000 g | PACK | Freq: Every day | ORAL | 0 refills | Status: DC | PRN

## 2017-11-11 NOTE — Evaluation (Signed)
Occupational Therapy Evaluation Patient Details Name: Jasmine Swanson MRN: [id number] DOB: 1940-06-08 Today's Date: 11/11/2017    History of Present Illness Pt is a 77 y.o. female s/p R total shoulder arthroplasty. Pre-operative course complicated by acute respiratory failure with hypoxia in the setting of severe HTN. Pt was able to proceed with surgery 11/10/17. PMH significant for PAT, HTN, HLD, anxiety, and depression.    Clinical Impression   PTA, pt was independent with ADL and functional mobility. Provided all education concerning conservative shoulder protocol including compensatory ADL strategies, sling wear schedule, and HEP for elbow/wrist/hand AROM. Pt requiring min assist for LB dressing and mod assist for UB dressing this session while adhering to shoulder precautions and is able to verbalize appropriate methods. Her daughter will be able to assist with all ADLs 24 hours/day for approximately 10 days post-acute D/C. Pt able to complete ambulating toilet and shower transfers with overall supervision. All OT education is complete at this time and recommend progression of shoulder rehabilitation per MD recommendation. Acute OT will sign off.     Follow Up Recommendations  Follow surgeon's recommendation for DC plan and follow-up therapies;Supervision/Assistance - 24 hour    Equipment Recommendations  None recommended by OT    Recommendations for Other Services       Precautions / Restrictions Precautions Precautions: Shoulder Type of Shoulder Precautions: Conservative protocol: NWB RUE, sling at all times except ADL/exercise, AROM elbow/wrist/hand, no AROM/PROM R shoulder.  Shoulder Interventions: Shoulder sling/immobilizer Precaution Booklet Issued: Yes (comment) Precaution Comments: Educated pt thoroughly concerning post-operative shoulder precautions.  Required Braces or Orthoses: Sling(R shoulder sling/immobilizer) Restrictions Weight Bearing Restrictions: Yes RUE Weight  Bearing: Non weight bearing      Mobility Bed Mobility Overal bed mobility: Modified Independent                Transfers Overall transfer level: Needs assistance   Transfers: Sit to/from Stand Sit to Stand: Supervision         General transfer comment: Supervision for safety during functional mobility.     Balance Overall balance assessment: No apparent balance deficits (not formally assessed)                                         ADL either performed or assessed with clinical judgement   ADL Overall ADL's : Needs assistance/impaired Eating/Feeding: Sitting;Set up   Grooming: Supervision/safety;Standing   Upper Body Bathing: Minimal assistance;Sitting   Lower Body Bathing: Supervison/ safety;Sit to/from stand   Upper Body Dressing : Sitting;Moderate assistance   Lower Body Dressing: Sit to/from stand;Minimal assistance Lower Body Dressing Details (indicate cue type and reason): assist only due to pants getting stuck over socks Toilet Transfer: Supervision/safety;Ambulation   Toileting- Clothing Manipulation and Hygiene: Supervision/safety;Sit to/from stand   Tub/ Shower Transfer: Walk-in shower;Supervision/safety;Ambulation;Shower seat   Functional mobility during ADLs: Supervision/safety General ADL Comments: Pt educated thoroughly concerning compensatory strategies for all ADL as well as conservative shoulder precautions.      Vision Patient Visual Report: No change from baseline Vision Assessment?: No apparent visual deficits     Perception     Praxis      Pertinent Vitals/Pain Pain Assessment: No/denies pain     Hand Dominance     Extremity/Trunk Assessment Upper Extremity Assessment Upper Extremity Assessment: RUE deficits/detail RUE Deficits / Details: Block remains effective. Minimal digit AROM. Sensation returning with tingling in median  nerve distribution. No pain as of yet. Limited by no motion of shoulder per  protocol.  RUE Sensation: decreased light touch   Lower Extremity Assessment Lower Extremity Assessment: Overall WFL for tasks assessed       Communication Communication Communication: No difficulties   Cognition Arousal/Alertness: Awake/alert Behavior During Therapy: WFL for tasks assessed/performed Overall Cognitive Status: Within Functional Limits for tasks assessed                                     General Comments  Pt's close friend arriving at end of session.     Exercises Exercises: Shoulder Shoulder Exercises Elbow Flexion: AAROM;Right;10 reps;Standing Elbow Extension: AAROM;Right;10 reps;Standing Wrist Flexion: AAROM;Right;10 reps;Seated Wrist Extension: AAROM;Right;10 reps;Seated Digit Composite Flexion: AROM;Right;10 reps;Seated(unable to achieve full fist) Composite Extension: AROM;Right;10 reps;Seated(unable to achieve full fist)   Shoulder Instructions Shoulder Instructions Donning/doffing shirt without moving shoulder: Moderate assistance Method for sponge bathing under operated UE: Minimal assistance Donning/doffing sling/immobilizer: Moderate assistance Correct positioning of sling/immobilizer: Supervision/safety ROM for elbow, wrist and digits of operated UE: Supervision/safety Sling wearing schedule (on at all times/off for ADL's): Supervision/safety Positioning of UE while sleeping: [employer] expects to be discharged to:: Private residence Living Arrangements: Alone(daughrer coming to stay with her) Available Help at Discharge: Family;Available 24 hours/day(daughter) Type of Home: House Home Access: Stairs to enter [employer] of Steps: 6 Entrance Stairs-Rails: Right;Left Home Layout: One level     Bathroom Shower/Tub: Occupational psychologist: Standard     Home Equipment: Shower seat          Prior Functioning/Environment Level of Independence: Independent         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        OT Problem List: Decreased activity tolerance;Impaired balance (sitting and/or standing);Decreased range of motion;Decreased safety awareness;Decreased knowledge of use of DME or AE;Decreased knowledge of precautions;Pain;Impaired UE functional use      OT Treatment/Interventions:      OT Goals(Current goals can be found in the care plan section) Acute Rehab OT Goals Patient Stated Goal: get home OT Goal Formulation: With patient Time For Goal Achievement: 11/25/17 Potential to Achieve Goals: Good  OT Frequency:     Barriers to D/C:            Co-evaluation              AM-PAC PT "6 Clicks" Daily Activity     Outcome Measure Help from another person eating meals?: None Help from another person taking care of personal grooming?: A Little Help from another person toileting, which includes using toliet, bedpan, or urinal?: A Little Help from another person bathing (including washing, rinsing, drying)?: A Little Help from another person to put on and taking off regular upper body clothing?: A Lot Help from another person to put on and taking off regular lower body clothing?: A Little 6 Click Score: 18   End of Session Equipment Utilized During Treatment: Other (comment)(R shoulder sling) Nurse Communication: Mobility status;Other (comment)(request to saline lock IV for dressing practice)  Activity Tolerance: Patient tolerated treatment well Patient left: in bed;with call bell/phone within reach;with family/visitor present(seated at EOB)  OT Visit Diagnosis: Other abnormalities of gait and mobility (R26.89);Pain Pain - Right/Left: Right Pain - part of body: Shoulder                Time:  1443-1540 OT Time Calculation (min): 30 min Charges:  OT General Charges $OT Visit: 1 Visit OT Evaluation $OT Eval Moderate Complexity: 1 Mod OT Treatments $Self Care/Home Management : 8-22 mins G-Codes:     Norman Herrlich, MS OTR/L  Pager:  Jasmine Swanson 11/11/2017, 11:07 AM

## 2017-11-11 NOTE — Discharge Summary (Signed)
Physician Discharge Summary  Patient ID: Jasmine Swanson MRN: [id number] DOB/AGE: [birth year] 77 y.o.  Admit date: 11/10/2017 Discharge date: 11/11/2017  Admission Diagnoses:  Acute respiratory failure [facility]) Right shoulder primary localized osteoarthritis  Discharge Diagnoses:  Principal Problem:   Acute respiratory failure ([place]) Active Problems:   Right shoulder pain   Essential hypertension   Hyperlipidemia   Primary localized osteoarthrosis of right shoulder   Past Medical History:  Diagnosis Date  . Anxiety   . Arthritis   . Bundle branch block, left   . Complication of anesthesia    laryngeal spams 50 yrs ago  . Depression   . Dysphagia   . Essential hypertension 06/17/2013  . GERD (gastroesophageal reflux disease)   . Hyperlipidemia   . Kyphosis   . PAC (premature atrial contraction) 06/17/2013  . PAT (paroxysmal atrial tachycardia) ([place]) 06/17/2013  . Plantar fasciitis of right foot   . Preop cardiovascular exam 10/16/2017  . Primary localized osteoarthrosis of right shoulder 11/10/2017  . Right shoulder pain 01/20/2012    Surgeries: Procedure(s): TOTAL SHOULDER ARTHROPLASTY on 11/10/2017   Consultants (if any):   Discharged Condition: Improved  Hospital Course: Jasmine Swanson is an 77 y.o. female who was admitted 11/10/2017 with a diagnosis of Acute respiratory failure ([place]) and went to the operating room on 11/10/2017 and underwent the above named procedures.  Initially upon induction she became hypertensive, and hypoxic, the surgery was postponed, she returned to the PACU, was resuscitated, medical consultation/admission ordered, appreciate their help.  She actually ended up turning around, and then was brought to surgery later on that same day for definitive total shoulder replacement.  She tolerated the procedure well.  She was given perioperative antibiotics:  Anti-infectives (From admission, onward)   Start     Dose/Rate Route Frequency Ordered Stop    11/10/17 2330  ceFAZolin (ANCEF) IVPB 1 g/50 mL premix     1 g 100 mL/hr over 30 Minutes Intravenous Every 6 hours 11/10/17 2219 11/11/17 1729   11/10/17 0615  ceFAZolin (ANCEF) IVPB 2g/100 mL premix  Status:  Discontinued     2 g 200 mL/hr over 30 Minutes Intravenous On call to O.R. 11/10/17 0601 11/10/17 0909   11/10/17 0603  ceFAZolin (ANCEF) 2-4 GM/100ML-% IVPB    Note to Pharmacy:  Starleen Arms   : cabinet override      11/10/17 0603 11/10/17 1814    .  She was given sequential compression devices, early ambulation, and Lovenox for DVT prophylaxis.  She benefited maximally from the hospital stay and there were no complications.    Recent vital signs:  Vitals:   11/11/17 0615 11/11/17 1219  BP: (!) 142/60 (!) 116/53  Pulse: 77 80  Resp: 12 14  Temp: 98.1 F (36.7 C) 98.5 F (36.9 C)  SpO2: 97% 98%    Recent laboratory studies:  Lab Results  Component Value Date   HGB 11.3 (L) 11/11/2017   HGB 12.2 11/10/2017   HGB 13.5 10/30/2017   Lab Results  Component Value Date   WBC 7.2 11/11/2017   PLT 155 11/11/2017   No results found for: INR Lab Results  Component Value Date   NA 137 11/11/2017   K 3.5 11/11/2017   CL 106 11/11/2017   CO2 24 11/11/2017   BUN 10 11/11/2017   CREATININE 0.72 11/11/2017   GLUCOSE 199 (H) 11/11/2017    Discharge Medications:   Allergies as of 11/11/2017      Reactions  Benicar Hct [olmesartan Medoxomil-hctz]    Edema of lower extremities    Codeine Other (See Comments)   Drops blood pressure   Diovan [valsartan] Other (See Comments)   Palpitations.     Niacin And Related Other (See Comments)   Turns patient red, makes her pass out   Solu-medrol [methylprednisolone]    Increased HR and shaking all over       Medication List    TAKE these medications   acetaminophen 650 MG CR tablet Commonly known as:  TYLENOL Take 650 mg by mouth every 8 (eight) hours as needed for pain.   aspirin EC 81 MG tablet Take 81 mg by  mouth every Monday, Wednesday, and Friday.   calcium carbonate 500 MG chewable tablet Commonly known as:  TUMS - dosed in mg elemental calcium Chew 1 tablet by mouth daily as needed for indigestion.   Calcium-Vitamin D 600-400 MG-UNIT Tabs Take 1 tablet by mouth daily.   Cholecalciferol 4000 units Caps Take 4,000 Units by mouth daily.   diphenhydrAMINE 25 MG tablet Commonly known as:  BENADRYL Take 25 mg by mouth daily as needed for itching (poison oak).   docusate sodium 100 MG capsule Commonly known as:  COLACE Take 1 capsule (100 mg total) by mouth 2 (two) times daily.   escitalopram 10 MG tablet Commonly known as:  LEXAPRO Take 10 mg by mouth every evening.   fish oil-omega-3 fatty acids 1000 MG capsule Take 1 g by mouth daily.   Flaxseed Oil 1000 MG Caps Take 1,000 mg by mouth daily.   HYDROcodone-acetaminophen 5-325 MG tablet Commonly known as:  NORCO/VICODIN Take 1-2 tablets by mouth every 4 (four) hours as needed for moderate pain (pain score 4-6).   hydrocortisone cream 1 % Apply 1 application topically daily as needed for itching (bug bites).   ketorolac 10 MG tablet Commonly known as:  TORADOL Take 1 tablet (10 mg total) by mouth every 6 (six) hours as needed for moderate pain.   LORazepam 0.5 MG tablet Commonly known as:  ATIVAN Take 0.25 mg by mouth 2 (two) times daily as needed for anxiety.   Magnesium 250 MG Tabs Take 250 mg by mouth daily.   metoprolol 200 MG 24 hr tablet Commonly known as:  TOPROL-XL Take 200 mg by mouth every evening.   omeprazole 20 MG capsule Commonly known as:  PRILOSEC Take 20 mg by mouth every other day.   polyethylene glycol packet Commonly known as:  MIRALAX / GLYCOLAX Take 17 g by mouth daily as needed for mild constipation.   pyridOXINE 100 MG tablet Commonly known as:  VITAMIN B-6 Take 100 mg by mouth daily.   rosuvastatin 20 MG tablet Commonly known as:  CRESTOR Take 20 mg by mouth every evening.    vitamin C 1000 MG tablet Take 1,000 mg by mouth daily.       Diagnostic Studies: Dg Chest Port 1 View  Result Date: 11/10/2017 CLINICAL DATA:  Preoperative evaluation for shoulder surgery, hypoxia, increased blood pressure, wheezing, history essential hypertension, GERD EXAM: PORTABLE CHEST 1 VIEW COMPARISON:  Portable exam 0812 hours compared to 10/05/2006 FINDINGS: Upper normal heart size. Atherosclerotic calcification aorta. Mediastinal contour stable. Patient slightly rotated. Hazy RIGHT upper lobe infiltrate with additional mild infiltrate versus atelectasis at lung bases. No pleural effusion or pneumothorax. Bones demineralized with bulky spurs versus calcified loose bodies in inferior to the RIGHT glenohumeral joint. IMPRESSION: Hazy RIGHT upper lobe infiltrate with additional mild atelectasis versus infiltrate at  lung bases bilaterally. Electronically Signed   By: Lavonia Dana M.D.   On: 11/10/2017 08:29   Dg Shoulder Right Port  Result Date: 11/11/2017 CLINICAL DATA:  Right shoulder arthroplasty EXAM: PORTABLE RIGHT SHOULDER COMPARISON:  11/10/2017 CXR FINDINGS: New right shoulder arthroplasty is noted without complicating features. No postoperative fracture or hardware failure. Trace fluid along a right lung fissure versus atelectasis. IMPRESSION: New right shoulder arthroplasty without postop complicating features. Electronically Signed   By: Ashley Royalty M.D.   On: 11/11/2017 02:47    Disposition: Discharge disposition: 01-Home or Self Care       Discharge Instructions    Call MD for:  difficulty breathing, headache or visual disturbances   Complete by:  As directed    Call MD for:  extreme fatigue   Complete by:  As directed    Call MD for:  persistant dizziness or light-headedness   Complete by:  As directed    Call MD for:  persistant nausea and vomiting   Complete by:  As directed    Call MD for:  severe uncontrolled pain   Complete by:  As directed    Call MD for:   temperature >100.4   Complete by:  As directed    Discharge instructions   Complete by:  As directed    Please follow-up with your primary care provider within 1 week.  Seek attention immediately if shortness of breath recurs.  You were cared for by a hospitalist during your hospital stay. If you have any questions about your discharge medications or the care you received while you were in the hospital after you are discharged, you can call the unit and asked to speak with the hospitalist on call if the hospitalist that took care of you is not available. Once you are discharged, your primary care physician will handle any further medical issues. Please note that NO REFILLS for any discharge medications will be authorized once you are discharged, as it is imperative that you return to your primary care physician (or establish a relationship with a primary care physician if you do not have one) for your aftercare needs so that they can reassess your need for medications and monitor your lab values. If you do not have a primary care physician, you can call [phone] for a physician referral.   Increase activity slowly   Complete by:  As directed       Follow-up Information    Marchia Bond, MD. Schedule an appointment as soon as possible for a visit in 2 weeks.   Specialty:  Orthopedic Surgery Contact information: [place] [address] [phone]        Cari Caraway, MD. Schedule an appointment as soon as possible for a visit in 1 week(s).   Specialty:  Family Medicine Contact information: [place] [address] [phone]            Signed: Johnny Bridge 11/11/2017, 12:30 PM

## 2017-11-11 NOTE — Progress Notes (Addendum)
Patient ID: Jasmine Swanson, female   DOB: [birth year], 77 y.o.   MRN: [id number]     Subjective:  Patient reports pain as mild.  Patient has not had any pain to this point due to the block but motor and sensory function returning.  Objective:   VITALS:   Vitals:   11/10/17 2157 11/10/17 2216 11/11/17 0038 11/11/17 0615  BP: (!) 133/58 (!) 139/56 (!) 140/51 (!) 142/60  Pulse: 82 81 90 77  Resp: 16  13 12   Temp:  98.6 F (37 C) 98.2 F (36.8 C) 98.1 F (36.7 C)  TempSrc:  Oral Oral Oral  SpO2: 96% 96% 96% 97%  Weight:      Height:        ABD soft Sensation intact distally Dorsiflexion/Plantar flexion intact Incision: dressing C/D/I and no drainage Sling at all times  Lab Results  Component Value Date   WBC 7.2 11/11/2017   HGB 11.3 (L) 11/11/2017   HCT 33.3 (L) 11/11/2017   MCV 91.0 11/11/2017   PLT 155 11/11/2017   BMET    Component Value Date/Time   NA 137 11/11/2017 0408   K 3.5 11/11/2017 0408   CL 106 11/11/2017 0408   CO2 24 11/11/2017 0408   GLUCOSE 199 (H) 11/11/2017 0408   BUN 10 11/11/2017 0408   CREATININE 0.72 11/11/2017 0408   CALCIUM 8.5 (L) 11/11/2017 0408   GFRNONAA >60 11/11/2017 0408   GFRAA >60 11/11/2017 0408     Assessment/Plan: 1 Day Post-Op   Principal Problem:   Acute respiratory failure ([facility]) Active Problems:   Right shoulder pain   Essential hypertension   Hyperlipidemia   Primary localized osteoarthrosis of right shoulder   Advance diet Up with therapy Continue plan per medicine NWB right upper ext  Sling at all times Patient ok foe DC when clear with medicine     Remonia Richter 11/11/2017, 10:05 AM  Okay for discharge today, seemingly doing well, appreciate medical assistance.  Marchia Bond, MD Cell [phone]

## 2017-11-11 NOTE — Anesthesia Postprocedure Evaluation (Signed)
Anesthesia Post Note  Patient: Jasmine Swanson  Procedure(s) Performed: CANCELLED PROCEDURE (N/A )     Patient location during evaluation: PACU Anesthesia Type: Regional and MAC Level of consciousness: awake and patient cooperative Pain management: pain level controlled Vital Signs Assessment: post-procedure vital signs reviewed and stable Respiratory status: spontaneous breathing Cardiovascular status: stable and blood pressure returned to baseline Postop Assessment: no apparent nausea or vomiting Anesthetic complications: yes Anesthetic complication details: adverse drug reaction and respiratory eventComments: Patient on BiPAP with improved respiratory effort and tidal volumes. Remains tachypneic with shortness of breath. 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    Last Vitals:  Vitals:   11/11/17 0615 11/11/17 1219  BP: (!) 142/60 (!) 116/53  Pulse: 77 80  Resp: 12 14  Temp: 36.7 C 36.9 C  SpO2: 97% 98%    Last Pain:  Vitals:   11/11/17 1219  TempSrc: Oral  PainSc:                  [name]

## 2017-11-11 NOTE — Progress Notes (Signed)
[facility] PROGRESS NOTE  Jasmine Swanson JGG:[id number] DOB: 02/12/1941 DOA: 11/10/2017  PCP: Cari Caraway, MD  Brief History/Interval Summary: 77 y.o. female with a Past Medical History of PAT; HTN; HLD; and depression/anxiety who presented for routine right shoulder arthroplasty.  Per Dr. Mardelle Matte, the patient was having a pre-op block and became hypoxic in the 30s.  She did not undergo general anesthesia due to persistent hypoxia and marked BP elevations into the 200s.  She was given medications and started on O2.  She was eventually placed on BIPAP with improvement.    Reason for Visit: Hypoxia  Consultants: Orthopedic  Procedures: Right total Shoulder Arthroplasty   Subjective/Interval History: Patient states that she feels much better this morning.  Denies any shortness of breath.  No coughing.  Still feels a little tired but much better compared to yesterday.  ROS:.  No chest pain.  Objective:  Vital Signs  Vitals:   11/10/17 2216 11/11/17 0038 11/11/17 0615 11/11/17 1219  BP: (!) 139/56 (!) 140/51 (!) 142/60 (!) 116/53  Pulse: 81 90 77 80  Resp:  13 12 14   Temp: 98.6 F (37 C) 98.2 F (36.8 C) 98.1 F (36.7 C) 98.5 F (36.9 C)  TempSrc: Oral Oral Oral Oral  SpO2: 96% 96% 97% 98%  Weight:      Height:        Intake/Output Summary (Last 24 hours) at 11/11/2017 1302 Last data filed at 11/11/2017 0900 Gross per 24 hour  Intake 520.83 ml  Output -  Net 520.83 ml   Filed Weights   11/10/17 0541  Weight: 60.8 kg (134 lb)    General appearance: alert, cooperative, appears stated age and no distress Head: Normocephalic, without obvious abnormality, atraumatic Resp: Very few crackles at the bases.  Normal effort at rest.  No wheezing.  No rhonchi. Cardio: regular rate and rhythm, S1, S2 normal, no murmur, click, rub or gallop GI: soft, non-tender; bowel sounds normal; no masses,  no organomegaly Neurologic: No focal deficit  Lab Results:  Data  Reviewed: I have personally reviewed following labs and imaging studies  CBC: Recent Labs  Lab 11/10/17 2113 11/11/17 0408  WBC 7.2 7.2  HGB 12.2 11.3*  HCT 37.1 33.3*  MCV 92.8 91.0  PLT 149* 546    Basic Metabolic Panel: Recent Labs  Lab 11/10/17 2113 11/11/17 0408  NA  --  137  K  --  3.5  CL  --  106  CO2  --  24  GLUCOSE  --  199*  BUN  --  10  CREATININE 0.76 0.72  CALCIUM  --  8.5*    GFR: Estimated Creatinine Clearance: 46.9 mL/min (by C-G formula based on SCr of 0.72 mg/dL).   Cardiac Enzymes: Recent Labs  Lab 11/10/17 1006 11/10/17 1435 11/10/17 2113  TROPONINI <0.03 <0.03 <0.03      Radiology Studies: Dg Chest Port 1 View  Result Date: 11/10/2017 CLINICAL DATA:  Preoperative evaluation for shoulder surgery, hypoxia, increased blood pressure, wheezing, history essential hypertension, GERD EXAM: PORTABLE CHEST 1 VIEW COMPARISON:  Portable exam 0812 hours compared to 10/05/2006 FINDINGS: Upper normal heart size. Atherosclerotic calcification aorta. Mediastinal contour stable. Patient slightly rotated. Hazy RIGHT upper lobe infiltrate with additional mild infiltrate versus atelectasis at lung bases. No pleural effusion or pneumothorax. Bones demineralized with bulky spurs versus calcified loose bodies in inferior to the RIGHT glenohumeral joint. IMPRESSION: Hazy RIGHT upper lobe infiltrate with additional mild atelectasis versus infiltrate at lung bases  bilaterally. Electronically Signed   By: Lavonia Dana M.D.   On: 11/10/2017 08:29   Dg Shoulder Right Port  Result Date: 11/11/2017 CLINICAL DATA:  Right shoulder arthroplasty EXAM: PORTABLE RIGHT SHOULDER COMPARISON:  11/10/2017 CXR FINDINGS: New right shoulder arthroplasty is noted without complicating features. No postoperative fracture or hardware failure. Trace fluid along a right lung fissure versus atelectasis. IMPRESSION: New right shoulder arthroplasty without postop complicating features.  Electronically Signed   By: Ashley Royalty M.D.   On: 11/11/2017 02:47     Medications:  Scheduled: . acetaminophen  500 mg Oral Q6H  . docusate sodium  100 mg Oral BID  . enoxaparin (LOVENOX) injection  40 mg Subcutaneous Q24H  . escitalopram  10 mg Oral QPM  . ketorolac  7.5 mg Intravenous Q6H  . metoprolol  200 mg Oral QPM  . pantoprazole  40 mg Oral Daily  . rosuvastatin  20 mg Oral QPM  . traMADol  50 mg Oral Q6H   Continuous: .  ceFAZolin (ANCEF) IV 1 g (11/11/17 0602)  . lactated ringers 10 mL/hr at 11/10/17 1555  . methocarbamol (ROBAXIN)  IV     YIR:[id number], alum & mag hydroxide-simeth, bisacodyl, diphenhydrAMINE, hydrALAZINE, HYDROcodone-acetaminophen, LORazepam, magnesium citrate, menthol-cetylpyridinium **OR** phenol, methocarbamol **OR** methocarbamol (ROBAXIN)  IV, metoCLOPramide **OR** metoCLOPramide (REGLAN) injection, morphine injection, ondansetron **OR** ondansetron (ZOFRAN) IV, polyethylene glycol, senna-docusate, zolpidem  Assessment/Plan:    Acute respiratory failure with hypoxia This occurred when the patient was given her block for surgery.  She became quite hypoxic.  There was concern for flash pulmonary edema.  Patient was given 1 dose of Lasix.  She was placed on BiPAP.  She quickly improved.  Then she underwent surgery later in the day without any difficulties.  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  This is probably the likely explanation.  EKG shows LBBB which is chronic.  She was seen recently by cardiology and they cleared her for surgery.  As per the cardiology note she had a cardiac catheterization in 2012 which did not show any coronary artery disease.  Patient is now saturating normal on room air.  Does not need any further inpatient work-up.  She has been seen by physical therapy.  From a medical standpoint she is cleared for discharge.   She will need to follow-up with her primary care provider.  Her other medical issues including hypertension hyperlipidemia all stable.  This morning she is noted to be quite comfortable.  Her right shoulder is in a splint.  Further management of this deferred to orthopedics.    LOS: 1 day   [facility] Pager [phone] 11/11/2017, 1:02 PM  If 7PM-7AM, please contact night-coverage at [email or web address], password [facility]

## 2017-11-11 NOTE — Plan of Care (Signed)
  Problem: Education: Goal: Knowledge of General Education information will improve Outcome: Progressing   

## 2017-12-21 DIAGNOSIS — M19011 Primary osteoarthritis, right shoulder: Secondary | ICD-10-CM | POA: Diagnosis not present

## 2017-12-22 ENCOUNTER — Ambulatory Visit: Payer: Medicare Other | Attending: Orthopedic Surgery | Admitting: Physical Therapy

## 2017-12-22 ENCOUNTER — Encounter: Payer: Self-pay | Admitting: Physical Therapy

## 2017-12-22 ENCOUNTER — Other Ambulatory Visit: Payer: Self-pay

## 2017-12-22 DIAGNOSIS — M25511 Pain in right shoulder: Secondary | ICD-10-CM | POA: Insufficient documentation

## 2017-12-22 DIAGNOSIS — M25611 Stiffness of right shoulder, not elsewhere classified: Secondary | ICD-10-CM | POA: Insufficient documentation

## 2017-12-22 DIAGNOSIS — R29898 Other symptoms and signs involving the musculoskeletal system: Secondary | ICD-10-CM | POA: Diagnosis present

## 2017-12-22 DIAGNOSIS — M6281 Muscle weakness (generalized): Secondary | ICD-10-CM | POA: Diagnosis present

## 2017-12-22 NOTE — Therapy (Signed)
[facility] [address]  [place], [place], [address] Phone: [phone]   Fax:  [phone]  Physical Therapy Evaluation  Patient Details  Name: Jasmine Swanson MRN: [id number] Date of Birth: 07-Nov-1940 Referring Provider: Marchia Bond, MD   Encounter Date: 12/22/2017  PT End of Session - 12/22/17 1805    Visit Number  1    Number of Visits  17    Date for PT Re-Evaluation  02/16/18    Authorization Type  [employer]    PT Start Time  1357    PT Stop Time  1444    PT Time Calculation (min)  47 min    Activity Tolerance  Patient tolerated treatment well    Behavior During Therapy  [facility] for tasks assessed/performed       Past Medical History:  Diagnosis Date  . Anxiety   . Arthritis   . Bundle branch block, left   . Complication of anesthesia    laryngeal spams 50 yrs ago  . Depression   . Dysphagia   . Essential hypertension 06/17/2013  . GERD (gastroesophageal reflux disease)   . Hyperlipidemia   . Kyphosis   . PAC (premature atrial contraction) 06/17/2013  . PAT (paroxysmal atrial tachycardia) ([place]) 06/17/2013  . Plantar fasciitis of right foot   . Preop cardiovascular exam 10/16/2017  . Primary localized osteoarthrosis of right shoulder 11/10/2017  . Right shoulder pain 01/20/2012    Past Surgical History:  Procedure Laterality Date  . ABDOMINAL HYSTERECTOMY    . bladder tac    . CARDIAC CATHETERIZATION    . TONSILLECTOMY    . TOTAL SHOULDER ARTHROPLASTY Right 11/10/2017   Procedure: TOTAL SHOULDER ARTHROPLASTY;  Surgeon: Marchia Bond, MD;  Location: [place];  Service: Orthopedics;  Laterality: Right;    There were no vitals filed for this visit.   Subjective Assessment - 12/22/17 1400    Subjective  Patient reports undergoing R TSA on 11/10/17. Had follow up with MD yesterday- released her from the sling, only using PRN in crowded environments. Reports she has not had much pain since the  surgery. Iced R shoulder early on and has not used prescription pain meds.  Reports difficulty putting on bra, vacuuming, making bed, driving. Also wants to get back into shag dancing since R UE is arm used for turning.     Pertinent History  R shoulder pain, R plantar fasciitis, PAC and PAT, kyphosis, HLD, GERD, HTN, dysphagia, LBBB, cardiac cath    Limitations  House hold activities;Lifting    Diagnostic tests  11/11/17 R shoulder xray: New right shoulder arthroplasty without postop complicating features    Patient Stated Goals  i want to get back to exercising and dancing     Currently in Pain?  No/denies    Pain Location  Shoulder    Pain Orientation  Right    Pain Descriptors / Indicators  Aching    Pain Type  Acute pain;Surgical pain    Aggravating Factors   picking up cat    Pain Relieving Factors  ice, tylenol         [facility] PT Assessment - 12/22/17 1414      Assessment   Medical Diagnosis  s/p R total shoulder replacement    Referring Provider  Marchia Bond, MD    Onset Date/Surgical Date  11/10/17    Hand Dominance  Right    Next MD Visit  01/18/18  Prior Therapy  Yes, for R shoulder      Precautions   Precautions  Shoulder    Precaution Comments  no AROM, no lifting, no pushing up with body weight, no excessive IR, prop UE on pillow when supine      Restrictions   Weight Bearing Restrictions  Yes    RUE Weight Bearing  -- no pushing up through R UE      Balance Screen   Has the patient fallen in the past 6 months  No    Has the patient had a decrease in activity level because of a fear of falling?   No being careful    Is the patient reluctant to leave their home because of a fear of falling?   No      Home Social worker  Private residence    Living Arrangements  Alone    Available Help at Discharge  Family;Friend(s)    Type of [place] to enter    Entrance Stairs-Number of Steps  [place]  One level      Prior Function   Level of Independence  Independent with basic ADLs had family/friends to help with B UE tasks    Vocation  Retired    Leisure  gym, Paediatric nurse dancing      Cognition   Overall Cognitive Status  Within Functional Limits for tasks assessed      Observation/Other Assessments   Focus on Therapeutic Outcomes (FOTO)   Shoulder: 35 (65% limited, 38% predicted)      Sensation   Light Touch  Appears Intact      Coordination   Gross Motor Movements are Fluid and Coordinated  Yes      Posture/Postural Control   Posture/Postural Control  Postural limitations    Postural Limitations  Rounded Shoulders;Forward head;Increased thoracic kyphosis      ROM / Strength   AROM / PROM / Strength  AROM;PROM;Strength      AROM   AROM Assessment Site  Shoulder    Right/Left Shoulder  Left    Left Shoulder Flexion  153 Degrees    Left Shoulder ABduction  154 Degrees    Left Shoulder Internal Rotation  90 Degrees    Left Shoulder External Rotation  55 Degrees      PROM   PROM Assessment Site  Shoulder    Right/Left Shoulder  Right;Left    Right Shoulder Flexion  112 Degrees    Right Shoulder ABduction  60 Degrees    Right Shoulder Internal Rotation  70 Degrees in scapular plane    Right Shoulder External Rotation  16 Degrees in scapular plane    Left Shoulder Flexion  160 Degrees    Left Shoulder ABduction  172 Degrees    Left Shoulder Internal Rotation  74 Degrees    Left Shoulder External Rotation  38 Degrees      Strength   Strength Assessment Site  Shoulder    Right/Left Shoulder  Left    Left Shoulder Flexion  4+/5    Left Shoulder ABduction  4+/5    Left Shoulder Internal Rotation  4+/5    Left Shoulder External Rotation  4/5      Palpation   Palpation comment  mildly TTP in anterior shoulder over incision site  Objective measurements completed on examination: See above findings.              PT  Education - 12/22/17 1805    Education Details  prognosis, POC, HEP    Person(s) Educated  Patient    Methods  Explanation;Demonstration;Tactile cues;Verbal cues;Handout    Comprehension  Returned demonstration;Verbalized understanding       PT Short Term Goals - 12/22/17 1814      PT SHORT TERM GOAL #1   Title  Patient to be independent with initial HEP.    Time  4    Period  Weeks    Status  New    Target Date  01/19/18        PT Long Term Goals - 12/22/17 1814      PT LONG TERM GOAL #1   Title  Patient to be independent with advanced HEP.    Time  8    Period  Weeks    Status  New    Target Date  02/16/18      PT LONG TERM GOAL #2   Title  Patient to demonstrate [facility] R shoulder AROM/PROM.    Time  8    Period  Weeks    Status  New    Target Date  02/16/18      PT LONG TERM GOAL #3   Title  Patient to demonstrate >=4+/5 R shoulder strength.    Time  8    Period  Weeks    Status  New    Target Date  02/16/18      PT LONG TERM GOAL #4   Title  Patient to report tolerance of putting on bra with <=2/10 pain.    Time  8    Period  Weeks    Status  New    Target Date  02/16/18      PT LONG TERM GOAL #5   Title  Patient to demonstrate overhead reaching of R UE to retrieve item from overhead shelf.    Time  8    Period  Weeks    Status  New    Target Date  02/16/18             Plan - 12/22/17 1806    Clinical Impression Statement  Patient is a 76y/o F presenting to OPPT after R TSA on 11/10/17. Patient followed up with MD this week who released her from her sling. Patient reports avoiding reaching and lifting motions with R UE, however holding purse in R hand at beginning of session. Reminded patient of precautions and advised to prop R UE on pillow when in supine to avoid risk of dislocation. Patient reported understanding. Patient reporting difficulty with putting on bra, vacuuming, making bed, driving. Patient today with limited R shoulder PROM and  tenderness to anterior shoulder at incision site. Educated on and received HEP handout for gentle periscapular strengthening and pendulum exercises. Patient reported understanding. Would benefit from skilled PT services 2x/week for 8 weeks to address aforementioned impairments.     Clinical Presentation  Stable    Clinical Decision Making  Low    Rehab Potential  Good    PT Frequency  2x / week    PT Duration  8 weeks    PT Treatment/Interventions  ADLs/Self Care Home Management;Cryotherapy;Moist Heat;Therapeutic activities;Therapeutic exercise;Manual techniques;Patient/family education;Scar mobilization;Passive range of motion;Dry needling;Energy conservation;Splinting;Taping;Vasopneumatic Device    PT Next Visit Plan  reassess HEP    Consulted and Agree  with Plan of Care  Patient       Patient will benefit from skilled therapeutic intervention in order to improve the following deficits and impairments:  Decreased scar mobility, Decreased activity tolerance, Decreased strength, Pain, Impaired UE functional use, Improper body mechanics, Decreased range of motion, Impaired flexibility, Postural dysfunction  Visit Diagnosis: Acute pain of right shoulder  Stiffness of right shoulder, not elsewhere classified  Muscle weakness (generalized)  Other symptoms and signs involving the musculoskeletal system     Problem List Patient Active Problem List   Diagnosis Date Noted  . Acute respiratory failure ([place]) 11/10/2017  . Primary localized osteoarthrosis of right shoulder 11/10/2017  . Preoperative cardiovascular examination 10/16/2017  . PAT (paroxysmal atrial tachycardia) ([place]) 06/17/2013  . PAC (premature atrial contraction) 06/17/2013  . Essential hypertension 06/17/2013  . Hyperlipidemia 06/17/2013  . Right shoulder pain 01/20/2012    Janene Harvey, PT, DPT 12/22/17 6:19 PM   [facility] [address]  [address]  [place], [place], [address] Phone: [phone]   Fax:  [phone]  Name: LUPE HANDLEY MRN: [id number] Date of Birth: 1940/06/02

## 2017-12-30 ENCOUNTER — Ambulatory Visit: Payer: Medicare Other | Attending: Orthopedic Surgery

## 2017-12-30 DIAGNOSIS — R29898 Other symptoms and signs involving the musculoskeletal system: Secondary | ICD-10-CM | POA: Diagnosis not present

## 2017-12-30 DIAGNOSIS — M25511 Pain in right shoulder: Secondary | ICD-10-CM | POA: Diagnosis not present

## 2017-12-30 DIAGNOSIS — M6281 Muscle weakness (generalized): Secondary | ICD-10-CM

## 2017-12-30 DIAGNOSIS — M25611 Stiffness of right shoulder, not elsewhere classified: Secondary | ICD-10-CM

## 2017-12-30 NOTE — Therapy (Signed)
[facility] [address]  [place], [place], [address] Phone: [phone]   Fax:  [phone]  Physical Therapy Treatment  Patient Details  Name: Jasmine Swanson MRN: [id number] Date of Birth: 1941/03/06 Referring Provider: Marchia Bond, MD   Encounter Date: 12/30/2017  PT End of Session - 12/30/17 1318    Visit Number  2    Number of Visits  17    Date for PT Re-Evaluation  02/16/18    Authorization Type  [employer] Medicare    PT Start Time  1315    PT Stop Time  1410    PT Time Calculation (min)  55 min    Activity Tolerance  Patient tolerated treatment well    Behavior During Therapy  [facility] for tasks assessed/performed       Past Medical History:  Diagnosis Date  . Anxiety   . Arthritis   . Bundle branch block, left   . Complication of anesthesia    laryngeal spams 50 yrs ago  . Depression   . Dysphagia   . Essential hypertension 06/17/2013  . GERD (gastroesophageal reflux disease)   . Hyperlipidemia   . Kyphosis   . PAC (premature atrial contraction) 06/17/2013  . PAT (paroxysmal atrial tachycardia) ([place]) 06/17/2013  . Plantar fasciitis of right foot   . Preop cardiovascular exam 10/16/2017  . Primary localized osteoarthrosis of right shoulder 11/10/2017  . Right shoulder pain 01/20/2012    Past Surgical History:  Procedure Laterality Date  . ABDOMINAL HYSTERECTOMY    . bladder tac    . CARDIAC CATHETERIZATION    . TONSILLECTOMY    . TOTAL SHOULDER ARTHROPLASTY Right 11/10/2017   Procedure: TOTAL SHOULDER ARTHROPLASTY;  Surgeon: Marchia Bond, MD;  Location: [place];  Service: Orthopedics;  Laterality: Right;    There were no vitals filed for this visit.  Subjective Assessment - 12/30/17 1316    Subjective  Pt. doing well today.      Pertinent History  R shoulder pain, R plantar fasciitis, PAC and PAT, kyphosis, HLD, GERD, HTN, dysphagia, LBBB, cardiac cath    Diagnostic tests  11/11/17 R shoulder  xray: New right shoulder arthroplasty without postop complicating features    Patient Stated Goals  i want to get back to exercising and dancing     Currently in Pain?  No/denies    Multiple Pain Sites  No                       [facility] Adult PT Treatment/Exercise - 12/30/17 1338      Shoulder Exercises: Seated   Retraction  Both;15 reps;Strengthening 2 sets     Retraction Limitations  5" hold    Other Seated Exercises  shoulder shrugs x 15 reps       Shoulder Exercises: ROM/Strengthening   Pendulum  Circles CW, CCW, forward/back, R/L x 30 sec each  Good overall techniqeu with good tolerance       Modalities   Modalities  Vasopneumatic      Vasopneumatic   Number Minutes Vasopneumatic   10 minutes    Vasopnuematic Location   Shoulder    Vasopneumatic Pressure  Low    Vasopneumatic Temperature   coldest temp       Manual Therapy   Manual Therapy  Passive ROM;Soft tissue mobilization    Manual therapy comments  Hook[place] with pillow propped under R arm     Soft tissue mobilization  STM to R UT, mid lats, R teres group in area of tenderness     Passive ROM  R shoulder PROM pain free               PT Short Term Goals - 12/30/17 1319      PT SHORT TERM GOAL #1   Title  Patient to be independent with initial HEP.    Time  4    Period  Weeks    Status  On-going        PT Long Term Goals - 12/30/17 1319      PT LONG TERM GOAL #1   Title  Patient to be independent with advanced HEP.    Time  8    Period  Weeks    Status  On-going      PT LONG TERM GOAL #2   Title  Patient to demonstrate [facility] R shoulder AROM/PROM.    Time  8    Period  Weeks    Status  On-going      PT LONG TERM GOAL #3   Title  Patient to demonstrate >=4+/5 R shoulder strength.    Time  8    Period  Weeks    Status  On-going      PT LONG TERM GOAL #4   Title  Patient to report tolerance of putting on bra with <=2/10 pain.    Time  8    Period  Weeks    Status  On-going       PT LONG TERM GOAL #5   Title  Patient to demonstrate overhead reaching of R UE to retrieve item from overhead shelf.    Time  8    Period  Weeks    Status  On-going            Plan - 12/30/17 1319    Clinical Impression Statement  Pt. doing well today noting no pain.  Tolerated all PROM and gentle scapular strengthening well today per protocol.  Did present with R UT and R posterior/inferior shoulder tenderness with palpable TP's thus addressed this with manual STM/TPR with good relief noted.  Ended session with ice/compression to R shoulder to decrease post-session pain swelling.  Will continue to progress toward goals.      PT Treatment/Interventions  ADLs/Self Care Home Management;Cryotherapy;Moist Heat;Therapeutic activities;Therapeutic exercise;Manual techniques;Patient/family education;Scar mobilization;Passive range of motion;Dry needling;Energy conservation;Splinting;Taping;Vasopneumatic Device    Consulted and Agree with Plan of Care  Patient       Patient will benefit from skilled therapeutic intervention in order to improve the following deficits and impairments:  Decreased scar mobility, Decreased activity tolerance, Decreased strength, Pain, Impaired UE functional use, Improper body mechanics, Decreased range of motion, Impaired flexibility, Postural dysfunction  Visit Diagnosis: Acute pain of right shoulder  Stiffness of right shoulder, not elsewhere classified  Muscle weakness (generalized)  Other symptoms and signs involving the musculoskeletal system     Problem List Patient Active Problem List   Diagnosis Date Noted  . Acute respiratory failure ([place]) 11/10/2017  . Primary localized osteoarthrosis of right shoulder 11/10/2017  . Preoperative cardiovascular examination 10/16/2017  . PAT (paroxysmal atrial tachycardia) ([place]) 06/17/2013  . PAC (premature atrial contraction) 06/17/2013  . Essential hypertension 06/17/2013  . Hyperlipidemia 06/17/2013  .  Right shoulder pain 01/20/2012    Bess Harvest, PTA 12/30/17 6:22 PM   [facility] [address]  [place], [place], [address]  Phone: [phone]   Fax:  [phone]  Name: Jasmine Swanson MRN: [id number] Date of Birth: 1941/01/20

## 2018-01-01 ENCOUNTER — Ambulatory Visit: Payer: Medicare Other | Admitting: Physical Therapy

## 2018-01-01 ENCOUNTER — Encounter: Payer: Self-pay | Admitting: Physical Therapy

## 2018-01-01 DIAGNOSIS — M25561 Pain in right knee: Secondary | ICD-10-CM | POA: Insufficient documentation

## 2018-01-01 DIAGNOSIS — R29898 Other symptoms and signs involving the musculoskeletal system: Secondary | ICD-10-CM

## 2018-01-01 DIAGNOSIS — M25511 Pain in right shoulder: Secondary | ICD-10-CM | POA: Diagnosis not present

## 2018-01-01 DIAGNOSIS — M25562 Pain in left knee: Secondary | ICD-10-CM | POA: Diagnosis not present

## 2018-01-01 DIAGNOSIS — M25611 Stiffness of right shoulder, not elsewhere classified: Secondary | ICD-10-CM | POA: Diagnosis not present

## 2018-01-01 DIAGNOSIS — M6281 Muscle weakness (generalized): Secondary | ICD-10-CM | POA: Diagnosis not present

## 2018-01-01 HISTORY — DX: Pain in right knee: M25.561

## 2018-01-01 NOTE — Therapy (Signed)
[facility] [address]  [place], [place], [address] Phone: [phone]   Fax:  [phone]  Physical Therapy Treatment  Patient Details  Name: Jasmine Swanson MRN: [id number] Date of Birth: [birth year] Referring Provider: Marchia Bond, MD   Encounter Date: 01/01/2018  PT End of Session - 01/01/18 1157    Visit Number  3    Number of Visits  17    Date for PT Re-Evaluation  02/16/18    Authorization Type  [employer]    PT Start Time  1016    PT Stop Time  1103    PT Time Calculation (min)  47 min    Activity Tolerance  Patient tolerated treatment well    Behavior During Therapy  [facility] for tasks assessed/performed       Past Medical History:  Diagnosis Date  . Anxiety   . Arthritis   . Bundle branch block, left   . Complication of anesthesia    laryngeal spams 50 yrs ago  . Depression   . Dysphagia   . Essential hypertension 06/17/2013  . GERD (gastroesophageal reflux disease)   . Hyperlipidemia   . Kyphosis   . PAC (premature atrial contraction) 06/17/2013  . PAT (paroxysmal atrial tachycardia) ([place]) 06/17/2013  . Plantar fasciitis of right foot   . Preop cardiovascular exam 10/16/2017  . Primary localized osteoarthrosis of right shoulder 11/10/2017  . Right shoulder pain 01/20/2012    Past Surgical History:  Procedure Laterality Date  . ABDOMINAL HYSTERECTOMY    . bladder tac    . CARDIAC CATHETERIZATION    . TONSILLECTOMY    . TOTAL SHOULDER ARTHROPLASTY Right 11/10/2017   Procedure: TOTAL SHOULDER ARTHROPLASTY;  Surgeon: Marchia Bond, MD;  Location: [place];  Service: Orthopedics;  Laterality: Right;    There were no vitals filed for this visit.  Subjective Assessment - 01/01/18 1017    Subjective  Things are going good and doing HEP at home. Was sore after session but dissipated the next day.     Pertinent History  R shoulder pain, R plantar fasciitis, PAC and PAT, kyphosis, HLD, GERD, HTN,  dysphagia, LBBB, cardiac cath    Diagnostic tests  11/11/17 R shoulder xray: New right shoulder arthroplasty without postop complicating features    Patient Stated Goals  i want to get back to exercising and dancing     Currently in Pain?  No/denies                       [facility] Adult PT Treatment/Exercise - 01/01/18 0001      Exercises   Exercises  Shoulder      Shoulder Exercises: Supine   External Rotation  AAROM;Right;Left;10 reps;Limitations    External Rotation Limitations  IR/ER in scapular plane AAROM with wand to tolerance   manually keeping R elbow down in scapular plane   Flexion  AAROM;Right;Left;10 reps;Limitations    Flexion Limitations  scaption AAROM with wand to tolerance    Other Supine Exercises  R UE horizonal abduction AAROM in limited range to tolerance with wand; 10x   heavy VCs to keep in limited range     Shoulder Exercises: ROM/Strengthening   Pendulum  Circles CW, CCW, forward/back, R/L x 30 sec each       Shoulder Exercises: Isometric Strengthening   Flexion  5X5"    Flexion Limitations  2 finger isometric against wall     External  Rotation  5X5"    Internal Rotation  5X5"      Vasopneumatic   Number Minutes Vasopneumatic   10 minutes    Vasopnuematic Location   Shoulder    Vasopneumatic Pressure  Low    Vasopneumatic Temperature   coldest temp       Manual Therapy   Manual Therapy  Passive ROM;Soft tissue mobilization    Soft tissue mobilization  STM to R UT, LS, teres minor- TTP and soft tissue restriction    Passive ROM  R shoulder PROM in scapular plane pain free; avoiding ABD             PT Education - 01/01/18 1157    Education Details  update to HEP    Person(s) Educated  Patient    Methods  Explanation;Demonstration;Tactile cues;Verbal cues;Handout    Comprehension  Returned demonstration;Verbalized understanding       PT Short Term Goals - 01/01/18 1205      PT SHORT TERM GOAL #1   Title  Patient to be  independent with initial HEP.    Time  4    Period  Weeks    Status  Achieved        PT Long Term Goals - 12/30/17 1319      PT LONG TERM GOAL #1   Title  Patient to be independent with advanced HEP.    Time  8    Period  Weeks    Status  On-going      PT LONG TERM GOAL #2   Title  Patient to demonstrate [facility] R shoulder AROM/PROM.    Time  8    Period  Weeks    Status  On-going      PT LONG TERM GOAL #3   Title  Patient to demonstrate >=4+/5 R shoulder strength.    Time  8    Period  Weeks    Status  On-going      PT LONG TERM GOAL #4   Title  Patient to report tolerance of putting on bra with <=2/10 pain.    Time  8    Period  Weeks    Status  On-going      PT LONG TERM GOAL #5   Title  Patient to demonstrate overhead reaching of R UE to retrieve item from overhead shelf.    Time  8    Period  Weeks    Status  On-going            Plan - 01/01/18 1157    Clinical Impression Statement  Patient arrived to session with no new complaints. Not using handbag this session, reports she has been avoiding using it on R UE. Patient with good performance of pendulum for warm up. Tolerated STM to R UT, LS, teres minor- TTP and soft tissue restriction in these areas. Patient with report of good relief from [facility]. Good tolerance R shoulder PROM in scapular plane, staying within pain free range and avoiding ABD. Heavily educated and practiced B UE AAROM flexion and IR/ER with wand in scapular plane and with UE propped on pillow. Also worked on gentle submax isometrics. Added these exercises to HEP-Patient reported understanding. Received [place] to R shoulder at end of session. No c/o pain at end of session.     PT Treatment/Interventions  ADLs/Self Care Home Management;Cryotherapy;Moist Heat;Therapeutic activities;Therapeutic exercise;Manual techniques;Patient/family education;Scar mobilization;Passive range of motion;Dry needling;Energy conservation;Splinting;Taping;Vasopneumatic  Device    PT Next Visit Plan  reassess  AAROM and isometrics    Consulted and Agree with Plan of Care  Patient       Patient will benefit from skilled therapeutic intervention in order to improve the following deficits and impairments:  Decreased scar mobility, Decreased activity tolerance, Decreased strength, Pain, Impaired UE functional use, Improper body mechanics, Decreased range of motion, Impaired flexibility, Postural dysfunction  Visit Diagnosis: Acute pain of right shoulder  Stiffness of right shoulder, not elsewhere classified  Muscle weakness (generalized)  Other symptoms and signs involving the musculoskeletal system     Problem List Patient Active Problem List   Diagnosis Date Noted  . Acute respiratory failure ([place]) 11/10/2017  . Primary localized osteoarthrosis of right shoulder 11/10/2017  . Preoperative cardiovascular examination 10/16/2017  . PAT (paroxysmal atrial tachycardia) ([place]) 06/17/2013  . PAC (premature atrial contraction) 06/17/2013  . Essential hypertension 06/17/2013  . Hyperlipidemia 06/17/2013  . Right shoulder pain 01/20/2012    Janene Harvey, PT, DPT 01/01/18 12:06 PM  [facility] [address]  [address] [place], [place], [address] Phone: [phone]   Fax:  [phone]  Name: ARRIANNA CATALA MRN: [id number] Date of Birth: 1940/08/12

## 2018-01-04 ENCOUNTER — Ambulatory Visit: Payer: Medicare Other

## 2018-01-04 DIAGNOSIS — M25611 Stiffness of right shoulder, not elsewhere classified: Secondary | ICD-10-CM

## 2018-01-04 DIAGNOSIS — M25511 Pain in right shoulder: Secondary | ICD-10-CM

## 2018-01-04 DIAGNOSIS — M6281 Muscle weakness (generalized): Secondary | ICD-10-CM

## 2018-01-04 DIAGNOSIS — R29898 Other symptoms and signs involving the musculoskeletal system: Secondary | ICD-10-CM

## 2018-01-04 NOTE — Therapy (Signed)
[facility] [address]  [place], [place], [address] Phone: [phone]   Fax:  [phone]  Physical Therapy Treatment  Patient Details  Name: Jasmine Swanson MRN: [id number] Date of Birth: 1940/12/04 Referring Provider: Marchia Bond, MD   Encounter Date: 01/04/2018  PT End of Session - 01/04/18 1323    Visit Number  4    Number of Visits  17    Date for PT Re-Evaluation  02/16/18    Authorization Type  [employer]    PT Start Time  1316    PT Stop Time  1415    PT Time Calculation (min)  59 min    Activity Tolerance  Patient tolerated treatment well    Behavior During Therapy  [facility] for tasks assessed/performed       Past Medical History:  Diagnosis Date  . Anxiety   . Arthritis   . Bundle branch block, left   . Complication of anesthesia    laryngeal spams 50 yrs ago  . Depression   . Dysphagia   . Essential hypertension 06/17/2013  . GERD (gastroesophageal reflux disease)   . Hyperlipidemia   . Kyphosis   . PAC (premature atrial contraction) 06/17/2013  . PAT (paroxysmal atrial tachycardia) ([place]) 06/17/2013  . Plantar fasciitis of right foot   . Preop cardiovascular exam 10/16/2017  . Primary localized osteoarthrosis of right shoulder 11/10/2017  . Right shoulder pain 01/20/2012    Past Surgical History:  Procedure Laterality Date  . ABDOMINAL HYSTERECTOMY    . bladder tac    . CARDIAC CATHETERIZATION    . TONSILLECTOMY    . 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  Subjective Assessment - 01/04/18 1510    Subjective  Pt. doing well today noting most HEP activities going well with exception of HEP isometric ER which she wishes to review.      Pertinent History  R shoulder pain, R plantar fasciitis, PAC and PAT,  kyphosis, HLD, GERD, HTN, dysphagia, LBBB, cardiac cath    Diagnostic tests  11/11/17 R shoulder xray: New right shoulder arthroplasty without postop complicating features    Patient Stated Goals  i want to get back to exercising and dancing     Currently in Pain?  No/denies    Multiple Pain Sites  No                       [facility] Adult PT Treatment/Exercise - 01/04/18 1322      Shoulder Exercises: Supine   External Rotation  Right;AAROM;15 reps;Limitations    External Rotation Limitations  elbow in scapular plane     Flexion  Right;AAROM;15 reps;Limitations    Flexion Limitations  scaption AAROM with wand to tolerance      Shoulder Exercises: Pulleys   Flexion  --    Flexion Limitations  --    Scaption  3 minutes    Scaption Limitations  cues to avoid painful range       Shoulder Exercises: Isometric Strengthening   Flexion  5X10"    External Rotation  5X10"   Cues required for proper positioning    External Rotation Limitations  Cues required for proper positioning     Internal Rotation  5X10"  Vasopneumatic   Number Minutes Vasopneumatic   15 minutes    Vasopnuematic Location   Shoulder    Vasopneumatic Pressure  Low    Vasopneumatic Temperature   coldest temp       Manual Therapy   Manual Therapy  Passive ROM;Soft tissue mobilization    Soft tissue mobilization  STM to R UT, LS - TTP - however pt. noting improvement in tenderness since last visit    Passive ROM  R shoulder PROM in scapular plane pain free; avoiding ABD   to tolerance               PT Short Term Goals - 01/01/18 1205      PT SHORT TERM GOAL #1   Title  Patient to be independent with initial HEP.    Time  4    Period  Weeks    Status  Achieved        PT Long Term Goals - 12/30/17 1319      PT LONG TERM GOAL #1   Title  Patient to be independent with advanced HEP.    Time  8    Period  Weeks    Status  On-going      PT LONG TERM GOAL #2   Title  Patient to  demonstrate [facility] R shoulder AROM/PROM.    Time  8    Period  Weeks    Status  On-going      PT LONG TERM GOAL #3   Title  Patient to demonstrate >=4+/5 R shoulder strength.    Time  8    Period  Weeks    Status  On-going      PT LONG TERM GOAL #4   Title  Patient to report tolerance of putting on bra with <=2/10 pain.    Time  8    Period  Weeks    Status  On-going      PT LONG TERM GOAL #5   Title  Patient to demonstrate overhead reaching of R UE to retrieve item from overhead shelf.    Time  8    Period  Weeks    Status  On-going            Plan - 01/04/18 1324    Clinical Impression Statement  Pt. requesting to review ER isometric activity today due to some confusion on positioning however able to demo proper technique following instruction.  Feels she is tolerating all other HEP activities well at this point.  Tolerated mild progression of isometric activities well today without issue.  Added pulleys for improved ROM with cueing to avoid painful arc with pt. tolerating well.  Ended session with ice/compression to shoulder to decrease post-exercise swelling and pain.      PT Treatment/Interventions  ADLs/Self Care Home Management;Cryotherapy;Moist Heat;Therapeutic activities;Therapeutic exercise;Manual techniques;Patient/family education;Scar mobilization;Passive range of motion;Dry needling;Energy conservation;Splinting;Taping;Vasopneumatic Device    Consulted and Agree with Plan of Care  Patient       Patient will benefit from skilled therapeutic intervention in order to improve the following deficits and impairments:  Decreased scar mobility, Decreased activity tolerance, Decreased strength, Pain, Impaired UE functional use, Improper body mechanics, Decreased range of motion, Impaired flexibility, Postural dysfunction  Visit Diagnosis: Acute pain of right shoulder  Stiffness of right shoulder, not elsewhere classified  Muscle weakness (generalized)  Other symptoms  and signs involving the musculoskeletal system     Problem List Patient Active Problem List   Diagnosis Date  Noted  . Acute respiratory failure ([place]) 11/10/2017  . Primary localized osteoarthrosis of right shoulder 11/10/2017  . Preoperative cardiovascular examination 10/16/2017  . PAT (paroxysmal atrial tachycardia) ([place]) 06/17/2013  . PAC (premature atrial contraction) 06/17/2013  . Essential hypertension 06/17/2013  . Hyperlipidemia 06/17/2013  . Right shoulder pain 01/20/2012    Bess Harvest, PTA 01/04/18 5:37 PM   [facility] [address]  [place], [place], [address] Phone: [phone]   Fax:  [phone]  Name: Jasmine Swanson MRN: [id number] Date of Birth: [birth year]

## 2018-01-07 ENCOUNTER — Ambulatory Visit: Payer: Medicare Other | Admitting: Physical Therapy

## 2018-01-07 ENCOUNTER — Encounter: Payer: Self-pay | Admitting: Physical Therapy

## 2018-01-07 DIAGNOSIS — M25611 Stiffness of right shoulder, not elsewhere classified: Secondary | ICD-10-CM | POA: Diagnosis not present

## 2018-01-07 DIAGNOSIS — R29898 Other symptoms and signs involving the musculoskeletal system: Secondary | ICD-10-CM

## 2018-01-07 DIAGNOSIS — M25511 Pain in right shoulder: Secondary | ICD-10-CM

## 2018-01-07 DIAGNOSIS — M6281 Muscle weakness (generalized): Secondary | ICD-10-CM

## 2018-01-07 NOTE — Therapy (Signed)
[facility] [address]  [place], [place], [address] Phone: [phone]   Fax:  [phone]  Physical Therapy Treatment  Patient Details  Name: Jasmine Swanson MRN: [id number] Date of Birth: 04-08-1941 Referring Provider: Marchia Bond, MD   Encounter Date: 01/07/2018  PT End of Session - 01/07/18 1351    Visit Number  5    Number of Visits  17    Date for PT Re-Evaluation  02/16/18    Authorization Type  [employer]    PT Start Time  1306    PT Stop Time  1353    PT Time Calculation (min)  47 min    Activity Tolerance  Patient tolerated treatment well    Behavior During Therapy  [facility] for tasks assessed/performed       Past Medical History:  Diagnosis Date  . Anxiety   . Arthritis   . Bundle branch block, left   . Complication of anesthesia    laryngeal spams 50 yrs ago  . Depression   . Dysphagia   . Essential hypertension 06/17/2013  . GERD (gastroesophageal reflux disease)   . Hyperlipidemia   . Kyphosis   . PAC (premature atrial contraction) 06/17/2013  . PAT (paroxysmal atrial tachycardia) ([place]) 06/17/2013  . Plantar fasciitis of right foot   . Preop cardiovascular exam 10/16/2017  . Primary localized osteoarthrosis of right shoulder 11/10/2017  . Right shoulder pain 01/20/2012    Past Surgical History:  Procedure Laterality Date  . ABDOMINAL HYSTERECTOMY    . bladder tac    . CARDIAC CATHETERIZATION    . TONSILLECTOMY    . 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  Subjective Assessment - 01/07/18 1305    Subjective  Reports nothing new today.    Pertinent History  R shoulder pain, R plantar fasciitis, PAC and PAT, kyphosis, HLD, GERD, HTN, dysphagia, LBBB, cardiac cath    Diagnostic tests  11/11/17 R shoulder  xray: New right shoulder arthroplasty without postop complicating features    Patient Stated Goals  i want to get back to exercising and dancing     Currently in Pain?  No/denies                       [facility] Adult PT Treatment/Exercise - 01/07/18 0001      Exercises   Exercises  Shoulder;Elbow      Elbow Exercises   Elbow Flexion  Strengthening;Right;10 reps;Seated;Bar weights/barbell;Limitations    Bar Weights/Barbell (Elbow Flexion)  1 lb    Elbow Flexion Limitations  dowel at elbow to avoid shoulder movement    Forearm Pronation  Strengthening;Right;10 reps;Seated;Bar weights/barbell;Limitations    Bar Weights/Barbell (Forearm Pronation)  1 lb    Forearm Pronation Limitations  pronation/supination; dowel at elbow to avoid shoulder movement      Shoulder Exercises: Supine   External Rotation  Right;AAROM;15 reps;Limitations   VC/TCs to maintain elbow flexion and for form   External Rotation Limitations  ER/IR; elbow in scapular plane, propped on pillow    Flexion  Right;AAROM;Limitations;10 reps    Flexion Limitations  scaption AAROM with wand to tolerance; propped on pillow    Other Supine Exercises  R shoulder flexion, ER, IR AROM to tolerance in scapular plane; 10x each  manual assistance to maintain R elbow bent and in scapular p     Shoulder Exercises: Seated   Other Seated Exercises  scapular retraction 15x3"      Shoulder Exercises: Pulleys   Scaption  3 minutes    Scaption Limitations  cues to avoid painful range       Vasopneumatic   Number Minutes Vasopneumatic   10 minutes    Vasopnuematic Location   Shoulder    Vasopneumatic Pressure  Low    Vasopneumatic Temperature   coldest temp       Manual Therapy   Manual Therapy  Passive ROM;Soft tissue mobilization    Soft tissue mobilization  R UT, deltoid, pec, bicep- TTP and soft tissue restriction in UT and bicep muscle belly    Passive ROM  R shoulder PROM in scapular plane pain free; avoiding ABD;  prolonged holds at tolerable range             PT Education - 01/07/18 1351    Education Details  update to HEP    Person(s) Educated  Patient    Methods  Explanation;Demonstration;Tactile cues;Verbal cues;Handout    Comprehension  Verbalized understanding;Returned demonstration       PT Short Term Goals - 01/01/18 1205      PT SHORT TERM GOAL #1   Title  Patient to be independent with initial HEP.    Time  4    Period  Weeks    Status  Achieved        PT Long Term Goals - 12/30/17 1319      PT LONG TERM GOAL #1   Title  Patient to be independent with advanced HEP.    Time  8    Period  Weeks    Status  On-going      PT LONG TERM GOAL #2   Title  Patient to demonstrate [facility] R shoulder AROM/PROM.    Time  8    Period  Weeks    Status  On-going      PT LONG TERM GOAL #3   Title  Patient to demonstrate >=4+/5 R shoulder strength.    Time  8    Period  Weeks    Status  On-going      PT LONG TERM GOAL #4   Title  Patient to report tolerance of putting on bra with <=2/10 pain.    Time  8    Period  Weeks    Status  On-going      PT LONG TERM GOAL #5   Title  Patient to demonstrate overhead reaching of R UE to retrieve item from overhead shelf.    Time  8    Period  Weeks    Status  On-going            Plan - 01/07/18 1353    Clinical Impression Statement  Patient arrived to session with no new complaints. Good tolerance of STM and PROM to R shoulder this date- soft tissue restriction and tenderness noted in UT and biceps. Patient with good carryover of AAROM exercises this date. Also with good tolerance of gentle R shoulder AROM into flexion, IR, and ER in scapular plane. Progressed elbow AROM with 1# weight with specific instruction to avoid shoulder movement. Patient with good carryover. Added these exercises to HEP and administered handout. Patient requested [place] to R shoulder at end of session- normal integumentary response noted.     PT  Treatment/Interventions  ADLs/Self  Care Home Management;Cryotherapy;Moist Heat;Therapeutic activities;Therapeutic exercise;Manual techniques;Patient/family education;Scar mobilization;Passive range of motion;Dry needling;Energy conservation;Splinting;Taping;Vasopneumatic Device    Consulted and Agree with Plan of Care  Patient       Patient will benefit from skilled therapeutic intervention in order to improve the following deficits and impairments:  Decreased scar mobility, Decreased activity tolerance, Decreased strength, Pain, Impaired UE functional use, Improper body mechanics, Decreased range of motion, Impaired flexibility, Postural dysfunction  Visit Diagnosis: Acute pain of right shoulder  Stiffness of right shoulder, not elsewhere classified  Muscle weakness (generalized)  Other symptoms and signs involving the musculoskeletal system     Problem List Patient Active Problem List   Diagnosis Date Noted  . Acute respiratory failure ([place]) 11/10/2017  . Primary localized osteoarthrosis of right shoulder 11/10/2017  . Preoperative cardiovascular examination 10/16/2017  . PAT (paroxysmal atrial tachycardia) ([place]) 06/17/2013  . PAC (premature atrial contraction) 06/17/2013  . Essential hypertension 06/17/2013  . Hyperlipidemia 06/17/2013  . Right shoulder pain 01/20/2012    Janene Harvey, PT, DPT 01/07/18 1:55 PM   [facility] [address]  [place], [place], [address] Phone: [phone]   Fax:  [phone]  Name: ILISSA ROSNER MRN: [id number] Date of Birth: [birth year]

## 2018-01-11 ENCOUNTER — Ambulatory Visit: Payer: Medicare Other

## 2018-01-11 DIAGNOSIS — M25611 Stiffness of right shoulder, not elsewhere classified: Secondary | ICD-10-CM

## 2018-01-11 DIAGNOSIS — M25511 Pain in right shoulder: Secondary | ICD-10-CM

## 2018-01-11 DIAGNOSIS — M6281 Muscle weakness (generalized): Secondary | ICD-10-CM

## 2018-01-11 DIAGNOSIS — R29898 Other symptoms and signs involving the musculoskeletal system: Secondary | ICD-10-CM

## 2018-01-11 NOTE — Therapy (Addendum)
[facility] [address]  [place], [place], [address] Phone: [phone]   Fax:  [phone]  Physical Therapy Treatment  Patient Details  Name: Jasmine Swanson MRN: [id number] Date of Birth: 1941/01/20 Referring Provider: Marchia Bond, MD   Encounter Date: 01/11/2018  PT End of Session - 01/11/18 1318    Visit Number  6    Number of Visits  17    Date for PT Re-Evaluation  02/16/18    Authorization Type  [employer]    PT Start Time  1315    PT Stop Time  1400    PT Time Calculation (min)  45 min    Activity Tolerance  Patient tolerated treatment well    Behavior During Therapy  [facility] for tasks assessed/performed       Past Medical History:  Diagnosis Date  . Anxiety   . Arthritis   . Bundle branch block, left   . Complication of anesthesia    laryngeal spams 50 yrs ago  . Depression   . Dysphagia   . Essential hypertension 06/17/2013  . GERD (gastroesophageal reflux disease)   . Hyperlipidemia   . Kyphosis   . PAC (premature atrial contraction) 06/17/2013  . PAT (paroxysmal atrial tachycardia) ([place]) 06/17/2013  . Plantar fasciitis of right foot   . Preop cardiovascular exam 10/16/2017  . Primary localized osteoarthrosis of right shoulder 11/10/2017  . Right shoulder pain 01/20/2012    Past Surgical History:  Procedure Laterality Date  . ABDOMINAL HYSTERECTOMY    . bladder tac    . CARDIAC CATHETERIZATION    . TONSILLECTOMY    . 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  Subjective Assessment - 01/11/18 1406    Subjective  Pt. doing well today and denies recent pain.     Pertinent History  R shoulder pain, R plantar fasciitis, PAC and PAT, kyphosis, HLD, GERD, HTN, dysphagia, LBBB, cardiac cath    Diagnostic tests   11/11/17 R shoulder xray: New right shoulder arthroplasty without postop complicating features    Patient Stated Goals  i want to get back to exercising and dancing     Currently in Pain?  No/denies    Multiple Pain Sites  No                       [facility] Adult PT Treatment/Exercise - 01/11/18 1321      Shoulder Exercises: Supine   External Rotation  Right;AAROM;15 reps;Limitations    External Rotation Limitations  ER/IR; elbow in scapular plane, propped on pillow    Flexion  Right;AAROM;Limitations;10 reps    Other Supine Exercises  R shoulder flexion, ER, IR AROM to tolerance in scapular plane; 15x each      Shoulder Exercises: Seated   Row  10 reps;Theraband;Both;Strengthening   good tolerance    Theraband Level (Shoulder Row)  Level 1 (Yellow)    Row Limitations  tactile cueing to stop elbow short of neutral position       Shoulder Exercises: Standing   Retraction  15 reps    Retraction Limitations  5" hold       Shoulder Exercises: Pulleys   Scaption  3 minutes    Scaption Limitations  cues to avoid painful range  Manual Therapy   Manual Therapy  Passive ROM;Soft tissue mobilization    Passive ROM  R shoulder PROM in scapular plane pain free; avoiding ABD; prolonged holds at tolerable range               PT Short Term Goals - 01/01/18 1205      PT SHORT TERM GOAL #1   Title  Patient to be independent with initial HEP.    Time  4    Period  Weeks    Status  Achieved        PT Long Term Goals - 12/30/17 1319      PT LONG TERM GOAL #1   Title  Patient to be independent with advanced HEP.    Time  8    Period  Weeks    Status  On-going      PT LONG TERM GOAL #2   Title  Patient to demonstrate [facility] R shoulder AROM/PROM.    Time  8    Period  Weeks    Status  On-going      PT LONG TERM GOAL #3   Title  Patient to demonstrate >=4+/5 R shoulder strength.    Time  8    Period  Weeks    Status  On-going      PT LONG TERM GOAL #4    Title  Patient to report tolerance of putting on bra with <=2/10 pain.    Time  8    Period  Weeks    Status  On-going      PT LONG TERM GOAL #5   Title  Patient to demonstrate overhead reaching of R UE to retrieve item from overhead shelf.    Time  8    Period  Weeks    Status  On-going            Plan - 01/11/18 1408    Clinical Impression Statement  Pt. doing well today and denies recent pain.  Feels her motion is improving.  Tolerated mild progression of AAROM activities well today per protocol.  PROM appears to be improving.  Ended visit with pt. pain free and declining ice.  Will continue to progress per protocol.      PT Treatment/Interventions  ADLs/Self Care Home Management;Cryotherapy;Moist Heat;Therapeutic activities;Therapeutic exercise;Manual techniques;Patient/family education;Scar mobilization;Passive range of motion;Dry needling;Energy conservation;Splinting;Taping;Vasopneumatic Device    Consulted and Agree with Plan of Care  Patient       Patient will benefit from skilled therapeutic intervention in order to improve the following deficits and impairments:  Decreased scar mobility, Decreased activity tolerance, Decreased strength, Pain, Impaired UE functional use, Improper body mechanics, Decreased range of motion, Impaired flexibility, Postural dysfunction  Visit Diagnosis: Acute pain of right shoulder  Stiffness of right shoulder, not elsewhere classified  Muscle weakness (generalized)  Other symptoms and signs involving the musculoskeletal system     Problem List Patient Active Problem List   Diagnosis Date Noted  . Acute respiratory failure ([place]) 11/10/2017  . Primary localized osteoarthrosis of right shoulder 11/10/2017  . Preoperative cardiovascular examination 10/16/2017  . PAT (paroxysmal atrial tachycardia) ([place]) 06/17/2013  . PAC (premature atrial contraction) 06/17/2013  . Essential hypertension 06/17/2013  . Hyperlipidemia 06/17/2013  .  Right shoulder pain 01/20/2012    Bess Harvest, PTA 01/11/2018, 2:17 PM  [facility] [address]  [place], [place], [address] Phone: [phone]   Fax:  [phone]  Name:  Jasmine Swanson MRN: [id number] Date of Birth: 22-Jul-1940

## 2018-01-14 ENCOUNTER — Ambulatory Visit: Payer: Medicare Other

## 2018-01-14 DIAGNOSIS — M25611 Stiffness of right shoulder, not elsewhere classified: Secondary | ICD-10-CM

## 2018-01-14 DIAGNOSIS — M25511 Pain in right shoulder: Secondary | ICD-10-CM | POA: Diagnosis not present

## 2018-01-14 DIAGNOSIS — R29898 Other symptoms and signs involving the musculoskeletal system: Secondary | ICD-10-CM

## 2018-01-14 DIAGNOSIS — M6281 Muscle weakness (generalized): Secondary | ICD-10-CM

## 2018-01-14 NOTE — Therapy (Signed)
[facility] [address]  [place], [place], [address] Phone: [phone]   Fax:  [phone]  Physical Therapy Treatment  Patient Details  Name: Jasmine Swanson MRN: [id number] Date of Birth: 11-08-1940 Referring Provider: Marchia Bond, MD   Encounter Date: 01/14/2018  PT End of Session - 01/14/18 1322    Visit Number  7    Number of Visits  17    Date for PT Re-Evaluation  02/16/18    Authorization Type  [employer]    PT Start Time  1316    PT Stop Time  1359    PT Time Calculation (min)  43 min    Activity Tolerance  Patient tolerated treatment well    Behavior During Therapy  [facility] for tasks assessed/performed       Past Medical History:  Diagnosis Date  . Anxiety   . Arthritis   . Bundle branch block, left   . Complication of anesthesia    laryngeal spams 50 yrs ago  . Depression   . Dysphagia   . Essential hypertension 06/17/2013  . GERD (gastroesophageal reflux disease)   . Hyperlipidemia   . Kyphosis   . PAC (premature atrial contraction) 06/17/2013  . PAT (paroxysmal atrial tachycardia) ([place]) 06/17/2013  . Plantar fasciitis of right foot   . Preop cardiovascular exam 10/16/2017  . Primary localized osteoarthrosis of right shoulder 11/10/2017  . Right shoulder pain 01/20/2012    Past Surgical History:  Procedure Laterality Date  . ABDOMINAL HYSTERECTOMY    . bladder tac    . CARDIAC CATHETERIZATION    . TONSILLECTOMY    . 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  Subjective Assessment - 01/14/18 1322    Subjective  Pt. noting no signficant pain over this past week.      Pertinent History  R shoulder pain, R plantar fasciitis, PAC and PAT, kyphosis, HLD, GERD, HTN, dysphagia, LBBB, cardiac cath    Diagnostic  tests  11/11/17 R shoulder xray: New right shoulder arthroplasty without postop complicating features    Patient Stated Goals  i want to get back to exercising and dancing     Currently in Pain?  No/denies    Multiple Pain Sites  No                       [facility] Adult PT Treatment/Exercise - 01/14/18 1403      Shoulder Exercises: Supine   External Rotation  Right;AAROM;15 reps;Limitations    External Rotation Limitations  wand; ER/IR; elbow in scapular plane, propped on pillow    Flexion  Right;AAROM;Limitations;15 reps    Flexion Limitations  wand; scaption AAROM with wand to tolerance; propped on pillow   Cues required for some increased pressure into stretch      Shoulder Exercises: Seated   Flexion  Right;5 reps    Flexion Limitations  table slide     Abduction  Right;5 reps    ABduction Limitations  scaption table slide       Shoulder Exercises: Pulleys   Flexion  3 minutes    Flexion Limitations  well tolerated     Scaption  3 minutes    Scaption Limitations  cues to avoid painful range  Shoulder Exercises: ROM/Strengthening   Wall Wash  R flexion wall wash with L UE assistance at elbow x 10 reps    well tolerated      Manual Therapy   Manual Therapy  Passive ROM;Soft tissue mobilization;Myofascial release    Manual therapy comments  supine     Soft tissue mobilization  R lateral deltoid in area of tenderness     Myofascial Release  TPR to R lateral deltoid    Passive ROM  R shoulder PROM in scapular plane pain free; avoiding ABD; prolonged holds at tolerable range               PT Short Term Goals - 01/01/18 1205      PT SHORT TERM GOAL #1   Title  Patient to be independent with initial HEP.    Time  4    Period  Weeks    Status  Achieved        PT Long Term Goals - 12/30/17 1319      PT LONG TERM GOAL #1   Title  Patient to be independent with advanced HEP.    Time  8    Period  Weeks    Status  On-going      PT LONG TERM  GOAL #2   Title  Patient to demonstrate [facility] R shoulder AROM/PROM.    Time  8    Period  Weeks    Status  On-going      PT LONG TERM GOAL #3   Title  Patient to demonstrate >=4+/5 R shoulder strength.    Time  8    Period  Weeks    Status  On-going      PT LONG TERM GOAL #4   Title  Patient to report tolerance of putting on bra with <=2/10 pain.    Time  8    Period  Weeks    Status  On-going      PT LONG TERM GOAL #5   Title  Patient to demonstrate overhead reaching of R UE to retrieve item from overhead shelf.    Time  8    Period  Weeks    Status  On-going            Plan - 01/14/18 1323    Clinical Impression Statement  Pt. doing well today.  Able to demo good improvement in all R shoulder PROM measurements today (see flowsheet).  Progressing well with AAROM activities per protocol and progressing well toward goals.      PT Treatment/Interventions  ADLs/Self Care Home Management;Cryotherapy;Moist Heat;Therapeutic activities;Therapeutic exercise;Manual techniques;Patient/family education;Scar mobilization;Passive range of motion;Dry needling;Energy conservation;Splinting;Taping;Vasopneumatic Device       Patient will benefit from skilled therapeutic intervention in order to improve the following deficits and impairments:  Decreased scar mobility, Decreased activity tolerance, Decreased strength, Pain, Impaired UE functional use, Improper body mechanics, Decreased range of motion, Impaired flexibility, Postural dysfunction  Visit Diagnosis: Acute pain of right shoulder  Stiffness of right shoulder, not elsewhere classified  Muscle weakness (generalized)  Other symptoms and signs involving the musculoskeletal system     Problem List Patient Active Problem List   Diagnosis Date Noted  . Acute respiratory failure ([place]) 11/10/2017  . Primary localized osteoarthrosis of right shoulder 11/10/2017  . Preoperative cardiovascular examination 10/16/2017  . PAT  (paroxysmal atrial tachycardia) ([place]) 06/17/2013  . PAC (premature atrial contraction) 06/17/2013  . Essential hypertension 06/17/2013  . Hyperlipidemia 06/17/2013  . Right shoulder  pain 01/20/2012    Bess Harvest, PTA 01/15/18 12:48 PM   [facility] [address]  [place], [place], [address] Phone: [phone]   Fax:  [phone]  Name: TIFFONY KITE MRN: [id number] Date of Birth: 08-12-1940

## 2018-01-18 ENCOUNTER — Encounter: Payer: Medicare Other | Admitting: Physical Therapy

## 2018-01-19 ENCOUNTER — Ambulatory Visit: Payer: Medicare Other

## 2018-01-19 DIAGNOSIS — M25611 Stiffness of right shoulder, not elsewhere classified: Secondary | ICD-10-CM

## 2018-01-19 DIAGNOSIS — R29898 Other symptoms and signs involving the musculoskeletal system: Secondary | ICD-10-CM | POA: Diagnosis not present

## 2018-01-19 DIAGNOSIS — M6281 Muscle weakness (generalized): Secondary | ICD-10-CM | POA: Diagnosis not present

## 2018-01-19 DIAGNOSIS — M25511 Pain in right shoulder: Secondary | ICD-10-CM

## 2018-01-19 NOTE — Therapy (Signed)
[facility] [address]  [place], [place], [address] Phone: [phone]   Fax:  [phone]  Physical Therapy Treatment  Patient Details  Name: Jasmine Swanson MRN: [id number] Date of Birth: 05-Feb-1941 Referring Provider: Marchia Bond, MD   Encounter Date: 01/19/2018  PT End of Session - 01/19/18 1319    Visit Number  8    Number of Visits  17    Date for PT Re-Evaluation  02/16/18    Authorization Type  [employer]    PT Start Time  1316    PT Stop Time  1426    PT Time Calculation (min)  70 min    Activity Tolerance  Patient tolerated treatment well    Behavior During Therapy  [facility] for tasks assessed/performed       Past Medical History:  Diagnosis Date  . Anxiety   . Arthritis   . Bundle branch block, left   . Complication of anesthesia    laryngeal spams 50 yrs ago  . Depression   . Dysphagia   . Essential hypertension 06/17/2013  . GERD (gastroesophageal reflux disease)   . Hyperlipidemia   . Kyphosis   . PAC (premature atrial contraction) 06/17/2013  . PAT (paroxysmal atrial tachycardia) ([place]) 06/17/2013  . Plantar fasciitis of right foot   . Preop cardiovascular exam 10/16/2017  . Primary localized osteoarthrosis of right shoulder 11/10/2017  . Right shoulder pain 01/20/2012    Past Surgical History:  Procedure Laterality Date  . ABDOMINAL HYSTERECTOMY    . bladder tac    . CARDIAC CATHETERIZATION    . TONSILLECTOMY    . 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  Subjective Assessment - 01/19/18 1322    Subjective  Pt. noting two days of muscular soreness following last visit which subsided.      Pertinent History  R shoulder pain, R plantar fasciitis, PAC and PAT, kyphosis, HLD, GERD, HTN, dysphagia, LBBB,  cardiac cath    Diagnostic tests  11/11/17 R shoulder xray: New right shoulder arthroplasty without postop complicating features    Patient Stated Goals  i want to get back to exercising and dancing     Currently in Pain?  No/denies    Multiple Pain Sites  No                       [facility] Adult PT Treatment/Exercise - 01/19/18 1326      Shoulder Exercises: Supine   Protraction  Right;15 reps      Shoulder Exercises: Sidelying   External Rotation  Right;10 reps;AROM    External Rotation Limitations  Required cueing for proper motion    bolster under elbow; isometric step outs    ABduction  10 reps;AROM    ABduction Limitations  0-90 dg    isometric step outs      Shoulder Exercises: Standing   External Rotation  Right;10 reps;Theraband    Theraband Level (Shoulder External Rotation)  Level 1 (Yellow)    External Rotation Limitations  neutral with elbow by side    isometric step outs    Internal Rotation  Right;10 reps    Theraband Level (Shoulder Internal Rotation)  Level 1 (Yellow)    Internal Rotation Limitations  neutral with elbow by  side    isometric step outs    Row  Both;10 reps;Theraband;Strengthening    Theraband Level (Shoulder Row)  Level 1 (Yellow)    Row Limitations  Cues for scapular retraction    Constant supervision and cueing to prevent hyperextension      Shoulder Exercises: ROM/Strengthening   Wall Wash  R flexion, scaption wall wash with L UE assistance at elbow x 10 reps       Vasopneumatic   Number Minutes Vasopneumatic   15 minutes    Vasopnuematic Location   Shoulder   R   Vasopneumatic Pressure  Low    Vasopneumatic Temperature   coldest temp       Manual Therapy   Manual Therapy  Passive ROM    Manual therapy comments  supine     Soft tissue mobilization  STM to R pec major in stretch position as tight/taut bands present - pt. noting improved comfort and visible improvement in ROM following this     Passive ROM  R shoulder PROM in  scapular plan and gentle holds/stretching to tolerance              PT Education - 01/19/18 1447    Education Details  Updated HEP     Person(s) Educated  Patient    Methods  Explanation;Demonstration;Verbal cues;Handout    Comprehension  Verbalized understanding;Returned demonstration;Verbal cues required;Need further instruction       PT Short Term Goals - 01/01/18 1205      PT SHORT TERM GOAL #1   Title  Patient to be independent with initial HEP.    Time  4    Period  Weeks    Status  Achieved        PT Long Term Goals - 12/30/17 1319      PT LONG TERM GOAL #1   Title  Patient to be independent with advanced HEP.    Time  8    Period  Weeks    Status  On-going      PT LONG TERM GOAL #2   Title  Patient to demonstrate [facility] R shoulder AROM/PROM.    Time  8    Period  Weeks    Status  On-going      PT LONG TERM GOAL #3   Title  Patient to demonstrate >=4+/5 R shoulder strength.    Time  8    Period  Weeks    Status  On-going      PT LONG TERM GOAL #4   Title  Patient to report tolerance of putting on bra with <=2/10 pain.    Time  8    Period  Weeks    Status  On-going      PT LONG TERM GOAL #5   Title  Patient to demonstrate overhead reaching of R UE to retrieve item from overhead shelf.    Time  8    Period  Weeks    Status  On-going            Plan - 01/19/18 1320    Clinical Impression Statement  Jasmine Swanson noting some muscular soreness following last visit, which subsided a few days later.  Tolerated mild progression of wall wash and introduction to RTC isometric yellow TB step-outs and sidelying abduction, ER well today.  Ended visit with ice/compression to R shoulder to decrease post-exercise soreness.  Will monitor response and adjust in coming visits.      PT Treatment/Interventions  ADLs/Self  Care Home Management;Cryotherapy;Moist Heat;Therapeutic activities;Therapeutic exercise;Manual techniques;Patient/family education;Scar  mobilization;Passive range of motion;Dry needling;Energy conservation;Splinting;Taping;Vasopneumatic Device    Consulted and Agree with Plan of Care  Patient       Patient will benefit from skilled therapeutic intervention in order to improve the following deficits and impairments:  Decreased scar mobility, Decreased activity tolerance, Decreased strength, Pain, Impaired UE functional use, Improper body mechanics, Decreased range of motion, Impaired flexibility, Postural dysfunction  Visit Diagnosis: Acute pain of right shoulder  Stiffness of right shoulder, not elsewhere classified  Muscle weakness (generalized)  Other symptoms and signs involving the musculoskeletal system     Problem List Patient Active Problem List   Diagnosis Date Noted  . Acute respiratory failure ([place]) 11/10/2017  . Primary localized osteoarthrosis of right shoulder 11/10/2017  . Preoperative cardiovascular examination 10/16/2017  . PAT (paroxysmal atrial tachycardia) ([place]) 06/17/2013  . PAC (premature atrial contraction) 06/17/2013  . Essential hypertension 06/17/2013  . Hyperlipidemia 06/17/2013  . Right shoulder pain 01/20/2012    Bess Harvest, PTA 01/19/18 2:55 PM   [facility] [address]  [place], [place], [address] Phone: [phone]   Fax:  [phone]  Name: Jasmine Swanson MRN: [id number] Date of Birth: 1940/12/31

## 2018-01-21 ENCOUNTER — Ambulatory Visit: Payer: Medicare Other

## 2018-01-21 DIAGNOSIS — R29898 Other symptoms and signs involving the musculoskeletal system: Secondary | ICD-10-CM

## 2018-01-21 DIAGNOSIS — M25611 Stiffness of right shoulder, not elsewhere classified: Secondary | ICD-10-CM

## 2018-01-21 DIAGNOSIS — M6281 Muscle weakness (generalized): Secondary | ICD-10-CM

## 2018-01-21 DIAGNOSIS — M25511 Pain in right shoulder: Secondary | ICD-10-CM

## 2018-01-21 NOTE — Therapy (Signed)
[facility] [address]  [place], [place], [address] Phone: [phone]   Fax:  [phone]  Physical Therapy Treatment  Patient Details  Name: Jasmine Swanson MRN: [id number] Date of Birth: 1941/04/20 Referring Provider: Marchia Bond, MD   Encounter Date: 01/21/2018  PT End of Session - 01/21/18 1314    Visit Number  9    Number of Visits  17    Date for PT Re-Evaluation  02/16/18    Authorization Type  [employer]    PT Start Time  1310    PT Stop Time  1355    PT Time Calculation (min)  45 min    Activity Tolerance  Patient tolerated treatment well    Behavior During Therapy  [facility] for tasks assessed/performed       Past Medical History:  Diagnosis Date  . Anxiety   . Arthritis   . Bundle branch block, left   . Complication of anesthesia    laryngeal spams 50 yrs ago  . Depression   . Dysphagia   . Essential hypertension 06/17/2013  . GERD (gastroesophageal reflux disease)   . Hyperlipidemia   . Kyphosis   . PAC (premature atrial contraction) 06/17/2013  . PAT (paroxysmal atrial tachycardia) ([place]) 06/17/2013  . Plantar fasciitis of right foot   . Preop cardiovascular exam 10/16/2017  . Primary localized osteoarthrosis of right shoulder 11/10/2017  . Right shoulder pain 01/20/2012    Past Surgical History:  Procedure Laterality Date  . ABDOMINAL HYSTERECTOMY    . bladder tac    . CARDIAC CATHETERIZATION    . TONSILLECTOMY    . 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  Subjective Assessment - 01/21/18 1314    Subjective  Pt. noting, "My arm feels  better than it's ever felt today".      Pertinent History  R shoulder pain, R plantar fasciitis, PAC and PAT, kyphosis, HLD, GERD, HTN, dysphagia, LBBB, cardiac cath    Diagnostic tests  11/11/17 R shoulder xray: New right shoulder arthroplasty without postop complicating features    Patient Stated Goals  i want to get back to exercising and dancing     Currently in Pain?  No/denies    Multiple Pain Sites  No                       [facility] Adult PT Treatment/Exercise - 01/21/18 1317      Shoulder Exercises: Supine   Protraction  Right;10 reps;Weights    Protraction Weight (lbs)  1    Protraction Limitations  Cues for proper motion       Shoulder Exercises: Sidelying   External Rotation  Right;AROM;15 reps    External Rotation Limitations  Required cueing for proper motion     ABduction  15 reps;Right;AROM    ABduction Limitations  0-95 dg    Tactile cues for scap. upward rotation      Shoulder Exercises: Standing   External Rotation  Right;15 reps;Theraband    Theraband Level (Shoulder External Rotation)  Level 1 (Yellow)    External Rotation Limitations  neutral with elbow by side     Internal Rotation  15 reps;Right    Theraband Level (Shoulder Internal Rotation)  Level 1 (Yellow)  Internal Rotation Limitations  neutral with elbow by side     Flexion  Right;10 reps;AAROM    Flexion Limitations  on wall ladder    Row  15 reps;Both    Theraband Level (Shoulder Row)  Level 1 (Yellow)    Row Limitations  Cues for scapular retraction       Shoulder Exercises: Pulleys   Flexion  3 minutes    Flexion Limitations  well tolerated     Scaption  3 minutes    Scaption Limitations  cues to avoid painful range       Shoulder Exercises: ROM/Strengthening   Wall Wash  R flexion, scaption wall wash with L UE assistance at elbow x 10 reps       Manual Therapy   Manual Therapy  Passive ROM    Manual therapy comments  supine     Passive ROM  R shoulder PROM in scapular plan and gentle holds/stretching to tolerance              PT Education - 01/21/18 1438    Education Details  HEP update    Person(s) Educated  Patient     Methods  Explanation;Demonstration;Verbal cues;Handout    Comprehension  Verbalized understanding;Returned demonstration;Verbal cues required;Need further instruction       PT Short Term Goals - 01/01/18 1205      PT SHORT TERM GOAL #1   Title  Patient to be independent with initial HEP.    Time  4    Period  Weeks    Status  Achieved        PT Long Term Goals - 12/30/17 1319      PT LONG TERM GOAL #1   Title  Patient to be independent with advanced HEP.    Time  8    Period  Weeks    Status  On-going      PT LONG TERM GOAL #2   Title  Patient to demonstrate [facility] R shoulder AROM/PROM.    Time  8    Period  Weeks    Status  On-going      PT LONG TERM GOAL #3   Title  Patient to demonstrate >=4+/5 R shoulder strength.    Time  8    Period  Weeks    Status  On-going      PT LONG TERM GOAL #4   Title  Patient to report tolerance of putting on bra with <=2/10 pain.    Time  8    Period  Weeks    Status  On-going      PT LONG TERM GOAL #5   Title  Patient to demonstrate overhead reaching of R UE to retrieve item from overhead shelf.    Time  8    Period  Weeks    Status  On-going            Plan - 01/21/18 1317    Clinical Impression Statement  Jasmine Swanson reporting, "My shoulder feels better today than it's ever felt after what we did last visit".  Progressing well with RTC isometric band activities and gentle AAROM/scapular strengthening per protocol.      PT Treatment/Interventions  ADLs/Self Care Home Management;Cryotherapy;Moist Heat;Therapeutic activities;Therapeutic exercise;Manual techniques;Patient/family education;Scar mobilization;Passive range of motion;Dry needling;Energy conservation;Splinting;Taping;Vasopneumatic Device    Consulted and Agree with Plan of Care  Patient       Patient will benefit from skilled therapeutic intervention in order to improve the following deficits and  impairments:  Decreased scar mobility, Decreased activity tolerance,  Decreased strength, Pain, Impaired UE functional use, Improper body mechanics, Decreased range of motion, Impaired flexibility, Postural dysfunction  Visit Diagnosis: Acute pain of right shoulder  Stiffness of right shoulder, not elsewhere classified  Muscle weakness (generalized)  Other symptoms and signs involving the musculoskeletal system     Problem List Patient Active Problem List   Diagnosis Date Noted  . Acute respiratory failure ([place]) 11/10/2017  . Primary localized osteoarthrosis of right shoulder 11/10/2017  . Preoperative cardiovascular examination 10/16/2017  . PAT (paroxysmal atrial tachycardia) ([place]) 06/17/2013  . PAC (premature atrial contraction) 06/17/2013  . Essential hypertension 06/17/2013  . Hyperlipidemia 06/17/2013  . Right shoulder pain 01/20/2012    Bess Harvest, PTA 01/21/18 6:59 PM   [facility] [address]  [place], [place], [address] Phone: [phone]   Fax:  [phone]  Name: Jasmine Swanson MRN: [id number] Date of Birth: 1940/11/12

## 2018-01-26 ENCOUNTER — Encounter: Payer: Self-pay | Admitting: Physical Therapy

## 2018-01-26 ENCOUNTER — Ambulatory Visit: Payer: Medicare Other | Attending: Orthopedic Surgery | Admitting: Physical Therapy

## 2018-01-26 DIAGNOSIS — R29898 Other symptoms and signs involving the musculoskeletal system: Secondary | ICD-10-CM | POA: Diagnosis not present

## 2018-01-26 DIAGNOSIS — M25611 Stiffness of right shoulder, not elsewhere classified: Secondary | ICD-10-CM | POA: Diagnosis not present

## 2018-01-26 DIAGNOSIS — M6281 Muscle weakness (generalized): Secondary | ICD-10-CM

## 2018-01-26 DIAGNOSIS — M25511 Pain in right shoulder: Secondary | ICD-10-CM | POA: Diagnosis not present

## 2018-01-26 NOTE — Therapy (Signed)
[facility] [place] [facility] [address]  [place], [place], [address] Phone: [phone]   Fax:  [phone]  Physical Therapy Progress Note  Patient Details  Name: Jasmine Swanson MRN: [id number] Date of Birth: [birth year] Referring Provider: Marchia Bond, MD   Progress Note Reporting Period 12/22/17 to 01/26/18  See note below for Objective Data and Assessment of Progress/Goals.     Encounter Date: 01/26/2018  PT End of Session - 01/26/18 1611    Visit Number  10    Number of Visits  17    Date for PT Re-Evaluation  02/16/18    Authorization Type  [employer]    PT Start Time  1313    PT Stop Time  1400    PT Time Calculation (min)  47 min    Activity Tolerance  Patient tolerated treatment well;Patient limited by pain    Behavior During Therapy  W[facility] for tasks assessed/performed       Past Medical History:  Diagnosis Date  . Anxiety   . Arthritis   . Bundle branch block, left   . Complication of anesthesia    laryngeal spams 50 yrs ago  . Depression   . Dysphagia   . Essential hypertension 06/17/2013  . GERD (gastroesophageal reflux disease)   . Hyperlipidemia   . Kyphosis   . PAC (premature atrial contraction) 06/17/2013  . PAT (paroxysmal atrial tachycardia) ([place]) 06/17/2013  . Plantar fasciitis of right foot   . Preop cardiovascular exam 10/16/2017  . Primary localized osteoarthrosis of right shoulder 11/10/2017  . Right shoulder pain 01/20/2012    Past Surgical History:  Procedure Laterality Date  . ABDOMINAL HYSTERECTOMY    . bladder tac    . CARDIAC CATHETERIZATION    . TONSILLECTOMY    . 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  Subjective Assessment - 01/26/18 1312    Subjective  Reports she is doing very well and has  been doing her exercises. When she does exercises at night, is having L shoulder pain and sensitivity to cold.  Better after placing heat on shoulder. Reports 80% improvement since initial eval. Reports improvements in pain and ROM, still not able to reach fully overhead.    Pertinent History  R shoulder pain, R plantar fasciitis, PAC and PAT, kyphosis, HLD, GERD, HTN, dysphagia, LBBB, cardiac cath    Diagnostic tests  11/11/17 R shoulder xray: New right shoulder arthroplasty without postop complicating features    Patient Stated Goals  i want to get back to exercising and dancing     Currently in Pain?  No/denies         [facility] PT Assessment - 01/26/18 0001      Observation/Other Assessments   Focus on Therapeutic Outcomes (FOTO)   Shoulder: 56 (44% limited, 38% predicted)      AROM   AROM Assessment Site  Shoulder    Right/Left Shoulder  Right    Right Shoulder Flexion  115 Degrees    Right Shoulder ABduction  75 Degrees    Right Shoulder Internal Rotation  60 Degrees    Right Shoulder External Rotation  60 Degrees      PROM   PROM Assessment Site  Shoulder    Right/Left Shoulder  Right    Right Shoulder  Flexion  134 Degrees    Right Shoulder ABduction  92 Degrees    Right Shoulder Internal Rotation  84 Degrees    Right Shoulder External Rotation  51 Degrees      Strength   Strength Assessment Site  Shoulder    Right/Left Shoulder  Right    Right Shoulder Flexion  4/5    Right Shoulder ABduction  4-/5    Right Shoulder Internal Rotation  3+/5    Right Shoulder External Rotation  3+/5                   [facility] Adult PT Treatment/Exercise - 01/26/18 0001      Shoulder Exercises: Supine   Flexion  AROM;Right;10 reps;Limitations    Flexion Limitations  supine flexion AROM to tolerance      Shoulder Exercises: Sidelying   External Rotation  Right;AROM;15 reps    External Rotation Limitations  dowel under elbow; cues to stay parallel to floor    ABduction   Right;AROM;10 reps   cues for alignment   ABduction Limitations  0-95 dg       Shoulder Exercises: Standing   External Rotation  Right;Theraband;10 reps;Strengthening   cues for alignment   Theraband Level (Shoulder External Rotation)  Level 1 (Yellow)    External Rotation Limitations  neutral with elbow by side     Internal Rotation  Strengthening;Right;10 reps;Theraband;Limitations    Theraband Level (Shoulder Internal Rotation)  Level 1 (Yellow)    Internal Rotation Limitations  neutral with elbow by side     Flexion  AROM;Right;5 reps;Limitations    Flexion Limitations  overhead flexion to retrieve light object from overhead shelf; requireing standing on toes to reach object      Shoulder Exercises: Pulleys   Flexion  3 minutes    Flexion Limitations  to tolerance    Scaption  3 minutes    Scaption Limitations  to tolerance      Manual Therapy   Manual Therapy  Passive ROM    Passive ROM  R shoulder PROM in all planes to tolerance with prolonged holds               PT Short Term Goals - 01/26/18 1317      PT SHORT TERM GOAL #1   Title  Patient to be independent with initial HEP.    Time  4    Period  Weeks    Status  Achieved        PT Long Term Goals - 01/26/18 1328      PT LONG TERM GOAL #1   Title  Patient to be independent with advanced HEP.    Time  8    Period  Weeks    Status  On-going   met for current     PT LONG TERM GOAL #2   Title  Patient to demonstrate [facility] R shoulder AROM/PROM.    Time  8    Period  Weeks    Status  On-going   able to tolerate R shoulder AROM, PROM improved in ER     PT LONG TERM GOAL #3   Title  Patient to demonstrate >=4+/5 R shoulder strength.    Time  8    Period  Weeks    Status  Partially Met   improving, most limited in ER/IR     PT LONG TERM GOAL #4   Title  Patient to report tolerance of putting on bra with <=2/10  pain.    Time  8    Period  Weeks    Status  Partially Met   no pain with front closure  bra     PT LONG TERM GOAL #5   Title  Patient to demonstrate overhead reaching of R UE to retrieve item from overhead shelf.    Time  8    Period  Weeks    Status  Partially Met   able to reach overhead with raising up on toes as compensation           Plan - 01/26/18 1611    Clinical Impression Statement  Patient arrived to session with report of 80% improvement in R shoulder since initial eval. Notes improvements pain level and ROM, still would like to work on reaching overhead. Patient also with report of intermittent L shoulder pain at night- not sure of what may be aggravating this pain. Updated goals- patient now able to tolerate R shoulder AROM and demonstrated improvement in ER PROM. Strength improving at this time, but most limited in ER and IR. Patient reports no pain when donning a front closure bra at this time. Able to reach overhead with R UE while raising on toes as compensation. Worked on improving PROM and AROM this session. Patient with good tolerance of all activities with R shoulder, however limited by back pain today. Intermittent cues required to correct form during isometric IR/ER walkouts with light resistance. Ended session with no c/o pain. Patient showing good improvements in functional ROM and strength thus far, would benefit from skilled PT services to address remaining goals. Patient in agreement.     PT Treatment/Interventions  ADLs/Self Care Home Management;Cryotherapy;Moist Heat;Therapeutic activities;Therapeutic exercise;Manual techniques;Patient/family education;Scar mobilization;Passive range of motion;Dry needling;Energy conservation;Splinting;Taping;Vasopneumatic Device    Consulted and Agree with Plan of Care  Patient       Patient will benefit from skilled therapeutic intervention in order to improve the following deficits and impairments:  Decreased scar mobility, Decreased activity tolerance, Decreased strength, Pain, Impaired UE functional use,  Improper body mechanics, Decreased range of motion, Impaired flexibility, Postural dysfunction  Visit Diagnosis: Acute pain of right shoulder  Stiffness of right shoulder, not elsewhere classified  Muscle weakness (generalized)  Other symptoms and signs involving the musculoskeletal system     Problem List Patient Active Problem List   Diagnosis Date Noted  . Acute respiratory failure ([place]) 11/10/2017  . Primary localized osteoarthrosis of right shoulder 11/10/2017  . Preoperative cardiovascular examination 10/16/2017  . PAT (paroxysmal atrial tachycardia) ([place]) 06/17/2013  . PAC (premature atrial contraction) 06/17/2013  . Essential hypertension 06/17/2013  . Hyperlipidemia 06/17/2013  . Right shoulder pain 01/20/2012    Janene Harvey, PT, DPT 01/26/18 [facility] [address]  [place], [place], [address] Phone: [phone]   Fax:  [phone]  Name: Jasmine Swanson MRN: [id number] Date of Birth: 11-23-1940

## 2018-01-28 ENCOUNTER — Ambulatory Visit: Payer: Medicare Other

## 2018-01-28 DIAGNOSIS — M25511 Pain in right shoulder: Secondary | ICD-10-CM

## 2018-01-28 DIAGNOSIS — R29898 Other symptoms and signs involving the musculoskeletal system: Secondary | ICD-10-CM

## 2018-01-28 DIAGNOSIS — M6281 Muscle weakness (generalized): Secondary | ICD-10-CM | POA: Diagnosis not present

## 2018-01-28 DIAGNOSIS — M25611 Stiffness of right shoulder, not elsewhere classified: Secondary | ICD-10-CM

## 2018-01-28 NOTE — Therapy (Signed)
[facility] [address]  [place], [place], [address] Phone: [phone]   Fax:  [phone]  Physical Therapy Treatment  Patient Details  Name: Jasmine Swanson MRN: [id number] Date of Birth: Aug 22, 1940 Referring Provider: Marchia Bond, MD   Encounter Date: 01/28/2018  PT End of Session - 01/28/18 1321    Visit Number  11    Number of Visits  17    Date for PT Re-Evaluation  02/16/18    Authorization Type  [employer]    PT Start Time  2585    PT Stop Time  1359    PT Time Calculation (min)  42 min    Activity Tolerance  Patient tolerated treatment well;Patient limited by pain    Behavior During Therapy  [facility] for tasks assessed/performed       Past Medical History:  Diagnosis Date  . Anxiety   . Arthritis   . Bundle branch block, left   . Complication of anesthesia    laryngeal spams 50 yrs ago  . Depression   . Dysphagia   . Essential hypertension 06/17/2013  . GERD (gastroesophageal reflux disease)   . Hyperlipidemia   . Kyphosis   . PAC (premature atrial contraction) 06/17/2013  . PAT (paroxysmal atrial tachycardia) ([place]) 06/17/2013  . Plantar fasciitis of right foot   . Preop cardiovascular exam 10/16/2017  . Primary localized osteoarthrosis of right shoulder 11/10/2017  . Right shoulder pain 01/20/2012    Past Surgical History:  Procedure Laterality Date  . ABDOMINAL HYSTERECTOMY    . bladder tac    . CARDIAC CATHETERIZATION    . TONSILLECTOMY    . 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  Subjective Assessment - 01/28/18 1319    Subjective  Pt. noting R shoulder feeling fine today however L shoulder soreness from shingles shot two days ago.      Pertinent History  R shoulder pain, R plantar fasciitis, PAC and  PAT, kyphosis, HLD, GERD, HTN, dysphagia, LBBB, cardiac cath    Diagnostic tests  11/11/17 R shoulder xray: New right shoulder arthroplasty without postop complicating features    Patient Stated Goals  i want to get back to exercising and dancing     Currently in Pain?  No/denies    Multiple Pain Sites  No                       [facility] Adult PT Treatment/Exercise - 01/28/18 1328      Shoulder Exercises: Supine   Protraction  Both;15 reps    Protraction Weight (lbs)  1    Other Supine Exercises  R shoulder circles ~ 18" x 15 CW, CCW       Shoulder Exercises: Standing   External Rotation  Right;10 reps;Theraband;Strengthening    Theraband Level (Shoulder External Rotation)  Level 1 (Yellow)    External Rotation Limitations  neutral with elbow by side     Internal Rotation  Strengthening;Right;10 reps;Theraband;Limitations    Theraband Level (Shoulder Internal Rotation)  Level 1 (Yellow)    Internal Rotation Limitations  neutral with elbow by side     Extension  Both;10 reps;Theraband;Strengthening    Theraband Level (Shoulder Extension)  Level 1 (Yellow)    Extension Limitations  scapular retraction  Row  Both;10 reps;Theraband;Strengthening    Theraband Level (Shoulder Row)  Level 2 (Red)      Shoulder Exercises: Pulleys   Flexion  3 minutes    Flexion Limitations  to tolerance    Scaption  3 minutes    Scaption Limitations  to tolerance      Manual Therapy   Manual Therapy  Passive ROM    Manual therapy comments  supine     Passive ROM  R shoulder PROM in all planes to tolerance with prolonged holds               PT Short Term Goals - 01/26/18 1317      PT SHORT TERM GOAL #1   Title  Patient to be independent with initial HEP.    Time  4    Period  Weeks    Status  Achieved        PT Long Term Goals - 01/26/18 1328      PT LONG TERM GOAL #1   Title  Patient to be independent with advanced HEP.    Time  8    Period  Weeks    Status   On-going   met for current     PT LONG TERM GOAL #2   Title  Patient to demonstrate [facility] R shoulder AROM/PROM.    Time  8    Period  Weeks    Status  On-going   able to tolerate R shoulder AROM, PROM improved in ER     PT LONG TERM GOAL #3   Title  Patient to demonstrate >=4+/5 R shoulder strength.    Time  8    Period  Weeks    Status  Partially Met   improving, most limited in ER/IR     PT LONG TERM GOAL #4   Title  Patient to report tolerance of putting on bra with <=2/10 pain.    Time  8    Period  Weeks    Status  Partially Met   no pain with front closure bra     PT LONG TERM GOAL #5   Title  Patient to demonstrate overhead reaching of R UE to retrieve item from overhead shelf.    Time  8    Period  Weeks    Status  Partially Met   able to reach overhead with raising up on toes as compensation           Plan - 01/28/18 1321    Clinical Impression Statement  Jasmine Swanson doing well today.  Did have some L shoulder soreness following recent shingles shot however notes R shoulder has been feeling well.  Tolerated progression of therex activities well today per protocol.  Ended visit pain free thus modalities deferred.      PT Treatment/Interventions  ADLs/Self Care Home Management;Cryotherapy;Moist Heat;Therapeutic activities;Therapeutic exercise;Manual techniques;Patient/family education;Scar mobilization;Passive range of motion;Dry needling;Energy conservation;Splinting;Taping;Vasopneumatic Device    Consulted and Agree with Plan of Care  Patient       Patient will benefit from skilled therapeutic intervention in order to improve the following deficits and impairments:  Decreased scar mobility, Decreased activity tolerance, Decreased strength, Pain, Impaired UE functional use, Improper body mechanics, Decreased range of motion, Impaired flexibility, Postural dysfunction  Visit Diagnosis: Acute pain of right shoulder  Stiffness of right shoulder, not elsewhere  classified  Muscle weakness (generalized)  Other symptoms and signs involving the musculoskeletal system     Problem List Patient Active  Problem List   Diagnosis Date Noted  . Acute respiratory failure ([place]) 11/10/2017  . Primary localized osteoarthrosis of right shoulder 11/10/2017  . Preoperative cardiovascular examination 10/16/2017  . PAT (paroxysmal atrial tachycardia) ([place]) 06/17/2013  . PAC (premature atrial contraction) 06/17/2013  . Essential hypertension 06/17/2013  . Hyperlipidemia 06/17/2013  . Right shoulder pain 01/20/2012    Bess Harvest, PTA 01/29/18 11:54 AM   [facility] [address]  [place], [place], [address] Phone: [phone]   Fax:  [phone]  Name: Jasmine Swanson MRN: [id number] Date of Birth: 1941-05-06

## 2018-02-01 ENCOUNTER — Ambulatory Visit: Payer: Medicare Other

## 2018-02-01 DIAGNOSIS — R29898 Other symptoms and signs involving the musculoskeletal system: Secondary | ICD-10-CM | POA: Diagnosis not present

## 2018-02-01 DIAGNOSIS — M25511 Pain in right shoulder: Secondary | ICD-10-CM | POA: Diagnosis not present

## 2018-02-01 DIAGNOSIS — M25611 Stiffness of right shoulder, not elsewhere classified: Secondary | ICD-10-CM | POA: Diagnosis not present

## 2018-02-01 DIAGNOSIS — M6281 Muscle weakness (generalized): Secondary | ICD-10-CM | POA: Diagnosis not present

## 2018-02-01 NOTE — Therapy (Addendum)
[facility] [address]  [place], [place], [address] Phone: [phone]   Fax:  [phone]  Physical Therapy Treatment  Patient Details  Name: Jasmine Swanson MRN: [id number] Date of Birth: [birth year] Referring Provider: Marchia Bond, MD   Encounter Date: 02/01/2018  PT End of Session - 02/01/18 1320    Visit Number  12    Number of Visits  17    Date for PT Re-Evaluation  02/16/18    Authorization Type  [employer]    PT Start Time  1315    PT Stop Time  1400    PT Time Calculation (min)  45 min    Activity Tolerance  Patient tolerated treatment well    Behavior During Therapy  [facility] for tasks assessed/performed       Past Medical History:  Diagnosis Date  . Anxiety   . Arthritis   . Bundle branch block, left   . Complication of anesthesia    laryngeal spams 50 yrs ago  . Depression   . Dysphagia   . Essential hypertension 06/17/2013  . GERD (gastroesophageal reflux disease)   . Hyperlipidemia   . Kyphosis   . PAC (premature atrial contraction) 06/17/2013  . PAT (paroxysmal atrial tachycardia) ([place]) 06/17/2013  . Plantar fasciitis of right foot   . Preop cardiovascular exam 10/16/2017  . Primary localized osteoarthrosis of right shoulder 11/10/2017  . Right shoulder pain 01/20/2012    Past Surgical History:  Procedure Laterality Date  . ABDOMINAL HYSTERECTOMY    . bladder tac    . CARDIAC CATHETERIZATION    . TONSILLECTOMY    . 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  Subjective Assessment - 02/01/18 1318    Subjective  Doing well today.      Pertinent History  R shoulder pain, R plantar fasciitis, PAC and PAT, kyphosis, HLD, GERD, HTN, dysphagia, LBBB, cardiac cath    Diagnostic tests  11/11/17 R shoulder xray:  New right shoulder arthroplasty without postop complicating features    Patient Stated Goals  i want to get back to exercising and dancing     Currently in Pain?  Yes    Pain Score  --   up to 10/10 with twisting bottle tops    Pain Location  Hand    Pain Orientation  Left    Pain Descriptors / Indicators  Aching    Pain Type  Chronic pain    Pain Onset  More than a month ago    Aggravating Factors   using it     Multiple Pain Sites  No                       [facility] Adult PT Treatment/Exercise - 02/01/18 1347      Shoulder Exercises: Supine   Protraction  Both;15 reps    Protraction Weight (lbs)  2      Shoulder Exercises: Prone   Extension  Both;10 reps;Strengthening    Extension Limitations  Prone "I's" on green p-ball     Horizontal ABduction 1  Both;10 reps;Strengthening    Horizontal ABduction 1 Limitations  Prone "T's" on green p-ball       Shoulder Exercises: Standing   Flexion  Both;10 reps  Flexion Limitations  back on wall to prevent back arch   reduction in R UT involvement with cueing and mirror      Shoulder Exercises: ROM/Strengthening   UBE (Upper Arm Bike)  Lvl 1.0, 2.5 min forw/back      Shoulder Exercises: Stretch   Corner Stretch  3 reps;30 seconds    Corner Stretch Limitations  low and mid doorway with cues for gentle stretch      Manual Therapy   Manual Therapy  Passive ROM;Soft tissue mobilization    Manual therapy comments  supine     Soft tissue mobilization  STM to R posterior/inferior shoulder and R lats stretch     Myofascial Release  TPR to R [place]    Taut bands in R Teres and lats    Passive ROM  R shoulder PROM in all planes to tolerance with prolonged holds             PT Education - 02/02/18 1235    Education Details  HEP update    Person(s) Educated  Patient    Methods  Explanation;Demonstration;Verbal cues;Handout    Comprehension  Verbalized understanding;Returned demonstration;Verbal cues required;Need  further instruction       PT Short Term Goals - 01/26/18 1317      PT SHORT TERM GOAL #1   Title  Patient to be independent with initial HEP.    Time  4    Period  Weeks    Status  Achieved        PT Long Term Goals - 01/26/18 1328      PT LONG TERM GOAL #1   Title  Patient to be independent with advanced HEP.    Time  8    Period  Weeks    Status  On-going   met for current     PT LONG TERM GOAL #2   Title  Patient to demonstrate [facility] R shoulder AROM/PROM.    Time  8    Period  Weeks    Status  On-going   able to tolerate R shoulder AROM, PROM improved in ER     PT LONG TERM GOAL #3   Title  Patient to demonstrate >=4+/5 R shoulder strength.    Time  8    Period  Weeks    Status  Partially Met   improving, most limited in ER/IR     PT LONG TERM GOAL #4   Title  Patient to report tolerance of putting on bra with <=2/10 pain.    Time  8    Period  Weeks    Status  Partially Met   no pain with front closure bra     PT LONG TERM GOAL #5   Title  Patient to demonstrate overhead reaching of R UE to retrieve item from overhead shelf.    Time  8    Period  Weeks    Status  Partially Met   able to reach overhead with raising up on toes as compensation           Plan - 02/01/18 1321    Clinical Impression Statement  Doing well today.  Tolerated progression of scapular and flexion strengthening activities well today.  Addressed taut/tight bands in R posterior/inferior shoulder musculature with manual therapy with good response.  Updated HEP with doorway pec stretch and cues to perform with gentle pressure.  Ongoing anterior pec tightness and would benefit from further skilled manual therapy to improve  ROM and functional ability.  Will continue to progress toward goals.      PT Treatment/Interventions  ADLs/Self Care Home Management;Cryotherapy;Moist Heat;Therapeutic activities;Therapeutic exercise;Manual techniques;Patient/family education;Scar mobilization;Passive  range of motion;Dry needling;Energy conservation;Splinting;Taping;Vasopneumatic Device    Consulted and Agree with Plan of Care  Patient       Patient will benefit from skilled therapeutic intervention in order to improve the following deficits and impairments:  Decreased scar mobility, Decreased activity tolerance, Decreased strength, Pain, Impaired UE functional use, Improper body mechanics, Decreased range of motion, Impaired flexibility, Postural dysfunction  Visit Diagnosis: Acute pain of right shoulder  Stiffness of right shoulder, not elsewhere classified  Muscle weakness (generalized)  Other symptoms and signs involving the musculoskeletal system     Problem List Patient Active Problem List   Diagnosis Date Noted  . Acute respiratory failure ([facility]) 11/10/2017  . Primary localized osteoarthrosis of right shoulder 11/10/2017  . Preoperative cardiovascular examination 10/16/2017  . PAT (paroxysmal atrial tachycardia) ([facility]) 06/17/2013  . PAC (premature atrial contraction) 06/17/2013  . Essential hypertension 06/17/2013  . Hyperlipidemia 06/17/2013  . Right shoulder pain 01/20/2012    Micah Denny, PTA 02/02/18 12:35 PM   [facility] [address]  [address] [place], [place], [address] Phone: [phone]   Fax:  [phone]  Name: Jasmine Swanson MRN: [id number] Date of Birth: 11/23/1940   

## 2018-02-04 ENCOUNTER — Ambulatory Visit: Payer: Medicare Other

## 2018-02-04 DIAGNOSIS — R29898 Other symptoms and signs involving the musculoskeletal system: Secondary | ICD-10-CM

## 2018-02-04 DIAGNOSIS — M6281 Muscle weakness (generalized): Secondary | ICD-10-CM | POA: Diagnosis not present

## 2018-02-04 DIAGNOSIS — M25611 Stiffness of right shoulder, not elsewhere classified: Secondary | ICD-10-CM

## 2018-02-04 DIAGNOSIS — M25511 Pain in right shoulder: Secondary | ICD-10-CM | POA: Diagnosis not present

## 2018-02-04 NOTE — Therapy (Signed)
[facility] [place] [facility] [address]  [place], [place], [address] Phone: [phone]   Fax:  [phone]  Physical Therapy Treatment  Patient Details  Name: Jasmine Swanson MRN: [id number] Date of Birth: July 23, 1940 Referring Provider: Marchia Bond, MD   Encounter Date: 02/04/2018  PT End of Session - 02/04/18 1318    Visit Number  13    Number of Visits  17    Date for PT Re-Evaluation  02/16/18    Authorization Type  [employer]    PT Start Time  1315    PT Stop Time  1411    PT Time Calculation (min)  56 min    Activity Tolerance  Patient tolerated treatment well    Behavior During Therapy  [facility] for tasks assessed/performed       Past Medical History:  Diagnosis Date  . Anxiety   . Arthritis   . Bundle branch block, left   . Complication of anesthesia    laryngeal spams 50 yrs ago  . Depression   . Dysphagia   . Essential hypertension 06/17/2013  . GERD (gastroesophageal reflux disease)   . Hyperlipidemia   . Kyphosis   . PAC (premature atrial contraction) 06/17/2013  . PAT (paroxysmal atrial tachycardia) ([place]) 06/17/2013  . Plantar fasciitis of right foot   . Preop cardiovascular exam 10/16/2017  . Primary localized osteoarthrosis of right shoulder 11/10/2017  . Right shoulder pain 01/20/2012    Past Surgical History:  Procedure Laterality Date  . ABDOMINAL HYSTERECTOMY    . bladder tac    . CARDIAC CATHETERIZATION    . TONSILLECTOMY    . 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  Subjective Assessment - 02/04/18 1317    Subjective  Pt. reporting she [place] at the beach on [place] and returning to therapy on 9.24.19.      Pertinent History  R shoulder pain, R plantar fasciitis, PAC and PAT, kyphosis, HLD, GERD, HTN,  dysphagia, LBBB, cardiac cath    Diagnostic tests  11/11/17 R shoulder xray: New right shoulder arthroplasty without postop complicating features    Patient Stated Goals  i want to get back to exercising and dancing     Currently in Pain?  No/denies    Pain Score  0-No pain    Multiple Pain Sites  No                       [facility] Adult PT Treatment/Exercise - 02/04/18 1320      Shoulder Exercises: Supine   Protraction  Both;15 reps    Protraction Weight (lbs)  2    Flexion  5 reps;Right;Left;Theraband    Flexion Limitations  Alternating flexion/extension with red TB in hooklying     Other Supine Exercises  R shoulder circles 2# ~ 12" x 15 CW, CCW     Other Supine Exercises  R shoulder rhythmic stablization in sustained 90 dg position x 30 sec       Shoulder Exercises: Sidelying   External Rotation  Right;AROM;15 reps    External Rotation Weight (lbs)  1    External Rotation Limitations  dowel under elbow     ABduction  Right;AROM;15 reps    ABduction Limitations  0-110 dg    Improved  control      Shoulder Exercises: Standing   External Rotation  Right;15 reps;Strengthening;Theraband    Theraband Level (Shoulder External Rotation)  Level 1 (Yellow)    External Rotation Limitations  Cues for neutral positioning     Internal Rotation  Strengthening;Right;10 reps;Theraband;Limitations    Theraband Level (Shoulder Internal Rotation)  Level 2 (Red)    Internal Rotation Limitations  neutral with elbow by side     Extension  15 reps;Both;Strengthening;Theraband    Theraband Level (Shoulder Extension)  Level 1 (Yellow)    Row  15 reps;Both;Strengthening;Theraband      Shoulder Exercises: ROM/Strengthening   UBE (Upper Arm Bike)  Lvl 1.0, 3 min forw/back    Wall Pushups  15 reps    Wall Pushups Limitations  Serratus punch on wall       Vasopneumatic   Number Minutes Vasopneumatic   10 minutes    Vasopnuematic Location   Shoulder   R    Vasopneumatic Pressure  Low     Vasopneumatic Temperature   coldest temp       Manual Therapy   Passive ROM  R shoulder PROM in all planes to tolerance with prolonged holds             PT Education - 02/04/18 1427    Education Details  HEP update     Person(s) Educated  Patient    Methods  Explanation;Demonstration;Verbal cues;Handout    Comprehension  Verbalized understanding;Returned demonstration;Verbal cues required;Need further instruction       PT Short Term Goals - 01/26/18 1317      PT SHORT TERM GOAL #1   Title  Patient to be independent with initial HEP.    Time  4    Period  Weeks    Status  Achieved        PT Long Term Goals - 01/26/18 1328      PT LONG TERM GOAL #1   Title  Patient to be independent with advanced HEP.    Time  8    Period  Weeks    Status  On-going   met for current     PT LONG TERM GOAL #2   Title  Patient to demonstrate [facility] R shoulder AROM/PROM.    Time  8    Period  Weeks    Status  On-going   able to tolerate R shoulder AROM, PROM improved in ER     PT LONG TERM GOAL #3   Title  Patient to demonstrate >=4+/5 R shoulder strength.    Time  8    Period  Weeks    Status  Partially Met   improving, most limited in ER/IR     PT LONG TERM GOAL #4   Title  Patient to report tolerance of putting on bra with <=2/10 pain.    Time  8    Period  Weeks    Status  Partially Met   no pain with front closure bra     PT LONG TERM GOAL #5   Title  Patient to demonstrate overhead reaching of R UE to retrieve item from overhead shelf.    Time  8    Period  Weeks    Status  Partially Met   able to reach overhead with raising up on toes as compensation           Plan - 02/04/18 1319    Clinical Impression Statement  Pt. doing well today and with  plans to travel over next ~ 2 weeks thus updated HEP with theraband strengthening activities for scapular/RTC musculature.  Pt. able to perform all updated HEP activities with good overall technique and will plan to  monitor response upon pt. return to therapy on 9.24.19.  Progressing well.      PT Treatment/Interventions  ADLs/Self Care Home Management;Cryotherapy;Moist Heat;Therapeutic activities;Therapeutic exercise;Manual techniques;Patient/family education;Scar mobilization;Passive range of motion;Dry needling;Energy conservation;Splinting;Taping;Vasopneumatic Device    Consulted and Agree with Plan of Care  Patient       Patient will benefit from skilled therapeutic intervention in order to improve the following deficits and impairments:  Decreased scar mobility, Decreased activity tolerance, Decreased strength, Pain, Impaired UE functional use, Improper body mechanics, Decreased range of motion, Impaired flexibility, Postural dysfunction  Visit Diagnosis: Acute pain of right shoulder  Stiffness of right shoulder, not elsewhere classified  Muscle weakness (generalized)  Other symptoms and signs involving the musculoskeletal system     Problem List Patient Active Problem List   Diagnosis Date Noted  . Acute respiratory failure ([place]) 11/10/2017  . Primary localized osteoarthrosis of right shoulder 11/10/2017  . Preoperative cardiovascular examination 10/16/2017  . PAT (paroxysmal atrial tachycardia) ([place]) 06/17/2013  . PAC (premature atrial contraction) 06/17/2013  . Essential hypertension 06/17/2013  . Hyperlipidemia 06/17/2013  . Right shoulder pain 01/20/2012    Bess Harvest, PTA 02/04/18 6:13 PM   [facility] [place] [address]  [place], [place], [address] Phone: [phone]   Fax:  [phone]  Name: Jasmine Swanson MRN: [id number] Date of Birth: [birth year]

## 2018-02-15 ENCOUNTER — Other Ambulatory Visit: Payer: Self-pay | Admitting: Family Medicine

## 2018-02-15 ENCOUNTER — Encounter: Payer: Medicare Other | Admitting: Physical Therapy

## 2018-02-15 DIAGNOSIS — M19011 Primary osteoarthritis, right shoulder: Secondary | ICD-10-CM | POA: Diagnosis not present

## 2018-02-15 DIAGNOSIS — Z1231 Encounter for screening mammogram for malignant neoplasm of breast: Secondary | ICD-10-CM

## 2018-02-16 ENCOUNTER — Encounter: Payer: Self-pay | Admitting: Physical Therapy

## 2018-02-16 ENCOUNTER — Ambulatory Visit: Payer: Medicare Other | Admitting: Physical Therapy

## 2018-02-16 DIAGNOSIS — M25511 Pain in right shoulder: Secondary | ICD-10-CM | POA: Diagnosis not present

## 2018-02-16 DIAGNOSIS — M6281 Muscle weakness (generalized): Secondary | ICD-10-CM

## 2018-02-16 DIAGNOSIS — M25611 Stiffness of right shoulder, not elsewhere classified: Secondary | ICD-10-CM

## 2018-02-16 DIAGNOSIS — R29898 Other symptoms and signs involving the musculoskeletal system: Secondary | ICD-10-CM

## 2018-02-16 NOTE — Therapy (Signed)
[facility] [place] [facility] [address]  [place], [place], [address] Phone: [phone]   Fax:  [phone]  Physical Therapy Treatment  Patient Details  Name: Jasmine Swanson MRN: [id number] Date of Birth: 1940-08-03 Referring Provider: Marchia Bond, MD   Encounter Date: 02/16/2018  PT End of Session - 02/16/18 1657    Visit Number  14    Number of Visits  18    Date for PT Re-Evaluation  03/16/18    Authorization Type  [employer]    PT Start Time  1312    PT Stop Time  1359    PT Time Calculation (min)  47 min    Activity Tolerance  Patient tolerated treatment well    Behavior During Therapy  [facility] for tasks assessed/performed       Past Medical History:  Diagnosis Date  . Anxiety   . Arthritis   . Bundle branch block, left   . Complication of anesthesia    laryngeal spams 50 yrs ago  . Depression   . Dysphagia   . Essential hypertension 06/17/2013  . GERD (gastroesophageal reflux disease)   . Hyperlipidemia   . Kyphosis   . PAC (premature atrial contraction) 06/17/2013  . PAT (paroxysmal atrial tachycardia) ([place]) 06/17/2013  . Plantar fasciitis of right foot   . Preop cardiovascular exam 10/16/2017  . Primary localized osteoarthrosis of right shoulder 11/10/2017  . Right shoulder pain 01/20/2012    Past Surgical History:  Procedure Laterality Date  . ABDOMINAL HYSTERECTOMY    . bladder tac    . CARDIAC CATHETERIZATION    . TONSILLECTOMY    . 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  Subjective Assessment - 02/16/18 1313    Subjective  Reports her MD appointment went well. Has been doing HEP religiously while at the beach. Reports 95% improved since initial eval. Reports improvement in ability to perform overhead reaching, pain,  opening doors, driving, doing hair.     Pertinent History  R shoulder pain, R plantar fasciitis, PAC and PAT, kyphosis, HLD, GERD, HTN, dysphagia, LBBB, cardiac cath    Diagnostic tests  11/11/17 R shoulder xray: New right shoulder arthroplasty without postop complicating features    Patient Stated Goals  i want to get back to exercising and dancing     Currently in Pain?  No/denies         [facility] PT Assessment - 02/16/18 0001      Assessment   Medical Diagnosis  s/p R total shoulder replacement    Referring Provider  Marchia Bond, MD    Onset Date/Surgical Date  11/10/17      AROM   AROM Assessment Site  Shoulder    Right/Left Shoulder  Right    Right Shoulder Flexion  123 Degrees    Right Shoulder ABduction  81 Degrees    Right Shoulder Internal Rotation  50 Degrees    Right Shoulder External Rotation  69 Degrees      PROM   PROM Assessment Site  Shoulder    Right/Left Shoulder  Right    Right Shoulder Flexion  139 Degrees    Right Shoulder ABduction  137 Degrees   scaption   Right Shoulder Internal Rotation  85 Degrees    Right Shoulder External Rotation  46 Degrees      Strength   Strength Assessment Site  Shoulder    Right/Left Shoulder  Right    Right Shoulder Flexion  4/5    Right Shoulder ABduction  4-/5    Right Shoulder Internal Rotation  3+/5    Right Shoulder External Rotation  3+/5                   [facility] Adult PT Treatment/Exercise - 02/16/18 0001      Exercises   Exercises  Shoulder;Elbow      Shoulder Exercises: Supine   Protraction  Both;15 reps    Protraction Weight (lbs)  3    Protraction Limitations  good form    Other Supine Exercises  R shoulder rhythmic stablization in sustained 90 dg position 2x30 sec       Shoulder Exercises: Sidelying   External Rotation  Right;AROM;10 reps;Weights    External Rotation Weight (lbs)  1, 2    External Rotation Limitations  dowel under elbow; 10x 1#; 2x5 2#      Shoulder Exercises: Standing    Protraction  Strengthening;Both;15 reps;Limitations    Protraction Limitations  serratus wall punches with cues to maintain straight elbows    External Rotation  Right;15 reps;Strengthening;Theraband    Theraband Level (Shoulder External Rotation)  Level 1 (Yellow)    External Rotation Limitations  dowel under elbow for neutral positioning    Internal Rotation  Strengthening;Right;Theraband;Limitations;15 reps    Theraband Level (Shoulder Internal Rotation)  Level 2 (Red)    Internal Rotation Weight (lbs)  dowel under elbow for neutral positioning    Flexion  Strengthening;Right;10 reps;Weights;Limitations   pt with c/o LBP but tolerable   Shoulder Flexion Weight (lbs)  1    Flexion Limitations  cues to avoid shoulder elevation    ABduction  AROM;Right;10 reps;Limitations    ABduction Limitations  cues to avoid shoulder hiking and jerking motion      Shoulder Exercises: Pulleys   Flexion  3 minutes    Flexion Limitations  to tolerance    Scaption  3 minutes    Scaption Limitations  to tolerance      Manual Therapy   Manual Therapy  Passive ROM    Passive ROM  R shoulder PROM in all planes to tolerance with prolonged holds   good tolerance and no c/o pain at end ranged            PT Education - 02/16/18 1656    Education Details  update to HEP    Person(s) Educated  Patient    Methods  Explanation;Demonstration;Tactile cues;Verbal cues;Handout    Comprehension  Verbalized understanding;Returned demonstration       PT Short Term Goals - 02/16/18 1323      PT SHORT TERM GOAL #1   Title  Patient to be independent with initial HEP.    Time  4    Period  Weeks    Status  Achieved        PT Long Term Goals - 02/16/18 1324      PT LONG TERM GOAL #1   Title  Patient to be independent with advanced HEP.    Time  4    Period  Weeks    Status  Partially Met   met for current   Target Date  03/16/18      PT LONG TERM GOAL #2   Title  Patient to demonstrate [facility] R  shoulder AROM/PROM.  Time  4    Period  Weeks    Status  On-going   improvements noted in R shoulder flexion, ABD, ER AROM and flexion, ABD, IR PROM   Target Date  03/16/18      PT LONG TERM GOAL #3   Title  Patient to demonstrate >=4+/5 R shoulder strength.    Time  4    Period  Weeks    Status  On-going   strength testing unchanged    Target Date  03/16/18      PT LONG TERM GOAL #4   Title  Patient to report tolerance of putting on bra with <=2/10 pain.    Time  8    Period  Weeks    Status  Achieved   notes no pain or difficulty with this activity     PT LONG TERM GOAL #5   Title  Patient to demonstrate overhead reaching of R UE to retrieve item from overhead shelf.    Time  4    Period  Weeks    Status  Partially Met   able to reach overhead with mild R shoulder elevation   Target Date  03/16/18            Plan - 02/16/18 1703    Clinical Impression Statement  Patient arrived to session after MD appointment yesterday with report that MD was pleased with patient's ROM. Patient reports 95% improvement since initial eval- noting improvements in overhead reaching, pain, opening doors, driving, and doing her hair. Notes she is still not full back to her hobbies such as shag dancing d/t fear of hurting her shoulder. Updated goals- improvements noted in R shoulder flexion, ABD, ER AROM and flexion, ABD, IR PROM. Strength testing remains unchanged. Patient now able to don bra without pain or difficulty. Still exhibiting mild R shoulder hiking with overhead reaching activities. Patient tolerated all manual therapy and R shoulder ther-ex without c/o pain. Intermittent cues required to correct form. Added standing shoulder flexion and abduction to HEP as patient reporting back pain with this activity in sitting. Patient received handout and reported understanding. Patient has shown excellent ROM improvement since POC, would benefit from additional skilled PT services 1x/week for 4  weeks to address strength and functional ability impairments.     PT Frequency  1x / week    PT Duration  4 weeks    PT Treatment/Interventions  ADLs/Self Care Home Management;Cryotherapy;Moist Heat;Therapeutic activities;Therapeutic exercise;Manual techniques;Patient/family education;Scar mobilization;Passive range of motion;Dry needling;Energy conservation;Splinting;Taping;Vasopneumatic Device    PT Next Visit Plan  FOTO next session    Consulted and Agree with Plan of Care  Patient       Patient will benefit from skilled therapeutic intervention in order to improve the following deficits and impairments:  Decreased scar mobility, Decreased activity tolerance, Decreased strength, Pain, Impaired UE functional use, Improper body mechanics, Decreased range of motion, Impaired flexibility, Postural dysfunction  Visit Diagnosis: Acute pain of right shoulder  Stiffness of right shoulder, not elsewhere classified  Muscle weakness (generalized)  Other symptoms and signs involving the musculoskeletal system     Problem List Patient Active Problem List   Diagnosis Date Noted  . Acute respiratory failure ([place]) 11/10/2017  . Primary localized osteoarthrosis of right shoulder 11/10/2017  . Preoperative cardiovascular examination 10/16/2017  . PAT (paroxysmal atrial tachycardia) ([place]) 06/17/2013  . PAC (premature atrial contraction) 06/17/2013  . Essential hypertension 06/17/2013  . Hyperlipidemia 06/17/2013  . Right shoulder pain 01/20/2012  Janene Harvey, PT, DPT 02/16/18 5:07 PM   [facility] [address]  [address] [place], [place], [address] Phone: [phone]   Fax:  [phone]  Name: Jasmine Swanson MRN: [id number] Date of Birth: 04/15/1941

## 2018-02-17 ENCOUNTER — Ambulatory Visit: Payer: Medicare Other | Admitting: Physical Therapy

## 2018-02-18 ENCOUNTER — Ambulatory Visit: Payer: Medicare Other

## 2018-02-18 DIAGNOSIS — M6281 Muscle weakness (generalized): Secondary | ICD-10-CM | POA: Diagnosis not present

## 2018-02-18 DIAGNOSIS — M25511 Pain in right shoulder: Secondary | ICD-10-CM | POA: Diagnosis not present

## 2018-02-18 DIAGNOSIS — M25611 Stiffness of right shoulder, not elsewhere classified: Secondary | ICD-10-CM

## 2018-02-18 DIAGNOSIS — R29898 Other symptoms and signs involving the musculoskeletal system: Secondary | ICD-10-CM

## 2018-02-18 NOTE — Therapy (Signed)
[facility] [address]  [address] [place], [place], [address] Phone: [phone]   Fax:  [phone]  Physical Therapy Treatment  Patient Details  Name: Jasmine Swanson MRN: [id number] Date of Birth: 04/27/1941 Referring Provider (PT): Joshua Landau, MD   Encounter Date: 02/18/2018  PT End of Session - 02/18/18 1455    Visit Number  15    Number of Visits  18    Date for PT Re-Evaluation  03/16/18    Authorization Type  [employer]    PT Start Time  1452    PT Stop Time  1530    PT Time Calculation (min)  38 min    Activity Tolerance  Patient tolerated treatment well    Behavior During Therapy  W[facility] for tasks assessed/performed       Past Medical History:  Diagnosis Date  . Anxiety   . Arthritis   . Bundle branch block, left   . Complication of anesthesia    laryngeal spams 50 yrs ago  . Depression   . Dysphagia   . Essential hypertension 06/17/2013  . GERD (gastroesophageal reflux disease)   . Hyperlipidemia   . Kyphosis   . PAC (premature atrial contraction) 06/17/2013  . PAT (paroxysmal atrial tachycardia) ([facility]) 06/17/2013  . Plantar fasciitis of right foot   . Preop cardiovascular exam 10/16/2017  . Primary localized osteoarthrosis of right shoulder 11/10/2017  . Right shoulder pain 01/20/2012    Past Surgical History:  Procedure Laterality Date  . ABDOMINAL HYSTERECTOMY    . bladder tac    . CARDIAC CATHETERIZATION    . TONSILLECTOMY    . 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  Subjective Assessment - 02/18/18 1458    Subjective  Jasmine Swanson reporting R shoulder feeling well today.      Pertinent History  R shoulder pain, R plantar fasciitis, PAC and PAT, kyphosis, HLD, GERD, HTN, dysphagia, LBBB, cardiac cath    Diagnostic tests  11/11/17 R shoulder xray: New right shoulder arthroplasty without postop complicating features    Patient Stated Goals  i want to get back to exercising and dancing     Currently in Pain?  No/denies    Pain Score  0-No pain    Multiple Pain Sites  No                       [facility] Adult PT Treatment/Exercise - 02/18/18 1510      Self-Care   Self-Care  --      Shoulder Exercises: Supine   Protraction  Both;20 reps;Weights    Protraction Weight (lbs)  3      Shoulder Exercises: Standing   External Rotation  Right;15 reps;Theraband    Theraband Level (Shoulder External Rotation)  Level 1 (Yellow)    External Rotation Limitations  dowel under elbow for neutral positioning    Internal Rotation  Strengthening;Right;15 reps;Theraband    Theraband Level (Shoulder Internal Rotation)  Level 1 (Yellow)    Internal Rotation Weight (lbs)  dowel under elbow for neutral positioning    Flexion  15 reps    Flexion Limitations  Working on full ROM without excessive scapular elevation     Extension  10 reps;Both;Theraband    Theraband Level (Shoulder Extension)    Level 2 (Red)    Extension Limitations  Cues for full scapular retraction     Row  15 reps;Both;Strengthening;Theraband    Theraband Level (Shoulder Row)  Level 2 (Red)      Shoulder Exercises: ROM/Strengthening   UBE (Upper Arm Bike)  Lvl 1.5, 3 min forw/back      Manual Therapy   Manual Therapy  Passive ROM    Passive ROM  R shoulder PROM in all planes to tolerance with prolonged holds             PT Education - 02/18/18 1654    Education Details  HEP update:  instucted pt. to stop performing all isometric activities, all wand activities, and sidelying ER activity.  Instructed pt. to focus on band strengthenign and latest HEP update for flexion and scaption AROM    Person(s) Educated  Patient    Methods  Explanation;Verbal cues;Handout    Comprehension  Verbalized understanding;Verbal cues  required;Need further instruction       PT Short Term Goals - 02/16/18 1323      PT SHORT TERM GOAL #1   Title  Patient to be independent with initial HEP.    Time  4    Period  Weeks    Status  Achieved        PT Long Term Goals - 02/16/18 1324      PT LONG TERM GOAL #1   Title  Patient to be independent with advanced HEP.    Time  4    Period  Weeks    Status  Partially Met   met for current   Target Date  03/16/18      PT LONG TERM GOAL #2   Title  Patient to demonstrate [facility] R shoulder AROM/PROM.    Time  4    Period  Weeks    Status  On-going   improvements noted in R shoulder flexion, ABD, ER AROM and flexion, ABD, IR PROM   Target Date  03/16/18      PT LONG TERM GOAL #3   Title  Patient to demonstrate >=4+/5 R shoulder strength.    Time  4    Period  Weeks    Status  On-going   strength testing unchanged    Target Date  03/16/18      PT LONG TERM GOAL #4   Title  Patient to report tolerance of putting on bra with <=2/10 pain.    Time  8    Period  Weeks    Status  Achieved   notes no pain or difficulty with this activity     PT LONG TERM GOAL #5   Title  Patient to demonstrate overhead reaching of R UE to retrieve item from overhead shelf.    Time  4    Period  Weeks    Status  Partially Met   able to reach overhead with mild R shoulder elevation   Target Date  03/16/18            Plan - 02/18/18 1459    Clinical Impression Statement  Jasmine Swanson doing well today.  Able to progress to red TB resistance with R shoulder IR, rows, and extension today without issue.  Initiated behind back towel stretch for IR with good tolerance.  Pt. ended visit pain free.  Will continue to progress toward goals.      PT Treatment/Interventions  ADLs/Self Care Home Management;Cryotherapy;Moist Heat;Therapeutic activities;Therapeutic exercise;Manual techniques;Patient/family education;Scar mobilization;Passive range   of motion;Dry needling;Energy  conservation;Splinting;Taping;Vasopneumatic Device    Consulted and Agree with Plan of Care  Patient       Patient will benefit from skilled therapeutic intervention in order to improve the following deficits and impairments:  Decreased scar mobility, Decreased activity tolerance, Decreased strength, Pain, Impaired UE functional use, Improper body mechanics, Decreased range of motion, Impaired flexibility, Postural dysfunction  Visit Diagnosis: Acute pain of right shoulder  Stiffness of right shoulder, not elsewhere classified  Muscle weakness (generalized)  Other symptoms and signs involving the musculoskeletal system     Problem List Patient Active Problem List   Diagnosis Date Noted  . Acute respiratory failure ([place]) 11/10/2017  . Primary localized osteoarthrosis of right shoulder 11/10/2017  . Preoperative cardiovascular examination 10/16/2017  . PAT (paroxysmal atrial tachycardia) ([place]) 06/17/2013  . PAC (premature atrial contraction) 06/17/2013  . Essential hypertension 06/17/2013  . Hyperlipidemia 06/17/2013  . Right shoulder pain 01/20/2012    Bess Harvest, PTA 02/18/18 4:56 PM   [facility] [address]  [place], [place], [address] Phone: [phone]   Fax:  [phone]  Name: Jasmine Swanson MRN: [id number] Date of Birth: [birth year]

## 2018-02-24 ENCOUNTER — Ambulatory Visit: Payer: Medicare Other | Attending: Orthopedic Surgery

## 2018-02-24 DIAGNOSIS — M25511 Pain in right shoulder: Secondary | ICD-10-CM | POA: Insufficient documentation

## 2018-02-24 DIAGNOSIS — M6281 Muscle weakness (generalized): Secondary | ICD-10-CM | POA: Insufficient documentation

## 2018-02-24 DIAGNOSIS — R29898 Other symptoms and signs involving the musculoskeletal system: Secondary | ICD-10-CM | POA: Diagnosis not present

## 2018-02-24 DIAGNOSIS — M25611 Stiffness of right shoulder, not elsewhere classified: Secondary | ICD-10-CM

## 2018-02-24 NOTE — Therapy (Signed)
[facility] [address]  [place], [place], [address] Phone: [phone]   Fax:  [phone]  Physical Therapy Treatment  Patient Details  Name: Jasmine Swanson MRN: [id number] Date of Birth: [birth year] Referring Provider (PT): Marchia Bond, MD   Encounter Date: 02/24/2018  PT End of Session - 02/24/18 1317    Visit Number  16    Number of Visits  18    Date for PT Re-Evaluation  03/16/18    Authorization Type  [employer]    PT Start Time  1315    PT Stop Time  1401    PT Time Calculation (min)  46 min    Activity Tolerance  Patient tolerated treatment well    Behavior During Therapy  [facility] for tasks assessed/performed       Past Medical History:  Diagnosis Date  . Anxiety   . Arthritis   . Bundle branch block, left   . Complication of anesthesia    laryngeal spams 50 yrs ago  . Depression   . Dysphagia   . Essential hypertension 06/17/2013  . GERD (gastroesophageal reflux disease)   . Hyperlipidemia   . Kyphosis   . PAC (premature atrial contraction) 06/17/2013  . PAT (paroxysmal atrial tachycardia) ([place]) 06/17/2013  . Plantar fasciitis of right foot   . Preop cardiovascular exam 10/16/2017  . Primary localized osteoarthrosis of right shoulder 11/10/2017  . Right shoulder pain 01/20/2012    Past Surgical History:  Procedure Laterality Date  . ABDOMINAL HYSTERECTOMY    . bladder tac    . CARDIAC CATHETERIZATION    . TONSILLECTOMY    . 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  Subjective Assessment - 02/24/18 1317    Subjective  Jasmine Swanson doing well today.    Pertinent History  R shoulder pain, R plantar fasciitis, PAC and PAT, kyphosis, HLD, GERD, HTN, dysphagia, LBBB, cardiac cath    Diagnostic tests  11/11/17 R  shoulder xray: New right shoulder arthroplasty without postop complicating features    Patient Stated Goals  i want to get back to exercising and dancing     Currently in Pain?  No/denies    Pain Score  0-No pain    Multiple Pain Sites  No                       [facility] Adult PT Treatment/Exercise - 02/24/18 1321      Shoulder Exercises: Standing   Flexion  20 reps;Both;Weights    Shoulder Flexion Weight (lbs)  1    Flexion Limitations  Working on full ROM without excessive scapular elevation     ABduction  Both;10 reps    Shoulder ABduction Weight (lbs)  1    ABduction Limitations  scaption       Shoulder Exercises: Pulleys   Flexion  2 minutes    Flexion Limitations  to tolerance    Scaption  2 minutes      Shoulder Exercises: ROM/Strengthening   UBE (Upper Arm Bike)  Lvl 2.0, 3 min forw/back      Shoulder Exercises: Stretch   Corner Stretch  3 reps;30 seconds    Corner Stretch Limitations  mid and high doorway with cues for gentle stretch   difficulty with high  doorway stretch - liked mid stretch    Internal Rotation Stretch  10 seconds   x 10 rpes with towel behind back     Manual Therapy   Manual Therapy  Passive ROM    Soft tissue mobilization  STM to posterior/inferior shoulder and R pec in horizontal end range abduction position for stretch     Passive ROM  R shoulder PROM in all planes to tolerance with prolonged holds; Passive R pec stretch with prolonged holds with STM to R pec major in various positions              PT Education - 02/24/18 1410    Education Details  HEP update    Person(s) Educated  Patient    Methods  Explanation;Demonstration;Verbal cues;Handout    Comprehension  Verbalized understanding;Returned demonstration;Verbal cues required;Need further instruction       PT Short Term Goals - 02/16/18 1323      PT SHORT TERM GOAL #1   Title  Patient to be independent with initial HEP.    Time  4    Period  Weeks    Status   Achieved        PT Long Term Goals - 02/16/18 1324      PT LONG TERM GOAL #1   Title  Patient to be independent with advanced HEP.    Time  4    Period  Weeks    Status  Partially Met   met for current   Target Date  03/16/18      PT LONG TERM GOAL #2   Title  Patient to demonstrate [facility] R shoulder AROM/PROM.    Time  4    Period  Weeks    Status  On-going   improvements noted in R shoulder flexion, ABD, ER AROM and flexion, ABD, IR PROM   Target Date  03/16/18      PT LONG TERM GOAL #3   Title  Patient to demonstrate >=4+/5 R shoulder strength.    Time  4    Period  Weeks    Status  On-going   strength testing unchanged    Target Date  03/16/18      PT LONG TERM GOAL #4   Title  Patient to report tolerance of putting on bra with <=2/10 pain.    Time  8    Period  Weeks    Status  Achieved   notes no pain or difficulty with this activity     PT LONG TERM GOAL #5   Title  Patient to demonstrate overhead reaching of R UE to retrieve item from overhead shelf.    Time  4    Period  Weeks    Status  Partially Met   able to reach overhead with mild R shoulder elevation   Target Date  03/16/18            Plan - 02/24/18 1318    Clinical Impression Statement  Jasmine Swanson doing well today noting, "My arm has felt the most normal that it has ever felt this past week".  Did exhibit tight R pec group and Lats which likely contributes to limited abduction ROM thus addressed this with manual STM, prolonged stretching and updated HEP to reflect this.  With noted improvement in overhead strength with elevation activities in session today.  Is now returning to [facility] [facility] dancing with only occasional R shoulder pain.  Progressing well.      PT Treatment/Interventions  ADLs/Self Care Home Management;Cryotherapy;Moist Heat;Therapeutic activities;Therapeutic exercise;Manual techniques;Patient/family education;Scar mobilization;Passive range of motion;Dry needling;Energy  conservation;Splinting;Taping;Vasopneumatic Device    Consulted and Agree with Plan of Care  Patient       Patient will benefit from skilled therapeutic intervention in order to improve the following deficits and impairments:  Decreased scar mobility, Decreased activity tolerance, Decreased strength, Pain, Impaired UE functional use, Improper body mechanics, Decreased range of motion, Impaired flexibility, Postural dysfunction  Visit Diagnosis: Acute pain of right shoulder  Stiffness of right shoulder, not elsewhere classified  Muscle weakness (generalized)  Other symptoms and signs involving the musculoskeletal system     Problem List Patient Active Problem List   Diagnosis Date Noted  . Acute respiratory failure ([place]) 11/10/2017  . Primary localized osteoarthrosis of right shoulder 11/10/2017  . Preoperative cardiovascular examination 10/16/2017  . PAT (paroxysmal atrial tachycardia) ([place]) 06/17/2013  . PAC (premature atrial contraction) 06/17/2013  . Essential hypertension 06/17/2013  . Hyperlipidemia 06/17/2013  . Right shoulder pain 01/20/2012    Bess Harvest, PTA 02/24/18 2:19 PM  [facility] [address]  [place], [place], [address] Phone: [phone]   Fax:  [phone]  Name: KATELIND PYTEL MRN: [id number] Date of Birth: 1941/01/10

## 2018-03-03 ENCOUNTER — Encounter: Payer: Self-pay | Admitting: Physical Therapy

## 2018-03-03 ENCOUNTER — Ambulatory Visit: Payer: Medicare Other | Admitting: Physical Therapy

## 2018-03-03 DIAGNOSIS — R432 Parageusia: Secondary | ICD-10-CM | POA: Diagnosis not present

## 2018-03-03 DIAGNOSIS — I10 Essential (primary) hypertension: Secondary | ICD-10-CM | POA: Diagnosis not present

## 2018-03-03 DIAGNOSIS — R29898 Other symptoms and signs involving the musculoskeletal system: Secondary | ICD-10-CM | POA: Diagnosis not present

## 2018-03-03 DIAGNOSIS — M25611 Stiffness of right shoulder, not elsewhere classified: Secondary | ICD-10-CM | POA: Diagnosis not present

## 2018-03-03 DIAGNOSIS — K219 Gastro-esophageal reflux disease without esophagitis: Secondary | ICD-10-CM | POA: Diagnosis not present

## 2018-03-03 DIAGNOSIS — M25511 Pain in right shoulder: Secondary | ICD-10-CM

## 2018-03-03 DIAGNOSIS — M6281 Muscle weakness (generalized): Secondary | ICD-10-CM | POA: Diagnosis not present

## 2018-03-03 DIAGNOSIS — E782 Mixed hyperlipidemia: Secondary | ICD-10-CM | POA: Diagnosis not present

## 2018-03-03 NOTE — Therapy (Signed)
[facility] [address]  [place], [place], [address] Phone: [phone]   Fax:  [phone]  Physical Therapy Treatment  Patient Details  Name: Jasmine Swanson MRN: [id number] Date of Birth: [birth year] Referring Provider (PT): Marchia Bond, MD   Encounter Date: 03/03/2018  PT End of Session - 03/03/18 1436    Visit Number  17    Number of Visits  18    Date for PT Re-Evaluation  03/16/18    Authorization Type  [employer]    PT Start Time  1351    PT Stop Time  1435    PT Time Calculation (min)  44 min    Activity Tolerance  Patient tolerated treatment well;Patient limited by pain    Behavior During Therapy  [facility] for tasks assessed/performed       Past Medical History:  Diagnosis Date  . Anxiety   . Arthritis   . Bundle branch block, left   . Complication of anesthesia    laryngeal spams 50 yrs ago  . Depression   . Dysphagia   . Essential hypertension 06/17/2013  . GERD (gastroesophageal reflux disease)   . Hyperlipidemia   . Kyphosis   . PAC (premature atrial contraction) 06/17/2013  . PAT (paroxysmal atrial tachycardia) ([place]) 06/17/2013  . Plantar fasciitis of right foot   . Preop cardiovascular exam 10/16/2017  . Primary localized osteoarthrosis of right shoulder 11/10/2017  . Right shoulder pain 01/20/2012    Past Surgical History:  Procedure Laterality Date  . ABDOMINAL HYSTERECTOMY    . bladder tac    . CARDIAC CATHETERIZATION    . TONSILLECTOMY    . 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  Subjective Assessment - 03/03/18 1352    Subjective  Patient reports she had a bit of a set back d/t back pain after trying to open a window that wouldn't open last week. Had horrible cramping in her back. Danced for the  first time last Wednesday and did fine. Avoided doing much exercise after hurting back because it exaccerbated back pain.     Pertinent History  R shoulder pain, R plantar fasciitis, PAC and PAT, kyphosis, HLD, GERD, HTN, dysphagia, LBBB, cardiac cath    Diagnostic tests  11/11/17 R shoulder xray: New right shoulder arthroplasty without postop complicating features    Patient Stated Goals  i want to get back to exercising and dancing     Currently in Pain?  Yes    Pain Score  8     Pain Location  Back    Pain Orientation  Left;Right;Mid    Pain Descriptors / Indicators  Spasm    Pain Type  Chronic pain                       [facility] Adult PT Treatment/Exercise - 03/03/18 0001      Shoulder Exercises: Supine   Protraction  Both;Weights;15 reps    Protraction Weight (lbs)  3      Shoulder Exercises: Sidelying   External Rotation  Right;AROM;10 reps;Weights    External Rotation Weight (lbs)  10x 2#    External Rotation Limitations  dowel under elbow    ABduction  Right;Strengthening;15 reps    ABduction Limitations  1#; to tolerance  Shoulder Exercises: Standing   External Rotation  Strengthening;Right;10 reps;Theraband;Limitations   pt reporting pain in back; manual assistance to avoid rotati   Theraband Level (Shoulder External Rotation)  Level 2 (Red)    External Rotation Limitations  2x10 dowel under elbow for neutral positioning    Internal Rotation  Strengthening;Right;Theraband;10 reps;Limitations    Theraband Level (Shoulder Internal Rotation)  Level 2 (Red)    Internal Rotation Weight (lbs)  2x10; dowel under elbow for neutral positioning    Extension  Both;Theraband;Strengthening;15 reps;Limitations    Theraband Level (Shoulder Extension)  Level 2 (Red)    Row  15 reps;Both;Strengthening;Theraband    Theraband Level (Shoulder Row)  Level 2 (Red)      Shoulder Exercises: Pulleys   Flexion  3 minutes    Flexion Limitations  to tolerance    Scaption  3  minutes    Scaption Limitations  to tolerance      Shoulder Exercises: Stretch   Corner Stretch  20 seconds;Limitations;4 reps    Corner Stretch Limitations  90/90 pec stretch in door on R 2x; B low pec stretch 2x    Internal Rotation Stretch  10 seconds   5x10" with cues to avoid jerking or pain     Manual Therapy   Manual Therapy  Soft tissue mobilization    Soft tissue mobilization  STM to B thorocolumbar paraspinals, R teres/infraspinatus group, deltoid- tender wuth soft tissue restriction throughout             PT Education - 03/03/18 1436    Education Details  review of HEP and update of TB resistance    Person(s) Educated  Patient    Methods  Explanation;Demonstration;Tactile cues;Verbal cues    Comprehension  Verbalized understanding;Returned demonstration       PT Short Term Goals - 02/16/18 1323      PT SHORT TERM GOAL #1   Title  Patient to be independent with initial HEP.    Time  4    Period  Weeks    Status  Achieved        PT Long Term Goals - 02/16/18 1324      PT LONG TERM GOAL #1   Title  Patient to be independent with advanced HEP.    Time  4    Period  Weeks    Status  Partially Met   met for current   Target Date  03/16/18      PT LONG TERM GOAL #2   Title  Patient to demonstrate [facility] R shoulder AROM/PROM.    Time  4    Period  Weeks    Status  On-going   improvements noted in R shoulder flexion, ABD, ER AROM and flexion, ABD, IR PROM   Target Date  03/16/18      PT LONG TERM GOAL #3   Title  Patient to demonstrate >=4+/5 R shoulder strength.    Time  4    Period  Weeks    Status  On-going   strength testing unchanged    Target Date  03/16/18      PT LONG TERM GOAL #4   Title  Patient to report tolerance of putting on bra with <=2/10 pain.    Time  8    Period  Weeks    Status  Achieved   notes no pain or difficulty with this activity     PT LONG TERM GOAL #5   Title  Patient to demonstrate overhead  reaching of R UE to  retrieve item from overhead shelf.    Time  4    Period  Weeks    Status  Partially Met   able to reach overhead with mild R shoulder elevation   Target Date  03/16/18            Plan - 03/03/18 1437    Clinical Impression Statement  Patient arrived with report of flare up of back pain today after trying to open a window. Reporting that she was limited in HEP compliance d/t this pain. Patient tolerated STM to B thoracolumbar paraspinals, R teres/infraspinatus group, deltoid- tender with soft tissue restriction throughout. Addressed back pain with STM today in order to improve tolerance to treatment as back was limiting factor today. Patient tolerated sidelying ER with 2lbs with good form. Progressed IR/ER with red banded resistance today- patient with c/o back pain which improved after cues to avoid trunk rotation. Updated HEP with new banded resistance and reviewed HEP. Patient progressing well per POC, plan to D/C at next session.     PT Treatment/Interventions  ADLs/Self Care Home Management;Cryotherapy;Moist Heat;Therapeutic activities;Therapeutic exercise;Manual techniques;Patient/family education;Scar mobilization;Passive range of motion;Dry needling;Energy conservation;Splinting;Taping;Vasopneumatic Device    PT Next Visit Plan  DC next session    Consulted and Agree with Plan of Care  Patient       Patient will benefit from skilled therapeutic intervention in order to improve the following deficits and impairments:  Decreased scar mobility, Decreased activity tolerance, Decreased strength, Pain, Impaired UE functional use, Improper body mechanics, Decreased range of motion, Impaired flexibility, Postural dysfunction  Visit Diagnosis: Acute pain of right shoulder  Stiffness of right shoulder, not elsewhere classified  Muscle weakness (generalized)  Other symptoms and signs involving the musculoskeletal system     Problem List Patient Active Problem List   Diagnosis Date  Noted  . Acute respiratory failure ([place]) 11/10/2017  . Primary localized osteoarthrosis of right shoulder 11/10/2017  . Preoperative cardiovascular examination 10/16/2017  . PAT (paroxysmal atrial tachycardia) ([place]) 06/17/2013  . PAC (premature atrial contraction) 06/17/2013  . Essential hypertension 06/17/2013  . Hyperlipidemia 06/17/2013  . Right shoulder pain 01/20/2012    Janene Harvey, PT, DPT 03/03/18 2:45 PM   [facility] [address]  [place], [place], [address] Phone: [phone]   Fax:  [phone]  Name: FORTUNATA BETTY MRN: [id number] Date of Birth: 1940/12/19

## 2018-03-05 DIAGNOSIS — M62830 Muscle spasm of back: Secondary | ICD-10-CM | POA: Diagnosis not present

## 2018-03-05 DIAGNOSIS — E538 Deficiency of other specified B group vitamins: Secondary | ICD-10-CM | POA: Diagnosis not present

## 2018-03-05 DIAGNOSIS — Z0000 Encounter for general adult medical examination without abnormal findings: Secondary | ICD-10-CM | POA: Diagnosis not present

## 2018-03-05 DIAGNOSIS — I10 Essential (primary) hypertension: Secondary | ICD-10-CM | POA: Diagnosis not present

## 2018-03-10 ENCOUNTER — Ambulatory Visit: Payer: Medicare Other | Admitting: Physical Therapy

## 2018-03-10 ENCOUNTER — Encounter: Payer: Self-pay | Admitting: Physical Therapy

## 2018-03-10 DIAGNOSIS — R29898 Other symptoms and signs involving the musculoskeletal system: Secondary | ICD-10-CM

## 2018-03-10 DIAGNOSIS — M6281 Muscle weakness (generalized): Secondary | ICD-10-CM

## 2018-03-10 DIAGNOSIS — M25511 Pain in right shoulder: Secondary | ICD-10-CM

## 2018-03-10 DIAGNOSIS — M25611 Stiffness of right shoulder, not elsewhere classified: Secondary | ICD-10-CM

## 2018-03-10 NOTE — Therapy (Signed)
[facility] [address]  [place], [place], [address] Phone: [phone]   Fax:  [phone]  Physical Therapy Discharge Summary  Patient Details  Name: Jasmine Swanson MRN: [id number] Date of Birth: 20-Jul-1940 Referring Provider (PT): Marchia Bond, MD   Progress Note Reporting Period 01/28/18 to 03/10/18  See note below for Objective Data and Assessment of Progress/Goals.    Encounter Date: 03/10/2018  PT End of Session - 03/10/18 1548    Visit Number  18    Number of Visits  18    Date for PT Re-Evaluation  03/16/18    Authorization Type  [employer]    PT Start Time  1402    PT Stop Time  1446    PT Time Calculation (min)  44 min    Activity Tolerance  Patient tolerated treatment well    Behavior During Therapy  WFL for tasks assessed/performed       Past Medical History:  Diagnosis Date  . Anxiety   . Arthritis   . Bundle branch block, left   . Complication of anesthesia    laryngeal spams 50 yrs ago  . Depression   . Dysphagia   . Essential hypertension 06/17/2013  . GERD (gastroesophageal reflux disease)   . Hyperlipidemia   . Kyphosis   . PAC (premature atrial contraction) 06/17/2013  . PAT (paroxysmal atrial tachycardia) ([place]) 06/17/2013  . Plantar fasciitis of right foot   . Preop cardiovascular exam 10/16/2017  . Primary localized osteoarthrosis of right shoulder 11/10/2017  . Right shoulder pain 01/20/2012    Past Surgical History:  Procedure Laterality Date  . ABDOMINAL HYSTERECTOMY    . bladder tac    . CARDIAC CATHETERIZATION    . TONSILLECTOMY    . 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  Subjective Assessment - 03/10/18 1402    Subjective  Patient reports she slept weird on her shoulder and  it woke her up. Worked in her garden yesterday which made her back act up. Reports 97-98% improvement sinc einitial eval. Notes improvements in pain, overhead reaching, putting on bra.     Pertinent History  R shoulder pain, R plantar fasciitis, PAC and PAT, kyphosis, HLD, GERD, HTN, dysphagia, LBBB, cardiac cath    Diagnostic tests  11/11/17 R shoulder xray: New right shoulder arthroplasty without postop complicating features    Patient Stated Goals  i want to get back to exercising and dancing     Currently in Pain?  Yes    Pain Score  5     Pain Location  Back    Pain Orientation  Right;Left;Mid    Pain Descriptors / Indicators  Spasm    Pain Type  Chronic pain         [facility] PT Assessment - 03/10/18 0001      Observation/Other Assessments   Focus on Therapeutic Outcomes (FOTO)   Shoulder: 62 (38% limited, 38% predicted)      AROM   AROM Assessment Site  Shoulder    Right/Left Shoulder  Right    Right Shoulder Flexion  130 Degrees    Right Shoulder ABduction  97 Degrees    Right Shoulder Internal Rotation  60 Degrees    Right Shoulder External Rotation  70 Degrees  PROM   PROM Assessment Site  Shoulder    Right/Left Shoulder  Right    Right Shoulder Flexion  146 Degrees    Right Shoulder ABduction  161 Degrees    Right Shoulder Internal Rotation  81 Degrees    Right Shoulder External Rotation  44 Degrees      Strength   Strength Assessment Site  Shoulder    Right/Left Shoulder  Right    Right Shoulder Flexion  4+/5    Right Shoulder ABduction  4+/5    Right Shoulder Internal Rotation  4/5    Right Shoulder External Rotation  4-/5                   [facility] Adult PT Treatment/Exercise - 03/10/18 0001      Shoulder Exercises: Supine   Protraction  Both;15 reps;Theraband;Weights;Limitations    Protraction Weight (lbs)  4    Protraction Limitations  1st set with #, 2nd set with red TB      Shoulder Exercises: Sidelying   External Rotation  Right;AROM;10  reps;Weights    External Rotation Weight (lbs)  10x 2#    External Rotation Limitations  dowel under elbow    ABduction  Right;Strengthening;10 reps    ABduction Limitations  2#; to tolerance      Shoulder Exercises: Pulleys   Flexion  3 minutes    Flexion Limitations  to tolerance    Scaption  3 minutes    Scaption Limitations  to tolerance      Shoulder Exercises: Stretch   Corner Stretch  30 seconds    Corner Stretch Limitations  B UE low, mid, high pec stretch to tolerance    Internal Rotation Stretch  10 seconds   L UE 5x10" to tolerance with strap     Manual Therapy   Manual Therapy  Passive ROM    Passive ROM  R shoulder PROM in all planes to tolerance with prolonged holds             PT Education - 03/10/18 1548    Education Details  review and consolidation of HEP; discussion of return to pre-op gym routine    Person(s) Educated  Patient    Methods  Explanation;Demonstration;Tactile cues;Verbal cues;Handout    Comprehension  Verbalized understanding;Returned demonstration       PT Short Term Goals - 02/16/18 1323      PT SHORT TERM GOAL #1   Title  Patient to be independent with initial HEP.    Time  4    Period  Weeks    Status  Achieved        PT Long Term Goals - 03/10/18 1549      PT LONG TERM GOAL #1   Title  Patient to be independent with advanced HEP.    Time  4    Period  Weeks    Status  Achieved      PT LONG TERM GOAL #2   Title  Patient to demonstrate [facility] R shoulder AROM/PROM.    Time  4    Period  Weeks    Status  Achieved   improvements demonstrated in all planes of R shoulder AROM and flexion and abduction PROM. Patient reports no functional limitations as a result of shoulder ROM     PT LONG TERM GOAL #3   Title  Patient to demonstrate >=4+/5 R shoulder strength.    Time  4    Period  Weeks  Status  Partially Met   improvements demonstrated in all planes of MMT; most limited in ER     PT LONG TERM GOAL #4   Title   Patient to report tolerance of putting on bra with <=2/10 pain.    Time  8    Period  Weeks    Status  Achieved   notes no pain or difficulty with this activity     PT LONG TERM GOAL #5   Title  Patient to demonstrate overhead reaching of R UE to retrieve item from overhead shelf.    Time  4    Period  Weeks    Status  Achieved   able to reach overhead with mild R shoulder elevation           Plan - 03/10/18 1551    Clinical Impression Statement  Patient arriving today with report of 97-98% improvement in R shoulder since initial eval. Patient very happy with her progress and notes improvements in ROM, pain levels, and ADLs such as dressing and driving. Improvements demonstrated in all planes of R shoulder AROM, and flexion and abduction PROM. Patient reports no functional limitations as a result of shoulder ROM at this time. Improvements demonstrated in all planes of MMT; most limited in ER. Patient able to don/doff bra without issues and reach to overhead cabinet. Patient tolerated PROM with prolonged holds and no c/o pain. Tolerated UE stretching and RTC strengthening with good tolerance of increased weighted resistance for sidelying ER and abduction. Spend time for review and consolidation of HEP and discussion of return to pre-op gym routine. Advised patient to stay within lifting limit defined by MD. Patient reported understanding. Patient to be D/C'd at this time d/t meeting or partially meeting all goals and satisfaction with CLOF.     PT Treatment/Interventions  ADLs/Self Care Home Management;Cryotherapy;Moist Heat;Therapeutic activities;Therapeutic exercise;Manual techniques;Patient/family education;Scar mobilization;Passive range of motion;Dry needling;Energy conservation;Splinting;Taping;Vasopneumatic Device    PT Next Visit Plan  D/C at this time    Consulted and Agree with Plan of Care  Patient       Patient will benefit from skilled therapeutic intervention in order to  improve the following deficits and impairments:  Decreased scar mobility, Decreased activity tolerance, Decreased strength, Pain, Impaired UE functional use, Improper body mechanics, Decreased range of motion, Impaired flexibility, Postural dysfunction  Visit Diagnosis: Acute pain of right shoulder  Stiffness of right shoulder, not elsewhere classified  Muscle weakness (generalized)  Other symptoms and signs involving the musculoskeletal system     Problem List Patient Active Problem List   Diagnosis Date Noted  . Acute respiratory failure ([facility]) 11/10/2017  . Primary localized osteoarthrosis of right shoulder 11/10/2017  . Preoperative cardiovascular examination 10/16/2017  . PAT (paroxysmal atrial tachycardia) ([facility]) 06/17/2013  . PAC (premature atrial contraction) 06/17/2013  . Essential hypertension 06/17/2013  . Hyperlipidemia 06/17/2013  . Right shoulder pain 01/20/2012    PHYSICAL THERAPY DISCHARGE SUMMARY  Visits from Start of Care: 18  Current functional level related to goals / functional outcomes: See above clinical impression    Remaining deficits: Decreased strength   Education / Equipment: HEP  Plan: Patient agrees to discharge.  Patient goals were partially met. Patient is being discharged due to meeting the stated rehab goals.  ?????     Janene Harvey, PT, DPT 03/10/18 4:00 PM   [facility] [address]  [place], [place], [address] Phone: [phone]   Fax:  [phone]  Name: JAINE ESTABROOKS MRN: [id number] Date of Birth: 1941-01-21

## 2018-03-24 ENCOUNTER — Encounter: Payer: Self-pay | Admitting: Physical Therapy

## 2018-03-24 ENCOUNTER — Ambulatory Visit: Payer: Medicare Other | Attending: Family Medicine | Admitting: Physical Therapy

## 2018-03-24 ENCOUNTER — Other Ambulatory Visit: Payer: Self-pay

## 2018-03-24 DIAGNOSIS — R293 Abnormal posture: Secondary | ICD-10-CM | POA: Insufficient documentation

## 2018-03-24 DIAGNOSIS — M546 Pain in thoracic spine: Secondary | ICD-10-CM | POA: Diagnosis not present

## 2018-03-24 DIAGNOSIS — R29898 Other symptoms and signs involving the musculoskeletal system: Secondary | ICD-10-CM | POA: Diagnosis not present

## 2018-03-24 NOTE — Therapy (Signed)
[facility] [address]  [place], [place], [address] Phone: [phone]   Fax:  [phone]  Physical Therapy Evaluation  Patient Details  Name: Jasmine Swanson MRN: [id number] Date of Birth: [birth year] Referring Provider (PT): Theadore Nan, MD   Encounter Date: 03/24/2018  PT End of Session - 03/24/18 1759    Visit Number  1    Number of Visits  13    Date for PT Re-Evaluation  05/05/18    Authorization Type  [employer]    PT Start Time  1359    PT Stop Time  8563    PT Time Calculation (min)  50 min    Activity Tolerance  Patient tolerated treatment well;Patient limited by pain    Behavior During Therapy  [facility] for tasks assessed/performed       Past Medical History:  Diagnosis Date  . Anxiety   . Arthritis   . Bundle branch block, left   . Complication of anesthesia    laryngeal spams 50 yrs ago  . Depression   . Dysphagia   . Essential hypertension 06/17/2013  . GERD (gastroesophageal reflux disease)   . Hyperlipidemia   . Kyphosis   . PAC (premature atrial contraction) 06/17/2013  . PAT (paroxysmal atrial tachycardia) ([facility]) 06/17/2013  . Plantar fasciitis of right foot   . Preop cardiovascular exam 10/16/2017  . Primary localized osteoarthrosis of right shoulder 11/10/2017  . Right shoulder pain 01/20/2012    Past Surgical History:  Procedure Laterality Date  . ABDOMINAL HYSTERECTOMY    . bladder tac    . CARDIAC CATHETERIZATION    . TONSILLECTOMY    . 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   Subjective Assessment - 03/24/18 1401    Subjective  Patient reports chronic thoracic back pain since 1995 when she started cycling. Since then the pain has gotten progressively worse. Report she has been "sway backed"  or lordotic all her life. Pain occurs in bouts of muscle spams which causes severe pain, increased from baseline level of aching pain. Pain is localized to B thoracic paraspinals. Reports difficulty with lifting things from the ground, walking, stairs, worse with extension. Denies N/T or radiation.    Pertinent History  R shoulder pain, R plantar fasciitis, PAC and PAT, kyphosis, HLD, GERD, HTN, dysphagia, LBBB, cardiac cath    Limitations  Sitting;Lifting;Standing;Walking;House hold activities    How long can you sit comfortably?  5 min    How long can you stand comfortably?  15-20 min    How long can you walk comfortably?  15-20 min    Diagnostic tests  none recent    Patient Stated Goals  I want to get back to dancing    Currently in Pain?  Yes    Pain Score  3     Pain Location  Back    Pain Orientation  Mid;Left;Right    Pain Descriptors / Indicators  Dull;Aching    Pain Type  Chronic pain    Aggravating Factors   bending back    Pain Relieving Factors  heat         [facility] PT Assessment - 03/24/18 1417      Assessment   Medical Diagnosis  Spasm of thoracic back muscle  Referring Provider (PT)  Theadore Nan, MD    Onset Date/Surgical Date  --   chronic, since 1995   Next MD Visit  --   pt unsure- sometime in march   Prior Therapy  Yes- for R TSA      Precautions   Precautions  --   R TSA     Restrictions   Weight Bearing Restrictions  No      Balance Screen   Has the patient fallen in the past 6 months  No    Has the patient had a decrease in activity level because of a fear of falling?   No    Is the patient reluctant to leave their home because of a fear of falling?   No      Home Environment   Living Environment  Private residence    Living Arrangements  Alone    Available Help at Discharge  Family;Friend(s)    Type of [place] to enter    Entrance Stairs-Number of Steps  [place]  One level       Prior Function   Level of [place]  Retired    Leisure  gym, Paediatric nurse dancing      Cognition   Overall Cognitive Status  Within Functional Limits for tasks assessed      Observation/Other Assessments   Focus on Therapeutic Outcomes (FOTO)   Thoracic spine: 43 (57% limited, 48% predicted)      Sensation   Light Touch  Appears Intact      Coordination   Gross Motor Movements are Fluid and Coordinated  Yes      Posture/Postural Control   Posture/Postural Control  Postural limitations    Postural Limitations  Rounded Shoulders;Forward head;Increased thoracic kyphosis      ROM / Strength   AROM / PROM / Strength  AROM;Strength      AROM   AROM Assessment Site  Thoracic;Lumbar    Lumbar Flexion  toes- however heavy hip flexion focus    Lumbar Extension  severely limited   7/10 pain   Lumbar - Right Side Bend  distal thigh   7/10 pain   Lumbar - Left Side Bend  distal thigh   7/10 pain   Lumbar - Right Rotation  mildly limited   3/10 pain   Lumbar - Left Rotation  mildly limited   3/10 pain   Thoracic Flexion  mildly limited   position of comfort   Thoracic Extension  moderately limited   5/10 pain in thoracic spine and parapsinals   Thoracic - Right Side Bend  mildly limited   5/10 pain   Thoracic - Left Side Bend  mildly limited   5/10 pain   Thoracic - Right Rotation  moderately limited   5/10   Thoracic - Left Rotation  moderately limited   5/10     Strength   Strength Assessment Site  Shoulder;Hip;Knee;Ankle    Right/Left Shoulder  Right;Left    Right Shoulder Flexion  4+/5    Right Shoulder ABduction  4+/5   pain in back   Right Shoulder Internal Rotation  4/5    Right Shoulder External Rotation  4-/5    Left Shoulder Flexion  4+/5    Left Shoulder ABduction  4+/5   pain in back   Left Shoulder Internal Rotation  4+/5    Left Shoulder External Rotation  4/5    Right/Left Hip  Right;Left    Right Hip Flexion  4/5    Right  Hip ABduction  4/5    Right Hip ADduction  4/5    Left Hip Flexion  4/5    Left Hip ABduction  4/5    Left Hip ADduction  4/5    Right/Left Knee  Right;Left    Right Knee Flexion  4/5    Right Knee Extension  4+/5    Left Knee Flexion  4/5    Left Knee Extension  4+/5    Right/Left Ankle  Right;Left    Right Ankle Dorsiflexion  4+/5    Right Ankle Plantar Flexion  4+/5    Left Ankle Dorsiflexion  4+/5    Left Ankle Plantar Flexion  4+/5      Palpation   Palpation comment  significant increase in tone in thoracic paraspinals and QL; TTP throughout       Bed Mobility   Bed Mobility  --   difficulty with rolling and turning d/t pain     Ambulation/Gait   Gait Pattern  Within Functional Limits                Objective measurements completed on examination: See above findings.              PT Education - 03/24/18 1758    Education Details  prognosis, POC, HEP, edu on self-STM with ball on wall technique along B thoracic paraspinals    Person(s) Educated  Patient    Methods  Explanation;Demonstration;Tactile cues;Verbal cues;Handout    Comprehension  Verbalized understanding;Returned demonstration       PT Short Term Goals - 03/24/18 1811      PT SHORT TERM GOAL #1   Title  Patient to be independent with initial HEP.    Time  3    Period  Weeks    Status  New    Target Date  04/14/18        PT Long Term Goals - 03/24/18 1811      PT LONG TERM GOAL #1   Title  Patient to be independent with advanced HEP.    Time  6    Period  Weeks    Status  New    Target Date  05/05/18      PT LONG TERM GOAL #2   Title  Patient to demonstrate [facility] thoracic and lumbar AROM without pain limiting.     Time  6    Period  Weeks    Status  New    Target Date  05/05/18      PT LONG TERM GOAL #3   Title  Patient to demonstrate and recall safe lifting mechanics to lift 10lb box from ground.     Time  6    Period  Weeks    Status  New    Target Date  05/05/18       PT LONG TERM GOAL #4   Title  Patient to return to shag dancing without c/o pain.    Time  6    Period  Weeks    Status  New    Target Date  05/05/18      PT LONG TERM GOAL #5   Title  Patient to report tolerance of 1 hour of walking without pain limiting.     Time  6    Period  Weeks    Status  New    Target Date  05/05/18             Plan - 03/24/18 1803    Clinical Impression Statement  Patient is a 77y/o F presenting to OPPT with c/o chronic thoracic back pain with recent progressive worsening of pain and muscle spasms. Pain is localized to B thoracic paraspinals and occurs in bouts of muscle spams which cause severe pain, increased from baseline level of aching pain. Report shaving difficulty with lifting things from the ground, walking, stairs, worse with extension. Denies N/T or radiation. Patient today with limited and painful thoracic an lumbar AROM, pain with shoulder strength testing, significant increased tone and tenderness in B thoracic paraspinals and QL. Educated on and received handout for gentle stretching and ROM HEP. Patient reported understanding. Would benefit from skilled PT services 2x/week for 6 weeks to address aforementioned impairments.     Rehab Potential  Good    PT Frequency  2x / week    PT Duration  6 weeks    PT Treatment/Interventions  Manual techniques;Patient/family education;Passive range of motion;Dry needling;Energy conservation;Splinting;Taping;ADLs/Self Care Home Management;Cryotherapy;Electrical Stimulation;Moist Heat;Ultrasound;DME Instruction;Gait training;Stair training;Functional mobility training;Therapeutic activities;Therapeutic exercise;Neuromuscular re-education;Balance training    PT Next Visit Plan  reassess HEP    Consulted and Agree with Plan of Care  Patient       Patient will benefit from skilled therapeutic intervention in order to improve the following deficits and impairments:  Impaired tone, Decreased activity  tolerance, Decreased strength, Pain, Difficulty walking, Increased muscle spasms, Improper body mechanics, Decreased range of motion, Impaired flexibility, Postural dysfunction  Visit Diagnosis: Pain in thoracic spine  Other symptoms and signs involving the musculoskeletal system  Abnormal posture     Problem List Patient Active Problem List   Diagnosis Date Noted  . Acute respiratory failure ([facility]) 11/10/2017  . Primary localized osteoarthrosis of right shoulder 11/10/2017  . Preoperative cardiovascular examination 10/16/2017  . PAT (paroxysmal atrial tachycardia) ([facility]) 06/17/2013  . PAC (premature atrial contraction) 06/17/2013  . Essential hypertension 06/17/2013  . Hyperlipidemia 06/17/2013  . Right shoulder pain 01/20/2012    Janene Harvey, PT, DPT 03/24/18 [facility] [address]  [place], [place], [address] Phone: [phone]   Fax:  [phone]  Name: SUTTON PLAKE MRN: [id number] Date of Birth: Mar 15, 1941

## 2018-03-30 ENCOUNTER — Ambulatory Visit
Admission: RE | Admit: 2018-03-30 | Discharge: 2018-03-30 | Disposition: A | Discharge status: Home / Self Care | Payer: Medicare Other | Source: Ambulatory Visit | Attending: Family Medicine | Admitting: Family Medicine

## 2018-03-30 DIAGNOSIS — Z1231 Encounter for screening mammogram for malignant neoplasm of breast: Secondary | ICD-10-CM

## 2018-04-02 ENCOUNTER — Encounter

## 2018-04-02 ENCOUNTER — Ambulatory Visit: Payer: Medicare Other | Attending: Family Medicine

## 2018-04-02 DIAGNOSIS — R29898 Other symptoms and signs involving the musculoskeletal system: Secondary | ICD-10-CM | POA: Diagnosis not present

## 2018-04-02 DIAGNOSIS — R293 Abnormal posture: Secondary | ICD-10-CM | POA: Diagnosis not present

## 2018-04-02 DIAGNOSIS — M546 Pain in thoracic spine: Secondary | ICD-10-CM | POA: Diagnosis not present

## 2018-04-02 NOTE — Therapy (Signed)
[facility] [address]  [place], [place], [address] Phone: [phone]   Fax:  [phone]  Physical Therapy Treatment  Patient Details  Name: Jasmine Swanson MRN: [id number] Date of Birth: [birth year] Referring Provider (PT): Theadore Nan, MD   Encounter Date: 04/02/2018  PT End of Session - 04/02/18 1104    Visit Number  2    Number of Visits  13    Date for PT Re-Evaluation  05/05/18    Authorization Type  [employer]    PT Start Time  1100    PT Stop Time  1148   ended visit with 10 min moist heat   PT Time Calculation (min)  48 min    Activity Tolerance  Patient tolerated treatment well;Patient limited by pain    Behavior During Therapy  [facility] for tasks assessed/performed       Past Medical History:  Diagnosis Date  . Anxiety   . Arthritis   . Bundle branch block, left   . Complication of anesthesia    laryngeal spams 50 yrs ago  . Depression   . Dysphagia   . Essential hypertension 06/17/2013  . GERD (gastroesophageal reflux disease)   . Hyperlipidemia   . Kyphosis   . PAC (premature atrial contraction) 06/17/2013  . PAT (paroxysmal atrial tachycardia) ([place]) 06/17/2013  . Plantar fasciitis of right foot   . Preop cardiovascular exam 10/16/2017  . Primary localized osteoarthrosis of right shoulder 11/10/2017  . Right shoulder pain 01/20/2012    Past Surgical History:  Procedure Laterality Date  . ABDOMINAL HYSTERECTOMY    . bladder tac    . CARDIAC CATHETERIZATION    . TONSILLECTOMY    . 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  Subjective Assessment - 04/02/18 1103    Subjective  Pt. noting no recent spasms.  Was having back spasms daily before therapy for back started.      Pertinent History  R shoulder pain,  R plantar fasciitis, PAC and PAT, kyphosis, HLD, GERD, HTN, dysphagia, LBBB, cardiac cath    Patient Stated Goals  I want to get back to dancing    Currently in Pain?  No/denies    Pain Score  0-No pain    Multiple Pain Sites  No                       [facility] Adult PT Treatment/Exercise - 04/02/18 1104      Lumbar Exercises: Stretches   Single Knee to Chest Stretch  Right;Left;2 reps;30 seconds    Single Knee to Chest Stretch Limitations  B     Piriformis Stretch  Right;Left;1 rep;30 seconds    Piriformis Stretch Limitations  KTOS       Lumbar Exercises: Aerobic   Nustep  Lvl 3, 6 min       Lumbar Exercises: Supine   Clam  10 reps;3 seconds    Clam Limitations  red looped TB at knees     Bent Knee Raise  10 reps;3 seconds    Bent Knee Raise Limitations  red looped TB     Bridge  10 reps;1 second    Bridge Limitations  well tolerated       Lumbar Exercises: Sidelying   Other Sidelying  Lumbar Exercises  B "open book" stretch x 10 reps 5" hold      Lumbar Exercises: Quadruped   Mad[place]/Old Horse  10 reps    Madcat/Old Horse Limitations  Cues required for proper motion       Moist Heat Therapy   Number Minutes Moist Heat  10 Minutes    Moist Heat Location  Lumbar Spine               PT Short Term Goals - 04/02/18 1107      PT SHORT TERM GOAL #1   Title  Patient to be independent with initial HEP.    Time  3    Period  Weeks    Status  On-going        PT Long Term Goals - 04/02/18 1107      PT LONG TERM GOAL #1   Title  Patient to be independent with advanced HEP.    Time  6    Period  Weeks    Status  On-going      PT LONG TERM GOAL #2   Title  Patient to demonstrate [facility] thoracic and lumbar AROM without pain limiting.     Time  6    Period  Weeks    Status  On-going      PT LONG TERM GOAL #3   Title  Patient to demonstrate and recall safe lifting mechanics to lift 10lb box from ground.     Time  6    Period  Weeks    Status   On-going      PT LONG TERM GOAL #4   Title  Patient to return to shag dancing without c/o pain.    Time  6    Period  Weeks    Status  On-going      PT LONG TERM GOAL #5   Title  Patient to report tolerance of 1 hour of walking without pain limiting.     Time  6    Period  Weeks    Status  On-going            Plan - 04/02/18 1106    Clinical Impression Statement  Pt. noting she has not had recent back spasms.  Notes she tolerated HEP well and only requiring min cueing with quadruped "cat/camel" activity today for proper technique.  Did require cueing today for pt. to stay on task however pt. tolerated all gentle lumbopelvic stabilization and ROM activities well without spasm or pain.  Ended visit with 10 min moist heat to lumbar spine to promote reduction in tone and improved comfort.  Will continue to progress toward goals.      PT Treatment/Interventions  Manual techniques;Patient/family education;Passive range of motion;Dry needling;Energy conservation;Splinting;Taping;ADLs/Self Care Home Management;Cryotherapy;Electrical Stimulation;Moist Heat;Ultrasound;DME Instruction;Gait training;Stair training;Functional mobility training;Therapeutic activities;Therapeutic exercise;Neuromuscular re-education;Balance training    Consulted and Agree with Plan of Care  Patient       Patient will benefit from skilled therapeutic intervention in order to improve the following deficits and impairments:  Impaired tone, Decreased activity tolerance, Decreased strength, Pain, Difficulty walking, Increased muscle spasms, Improper body mechanics, Decreased range of motion, Impaired flexibility, Postural dysfunction  Visit Diagnosis: Pain in thoracic spine  Other symptoms and signs involving the musculoskeletal system  Abnormal posture     Problem List Patient Active Problem List   Diagnosis Date Noted  . Acute respiratory failure ([place]) 11/10/2017  . Primary localized osteoarthrosis of right  shoulder 11/10/2017  .  Preoperative cardiovascular examination 10/16/2017  . PAT (paroxysmal atrial tachycardia) ([place]) 06/17/2013  . PAC (premature atrial contraction) 06/17/2013  . Essential hypertension 06/17/2013  . Hyperlipidemia 06/17/2013  . Right shoulder pain 01/20/2012    Bess Harvest, PTA 04/02/18 11:45 AM   [facility] [address]  [place], [place], [address] Phone: [phone]   Fax:  [phone]  Name: EURYDICE CALIXTO MRN: [id number] Date of Birth: [birth year]

## 2018-04-06 ENCOUNTER — Ambulatory Visit: Payer: Medicare Other

## 2018-04-06 DIAGNOSIS — M546 Pain in thoracic spine: Secondary | ICD-10-CM

## 2018-04-06 DIAGNOSIS — R29898 Other symptoms and signs involving the musculoskeletal system: Secondary | ICD-10-CM

## 2018-04-06 DIAGNOSIS — R293 Abnormal posture: Secondary | ICD-10-CM

## 2018-04-06 NOTE — Therapy (Signed)
[facility] [address]  [place], [place], [address] Phone: [phone]   Fax:  [phone]  Physical Therapy Treatment  Patient Details  Name: Jasmine Swanson MRN: [id number] Date of Birth: 30-Jul-1940 Referring Provider (PT): Theadore Nan, MD   Encounter Date: 04/06/2018  PT End of Session - 04/06/18 1409    Visit Number  3    Number of Visits  13    Date for PT Re-Evaluation  05/05/18    Authorization Type  [employer]    PT Start Time  1400    PT Stop Time  1445    PT Time Calculation (min)  45 min    Activity Tolerance  Patient tolerated treatment well;Patient limited by pain    Behavior During Therapy  [facility] for tasks assessed/performed       Past Medical History:  Diagnosis Date  . Anxiety   . Arthritis   . Bundle branch block, left   . Complication of anesthesia    laryngeal spams 50 yrs ago  . Depression   . Dysphagia   . Essential hypertension 06/17/2013  . GERD (gastroesophageal reflux disease)   . Hyperlipidemia   . Kyphosis   . PAC (premature atrial contraction) 06/17/2013  . PAT (paroxysmal atrial tachycardia) ([place]) 06/17/2013  . Plantar fasciitis of right foot   . Preop cardiovascular exam 10/16/2017  . Primary localized osteoarthrosis of right shoulder 11/10/2017  . Right shoulder pain 01/20/2012    Past Surgical History:  Procedure Laterality Date  . ABDOMINAL HYSTERECTOMY    . bladder tac    . CARDIAC CATHETERIZATION    . TONSILLECTOMY    . 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  Subjective Assessment - 04/06/18 1409    Subjective  Pt. reporting she had a back spasm after prolonged standing with kitchen work.      Pertinent History  R shoulder pain, R plantar fasciitis, PAC and PAT, kyphosis, HLD,  GERD, HTN, dysphagia, LBBB, cardiac cath    Diagnostic tests  none recent    Patient Stated Goals  I want to get back to dancing    Currently in Pain?  No/denies    Pain Score  0-No pain    Multiple Pain Sites  No                       [facility] Adult PT Treatment/Exercise - 04/06/18 1412      Self-Care   Self-Care  Other Self-Care Comments    Other Self-Care Comments   Instructed pt. in proper body mechanics with household tasks with reference to body mechanics handout       Lumbar Exercises: Stretches   Single Knee to Chest Stretch  Right;Left;2 reps;30 seconds    Single Knee to Chest Stretch Limitations  B     Lower Trunk Rotation  --   5" x 10 reps      Lumbar Exercises: Aerobic   Nustep  Lvl 3, 6 min (UE/LE)      Lumbar Exercises: Standing   Functional Squats  10 reps;3 seconds    Functional Squats Limitations  counter       Lumbar Exercises: Supine   Clam  15 reps;3 seconds    Clam Limitations  red looped  TB at knees     Bridge  3 seconds   x 12 reps    Bridge Limitations  well tolerated       Lumbar Exercises: Sidelying   Clam  Right;Left;10 reps    Clam Limitations  B with yellow TB     Other Sidelying Lumbar Exercises  B "open book" stretch x 10 reps 5" hold               PT Short Term Goals - 04/06/18 1436      PT SHORT TERM GOAL #1   Title  Patient to be independent with initial HEP.    Time  3    Period  Weeks    Status  Achieved        PT Long Term Goals - 04/02/18 1107      PT LONG TERM GOAL #1   Title  Patient to be independent with advanced HEP.    Time  6    Period  Weeks    Status  On-going      PT LONG TERM GOAL #2   Title  Patient to demonstrate [facility] thoracic and lumbar AROM without pain limiting.     Time  6    Period  Weeks    Status  On-going      PT LONG TERM GOAL #3   Title  Patient to demonstrate and recall safe lifting mechanics to lift 10lb box from ground.     Time  6    Period  Weeks    Status   On-going      PT LONG TERM GOAL #4   Title  Patient to return to shag dancing without c/o pain.    Time  6    Period  Weeks    Status  On-going      PT LONG TERM GOAL #5   Title  Patient to report tolerance of 1 hour of walking without pain limiting.     Time  6    Period  Weeks    Status  On-going            Plan - 04/06/18 1410    Clinical Impression Statement  Jasmine Swanson doing well today with no new complaints.  Reviewed posture and body mechanics handout with pt. as to reduce lumbar strain with daily chores with pt. verbalizing understanding.  Pt. tolerated all lumbopelvic strengthening and ROM activities well today.  Feels she is improving with therapy noting less frequent back spasms at this point.  Will continue to progress toward goals.        PT Treatment/Interventions  Manual techniques;Patient/family education;Passive range of motion;Dry needling;Energy conservation;Splinting;Taping;ADLs/Self Care Home Management;Cryotherapy;Electrical Stimulation;Moist Heat;Ultrasound;DME Instruction;Gait training;Stair training;Functional mobility training;Therapeutic activities;Therapeutic exercise;Neuromuscular re-education;Balance training    Consulted and Agree with Plan of Care  Patient       Patient will benefit from skilled therapeutic intervention in order to improve the following deficits and impairments:  Impaired tone, Decreased activity tolerance, Decreased strength, Pain, Difficulty walking, Increased muscle spasms, Improper body mechanics, Decreased range of motion, Impaired flexibility, Postural dysfunction  Visit Diagnosis: Pain in thoracic spine  Other symptoms and signs involving the musculoskeletal system  Abnormal posture     Problem List Patient Active Problem List   Diagnosis Date Noted  . Acute respiratory failure ([place]) 11/10/2017  . Primary localized osteoarthrosis of right shoulder 11/10/2017  . Preoperative cardiovascular examination 10/16/2017  . PAT  (paroxysmal atrial tachycardia) ([place]) 06/17/2013  .  PAC (premature atrial contraction) 06/17/2013  . Essential hypertension 06/17/2013  . Hyperlipidemia 06/17/2013  . Right shoulder pain 01/20/2012    Bess Harvest, PTA 04/07/18 8:04 AM   [facility] [address]  [place], [place], [address] Phone: [phone]   Fax:  [phone]  Name: Jasmine Swanson MRN: [id number] Date of Birth: 20-May-1941

## 2018-04-08 ENCOUNTER — Encounter: Payer: Self-pay | Admitting: Physical Therapy

## 2018-04-08 ENCOUNTER — Ambulatory Visit: Payer: Medicare Other | Admitting: Physical Therapy

## 2018-04-08 DIAGNOSIS — R29898 Other symptoms and signs involving the musculoskeletal system: Secondary | ICD-10-CM | POA: Diagnosis not present

## 2018-04-08 DIAGNOSIS — M546 Pain in thoracic spine: Secondary | ICD-10-CM | POA: Diagnosis not present

## 2018-04-08 DIAGNOSIS — R293 Abnormal posture: Secondary | ICD-10-CM | POA: Diagnosis not present

## 2018-04-08 NOTE — Therapy (Signed)
[facility] [address]  [place], [place], [address] Phone: [phone]   Fax:  [phone]  Physical Therapy Treatment  Patient Details  Name: Jasmine Swanson MRN: [id number] Date of Birth: 1941/04/06 Referring Provider (PT): Theadore Nan, MD   Encounter Date: 04/08/2018  PT End of Session - 04/08/18 1547    Visit Number  4    Number of Visits  13    Date for PT Re-Evaluation  05/05/18    Authorization Type  [employer]    PT Start Time  1400    PT Stop Time  1451    PT Time Calculation (min)  51 min    Activity Tolerance  Patient tolerated treatment well    Behavior During Therapy  [facility] for tasks assessed/performed       Past Medical History:  Diagnosis Date  . Anxiety   . Arthritis   . Bundle branch block, left   . Complication of anesthesia    laryngeal spams 50 yrs ago  . Depression   . Dysphagia   . Essential hypertension 06/17/2013  . GERD (gastroesophageal reflux disease)   . Hyperlipidemia   . Kyphosis   . PAC (premature atrial contraction) 06/17/2013  . PAT (paroxysmal atrial tachycardia) ([place]) 06/17/2013  . Plantar fasciitis of right foot   . Preop cardiovascular exam 10/16/2017  . Primary localized osteoarthrosis of right shoulder 11/10/2017  . Right shoulder pain 01/20/2012    Past Surgical History:  Procedure Laterality Date  . ABDOMINAL HYSTERECTOMY    . bladder tac    . CARDIAC CATHETERIZATION    . TONSILLECTOMY    . 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  Subjective Assessment - 04/08/18 1401    Subjective  Reports she did not get to exercise yesterday because she had appointments and errands.     Pertinent History  R shoulder pain, R plantar fasciitis, PAC and PAT, kyphosis, HLD, GERD, HTN,  dysphagia, LBBB, cardiac cath    Diagnostic tests  none recent    Patient Stated Goals  I want to get back to dancing    Currently in Pain?  No/denies                       [facility] Adult PT Treatment/Exercise - 04/08/18 0001      Exercises   Exercises  Lumbar      Lumbar Exercises: Stretches   Single Knee to Chest Stretch  Right;Left;30 seconds;2 reps    Single Knee to Chest Stretch Limitations  to tol    Piriformis Stretch  Right;Left;1 rep;30 seconds   to tolerance, avoiding pain   Piriformis Stretch Limitations  KTOS       Lumbar Exercises: Aerobic   Nustep  Lvl 3, 6 min (UE/LE)      Lumbar Exercises: Supine   Bridge  10 reps   cues to avoid full elevation to avoid pain   Bridge with clamshell  10 reps    Bridge with [employer] Limitations  red TB around knees    Other Supine Lumbar Exercises  TrA + march x20   cues to avoid valsalva   Other Supine Lumbar Exercises  TrA contraction in hooklying 5x10"    with edu on proper palpation and  contraction     Lumbar Exercises: Sidelying   Hip Abduction  Right;Left;10 reps;Limitations    Hip Abduction Limitations  hip abduction and adduction x10 each side    cues for proper alignment   Other Sidelying Lumbar Exercises  B "open book" stretch x 10 reps    to tolerance     Modalities   Modalities  Electrical Stimulation      Moist Heat Therapy   Number Minutes Moist Heat  10 Minutes    Moist Heat Location  Lumbar Spine   thoracic spine     Electrical Stimulation   Electrical Stimulation Location  10    Electrical Stimulation Action  IFC    Electrical Stimulation Parameters  80-150hz ; output 10 to tol; 10 min    Electrical Stimulation Goals  Strength;Tone      Manual Therapy   Manual Therapy  Soft tissue mobilization;Myofascial release    Soft tissue mobilization  STM to B thoracic paraspinals and QL- report of good relief    Myofascial Release  manual TRP to thoracic paraspinals                PT Short Term Goals - 04/06/18 1436      PT SHORT TERM GOAL #1   Title  Patient to be independent with initial HEP.    Time  3    Period  Weeks    Status  Achieved        PT Long Term Goals - 04/02/18 1107      PT LONG TERM GOAL #1   Title  Patient to be independent with advanced HEP.    Time  6    Period  Weeks    Status  On-going      PT LONG TERM GOAL #2   Title  Patient to demonstrate [facility] thoracic and lumbar AROM without pain limiting.     Time  6    Period  Weeks    Status  On-going      PT LONG TERM GOAL #3   Title  Patient to demonstrate and recall safe lifting mechanics to lift 10lb box from ground.     Time  6    Period  Weeks    Status  On-going      PT LONG TERM GOAL #4   Title  Patient to return to shag dancing without c/o pain.    Time  6    Period  Weeks    Status  On-going      PT LONG TERM GOAL #5   Title  Patient to report tolerance of 1 hour of walking without pain limiting.     Time  6    Period  Weeks    Status  On-going            Plan - 04/08/18 1547    Clinical Impression Statement  Patient reports that she was not able to do much exercise yesterday d/t other appointments and errands. Tolerated STM along B thoracic paraspinals and QL. Patient reported good relief from manual therapy. Able to perform LE stretches with c/o mild discomfort in hip with KTOS stretch. Advised patient to ease off as needed to avoid pain. Similarly, patient reporting discomfort with full range bridge- better tolerance with limited range and able to progress bridge with banded resistance around knees. Ended session with moist heat and e-stim to thoracic paraspinals. Patient reporting good relief and no pain. Spoke with patient about using TENS unit  for pain management in the future.     PT Treatment/Interventions  Manual techniques;Patient/family education;Passive range of motion;Dry needling;Energy conservation;Splinting;Taping;ADLs/Self Care  Home Management;Cryotherapy;Electrical Stimulation;Moist Heat;Ultrasound;DME Instruction;Gait training;Stair training;Functional mobility training;Therapeutic activities;Therapeutic exercise;Neuromuscular re-education;Balance training    Consulted and Agree with Plan of Care  Patient       Patient will benefit from skilled therapeutic intervention in order to improve the following deficits and impairments:  Impaired tone, Decreased activity tolerance, Decreased strength, Pain, Difficulty walking, Increased muscle spasms, Improper body mechanics, Decreased range of motion, Impaired flexibility, Postural dysfunction  Visit Diagnosis: Pain in thoracic spine  Other symptoms and signs involving the musculoskeletal system  Abnormal posture     Problem List Patient Active Problem List   Diagnosis Date Noted  . Acute respiratory failure ([place]) 11/10/2017  . Primary localized osteoarthrosis of right shoulder 11/10/2017  . Preoperative cardiovascular examination 10/16/2017  . PAT (paroxysmal atrial tachycardia) ([place]) 06/17/2013  . PAC (premature atrial contraction) 06/17/2013  . Essential hypertension 06/17/2013  . Hyperlipidemia 06/17/2013  . Right shoulder pain 01/20/2012     Janene Harvey, PT, DPT 04/08/18 3:53 PM   [facility] [address]  [place], [place], [address] Phone: [phone]   Fax:  [phone]  Name: Jasmine Swanson MRN: [id number] Date of Birth: 10/01/1940

## 2018-04-13 ENCOUNTER — Encounter: Payer: Self-pay | Admitting: Physical Therapy

## 2018-04-13 ENCOUNTER — Ambulatory Visit: Payer: Medicare Other | Admitting: Physical Therapy

## 2018-04-13 DIAGNOSIS — R293 Abnormal posture: Secondary | ICD-10-CM

## 2018-04-13 DIAGNOSIS — M546 Pain in thoracic spine: Secondary | ICD-10-CM

## 2018-04-13 DIAGNOSIS — R29898 Other symptoms and signs involving the musculoskeletal system: Secondary | ICD-10-CM

## 2018-04-13 NOTE — Therapy (Signed)
[facility] [address]  [place], [place], [address] Phone: [phone]   Fax:  [phone]  Physical Therapy Treatment  Patient Details  Name: Jasmine Swanson MRN: [id number] Date of Birth: 1940-08-24 Referring Provider (PT): Theadore Nan, MD   Encounter Date: 04/13/2018  PT End of Session - 04/13/18 1454    Visit Number  5    Number of Visits  13    Date for PT Re-Evaluation  05/05/18    Authorization Type  [employer]    PT Start Time  1359    PT Stop Time  1501    PT Time Calculation (min)  62 min    Activity Tolerance  Patient tolerated treatment well    Behavior During Therapy  [facility] for tasks assessed/performed       Past Medical History:  Diagnosis Date  . Anxiety   . Arthritis   . Bundle branch block, left   . Complication of anesthesia    laryngeal spams 50 yrs ago  . Depression   . Dysphagia   . Essential hypertension 06/17/2013  . GERD (gastroesophageal reflux disease)   . Hyperlipidemia   . Kyphosis   . PAC (premature atrial contraction) 06/17/2013  . PAT (paroxysmal atrial tachycardia) ([place]) 06/17/2013  . Plantar fasciitis of right foot   . Preop cardiovascular exam 10/16/2017  . Primary localized osteoarthrosis of right shoulder 11/10/2017  . Right shoulder pain 01/20/2012    Past Surgical History:  Procedure Laterality Date  . ABDOMINAL HYSTERECTOMY    . bladder tac    . CARDIAC CATHETERIZATION    . TONSILLECTOMY    . 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  Subjective Assessment - 04/13/18 1400    Subjective  Reports she has been feeling great. Had a twinge in her back the other day when she rolled over in bed. Had good relief from heat and TENS and interested in a personal unit.     Pertinent  History  R shoulder pain, R plantar fasciitis, PAC and PAT, kyphosis, HLD, GERD, HTN, dysphagia, LBBB, cardiac cath    Patient Stated Goals  I want to get back to dancing    Currently in Pain?  No/denies                       [facility] Adult PT Treatment/Exercise - 04/13/18 0001      Exercises   Exercises  Lumbar      Lumbar Exercises: Stretches   Single Knee to Chest Stretch  Right;Left;30 seconds;1 rep   cues for straight opposite knee   Single Knee to Chest Stretch Limitations  to tolerance    Piriformis Stretch  Right;Left;1 rep;30 seconds    Piriformis Stretch Limitations  KTOS       Lumbar Exercises: Aerobic   Nustep  Lvl 3, 6 min (UE/LE)      Lumbar Exercises: Standing   Functional Squats  15 reps    Functional Squats Limitations  counter; to tolerance      Lumbar Exercises: Seated   Other Seated Lumbar Exercises  forward, R, L orange pball reach x5 each direction for thoracolumbar stretch   to tolerance     Lumbar Exercises: Supine   Pelvic Tilt  10 reps  Pelvic Tilt Limitations  ant/pos pelvic tilts in hooklying     Bent Knee Raise  20 reps    Bent Knee Raise Limitations  resisted hip flexion with red TB around toes    Bridge with [employer]  10 reps    Bridge with [employer] Limitations  cues to avoid pushing into painful range    Other Supine Lumbar Exercises  TrA + march x20   cues to slow down   Other Supine Lumbar Exercises  hookylying overhead red medball lift x10   cues for posterior pelvic tilt; avoiding shoulder pain     Lumbar Exercises: Sidelying   Clam  Right;Left;10 reps   manual cues to avoid trunk rotation   Clam Limitations  B with red TB     Other Sidelying Lumbar Exercises  B "open book" stretch x 10 reps       Moist Heat Therapy   Number Minutes Moist Heat  15 Minutes    Moist Heat Location  Lumbar Spine   thoracic spine     Electrical Stimulation   Electrical Stimulation Location  thoracic paraspinals    Electrical  Stimulation Action  IFC    Electrical Stimulation Parameters  80-150hz ; output 14 to tol; 15 min    Electrical Stimulation Goals  Strength;Tone             PT Education - 04/13/18 1453    Education Details  edu on getting up from the floor; update to HEP; edu on personal TENS use for pain relief    Person(s) Educated  Patient    Methods  Explanation;Demonstration;Tactile cues;Verbal cues;Handout    Comprehension  Verbalized understanding;Returned demonstration       PT Short Term Goals - 04/06/18 1436      PT SHORT TERM GOAL #1   Title  Patient to be independent with initial HEP.    Time  3    Period  Weeks    Status  Achieved        PT Long Term Goals - 04/02/18 1107      PT LONG TERM GOAL #1   Title  Patient to be independent with advanced HEP.    Time  6    Period  Weeks    Status  On-going      PT LONG TERM GOAL #2   Title  Patient to demonstrate [facility] thoracic and lumbar AROM without pain limiting.     Time  6    Period  Weeks    Status  On-going      PT LONG TERM GOAL #3   Title  Patient to demonstrate and recall safe lifting mechanics to lift 10lb box from ground.     Time  6    Period  Weeks    Status  On-going      PT LONG TERM GOAL #4   Title  Patient to return to shag dancing without c/o pain.    Time  6    Period  Weeks    Status  On-going      PT LONG TERM GOAL #5   Title  Patient to report tolerance of 1 hour of walking without pain limiting.     Time  6    Period  Weeks    Status  On-going            Plan - 04/13/18 1455    Clinical Impression Statement  Patient arrived to session with report of only  1 episode of muscle spasms in back since last session. Tolerated progressive core and hip strengthening exercises today with intermittent cues to correct form. Performed overhead ball reach with small weighted ball with focus on posterior pelvic tilt- overhead reach limited by R shoulder as patient with hx of TSA. Good understanding on  TrA contraction and able to maintain this while marching. Required heavy manual cues to correct trunk rotation with clamshell. Patient with good performance after cues. Updated HEP to include exercises that were well tolerated today. Patient reported understanding. Patient reporting difficulty getting up from the floor- educated patient on proper sequencing with this transfer. Provided patient with education sheet on TENS use. Ended session with e-stim and moist heat to thoracic spine for pain relief as patient reporting good relief from this modality. Patient with normal integumentary response observed at end of session.     PT Treatment/Interventions  Manual techniques;Patient/family education;Passive range of motion;Dry needling;Energy conservation;Splinting;Taping;ADLs/Self Care Home Management;Cryotherapy;Electrical Stimulation;Moist Heat;Ultrasound;DME Instruction;Gait training;Stair training;Functional mobility training;Therapeutic activities;Therapeutic exercise;Neuromuscular re-education;Balance training    PT Next Visit Plan  progress core strengthening as tolerated    Consulted and Agree with Plan of Care  Patient       Patient will benefit from skilled therapeutic intervention in order to improve the following deficits and impairments:  Impaired tone, Decreased activity tolerance, Decreased strength, Pain, Difficulty walking, Increased muscle spasms, Improper body mechanics, Decreased range of motion, Impaired flexibility, Postural dysfunction  Visit Diagnosis: Pain in thoracic spine  Other symptoms and signs involving the musculoskeletal system  Abnormal posture     Problem List Patient Active Problem List   Diagnosis Date Noted  . Acute respiratory failure ([place]) 11/10/2017  . Primary localized osteoarthrosis of right shoulder 11/10/2017  . Preoperative cardiovascular examination 10/16/2017  . PAT (paroxysmal atrial tachycardia) ([place]) 06/17/2013  . PAC (premature atrial  contraction) 06/17/2013  . Essential hypertension 06/17/2013  . Hyperlipidemia 06/17/2013  . Right shoulder pain 01/20/2012    Janene Harvey, PT, DPT 04/13/18 3:09 PM   [facility] [address]  [address] [place], [place], [address] Phone: [phone]   Fax:  [phone]  Name: Jasmine Swanson MRN: [id number] Date of Birth: 1940-07-07

## 2018-04-13 NOTE — Patient Instructions (Addendum)

## 2018-04-20 DIAGNOSIS — K140 Glossitis: Secondary | ICD-10-CM | POA: Diagnosis not present

## 2018-04-27 ENCOUNTER — Ambulatory Visit: Payer: Medicare Other | Attending: Family Medicine

## 2018-04-27 DIAGNOSIS — M546 Pain in thoracic spine: Secondary | ICD-10-CM

## 2018-04-27 DIAGNOSIS — R29898 Other symptoms and signs involving the musculoskeletal system: Secondary | ICD-10-CM

## 2018-04-27 DIAGNOSIS — R293 Abnormal posture: Secondary | ICD-10-CM | POA: Insufficient documentation

## 2018-04-27 NOTE — Therapy (Addendum)
[facility] [address]  [place], [place], [address] Phone: [phone]   Fax:  [phone]  Physical Therapy Treatment  Patient Details  Name: Jasmine Swanson MRN: [id number] Date of Birth: July 27, 1940 Referring Provider (PT): Theadore Nan, MD   Encounter Date: 04/27/2018  PT End of Session - 04/27/18 1401    Visit Number  6    Number of Visits  13    Date for PT Re-Evaluation  05/05/18    Authorization Type  [employer]    PT Start Time  1357    PT Stop Time  1450    PT Time Calculation (min)  53 min    Activity Tolerance  Patient tolerated treatment well    Behavior During Therapy  [facility] for tasks assessed/performed       Past Medical History:  Diagnosis Date  . Anxiety   . Arthritis   . Bundle branch block, left   . Complication of anesthesia    laryngeal spams 50 yrs ago  . Depression   . Dysphagia   . Essential hypertension 06/17/2013  . GERD (gastroesophageal reflux disease)   . Hyperlipidemia   . Kyphosis   . PAC (premature atrial contraction) 06/17/2013  . PAT (paroxysmal atrial tachycardia) ([place]) 06/17/2013  . Plantar fasciitis of right foot   . Preop cardiovascular exam 10/16/2017  . Primary localized osteoarthrosis of right shoulder 11/10/2017  . Right shoulder pain 01/20/2012    Past Surgical History:  Procedure Laterality Date  . ABDOMINAL HYSTERECTOMY    . bladder tac    . CARDIAC CATHETERIZATION    . TONSILLECTOMY    . 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  Subjective Assessment - 04/27/18 1400    Subjective  Pt. doing well today and reports no recent spasms.     Pertinent History  R shoulder pain, R plantar fasciitis, PAC and PAT, kyphosis, HLD, GERD, HTN, dysphagia, LBBB, cardiac cath    Patient  Stated Goals  I want to get back to dancing    Currently in Pain?  No/denies    Pain Score  0-No pain    Multiple Pain Sites  No         [facility] PT Assessment - 04/27/18 1437      AROM   Lumbar Flexion  toes- however heavy hip flexion focus    Lumbar Extension  mod limittion    LBP up to 4/10   Lumbar - Right Side Bend  to joint line    pain free   Lumbar - Left Side Bend  to joint line    pain free   Lumbar - Right Rotation  mildly limited    Lumbar - Left Rotation  mildly limited                   [facility] Adult PT Treatment/Exercise - 04/27/18 1410      Therapeutic Activites    Therapeutic Activities  Lifting    Lifting  Reviewed proper lifting technique with 10# step stool with weight stacked on stool (10# total ) lifting x 3 from floor to table; cues required to widen BOS; good overall technique       Lumbar Exercises: Stretches   Single Knee to Chest Stretch  Right;Left;2 reps;30 seconds  Single Knee to Chest Stretch Limitations  to tolerance      Lumbar Exercises: Aerobic   Nustep  Lvl 3, 7 min (UE/LE)      Lumbar Exercises: Machines for Strengthening   Cybex Knee Flexion  LE's 20# 2 x 10 reps       Lumbar Exercises: Standing   Heel Raises  15 reps;3 seconds    Heel Raises Limitations  machine support     Functional Squats  15 reps;3 seconds    Functional Squats Limitations  TRX - cues for technique required       Lumbar Exercises: Seated   Other Seated Lumbar Exercises  B seated pallof press with red TB sitting on aqua disk x 10 reps       Lumbar Exercises: Sidelying   Other Sidelying Lumbar Exercises  B "open book" stretch x 10 reps              PT Education - 04/27/18 1510    Education Details  HEP update     Person(s) Educated  Patient    Methods  Explanation;Demonstration;Verbal cues;Handout    Comprehension  Verbalized understanding;Returned demonstration;Verbal cues required;Need further instruction       PT Short Term Goals -  04/06/18 1436      PT SHORT TERM GOAL #1   Title  Patient to be independent with initial HEP.    Time  3    Period  Weeks    Status  Achieved        PT Long Term Goals - 04/27/18 1433      PT LONG TERM GOAL #1   Title  Patient to be independent with advanced HEP.    Time  6    Period  Weeks    Status  Partially Met      PT LONG TERM GOAL #2   Title  Patient to demonstrate [facility] thoracic and lumbar AROM without pain limiting.     Time  6    Period  Weeks    Status  Partially Met      PT LONG TERM GOAL #3   Title  Patient to demonstrate and recall safe lifting mechanics to lift 10lb box from ground.     Time  6    Period  Weeks    Status  Achieved      PT LONG TERM GOAL #4   Title  Patient to return to shag dancing without c/o pain.    Time  6    Period  Weeks    Status  On-going      PT LONG TERM GOAL #5   Title  Patient to report tolerance of 1 hour of walking without pain limiting.     Time  6    Period  Weeks    Status  Achieved            Plan - 04/27/18 1402    Clinical Impression Statement  Seen today after being on vacation ~ 2 weeks.  Able to demo improved lumbar ROM today partially meeting goal with most limitation still with lumbar extension ROM.  Reports she is now able to walk ~ 1 hour without increased LBP however unsure if she can shag dance without increased LBP.  Plans to possibly shag dance tomorrow night.  Some cueing required with lifting 10# from floor today for proper LE spacing however pt. able to demo good overall technique.  Ended visit with pt. declining modalities.  Pt. verbalizing plans to bring in TENS unit in coming visits as she is to receive this in mail soon.      PT Treatment/Interventions  Manual techniques;Patient/family education;Passive range of motion;Dry needling;Energy conservation;Splinting;Taping;ADLs/Self Care Home Management;Cryotherapy;Electrical Stimulation;Moist Heat;Ultrasound;DME Instruction;Gait training;Stair  training;Functional mobility training;Therapeutic activities;Therapeutic exercise;Neuromuscular re-education;Balance training    PT Next Visit Plan  progress core strengthening as tolerated    Consulted and Agree with Plan of Care  Patient       Patient will benefit from skilled therapeutic intervention in order to improve the following deficits and impairments:  Impaired tone, Decreased activity tolerance, Decreased strength, Pain, Difficulty walking, Increased muscle spasms, Improper body mechanics, Decreased range of motion, Impaired flexibility, Postural dysfunction  Visit Diagnosis: Pain in thoracic spine  Other symptoms and signs involving the musculoskeletal system  Abnormal posture     Problem List Patient Active Problem List   Diagnosis Date Noted  . Acute respiratory failure ([place]) 11/10/2017  . Primary localized osteoarthrosis of right shoulder 11/10/2017  . Preoperative cardiovascular examination 10/16/2017  . PAT (paroxysmal atrial tachycardia) ([place]) 06/17/2013  . PAC (premature atrial contraction) 06/17/2013  . Essential hypertension 06/17/2013  . Hyperlipidemia 06/17/2013  . Right shoulder pain 01/20/2012    Bess Harvest, PTA 04/27/18 3:10 PM   [facility] [address]  [place], [place], [address] Phone: [phone]   Fax:  [phone]  Name: Jasmine Swanson MRN: [id number] Date of Birth: [birth year]

## 2018-04-29 ENCOUNTER — Ambulatory Visit: Payer: Medicare Other

## 2018-04-29 DIAGNOSIS — R29898 Other symptoms and signs involving the musculoskeletal system: Secondary | ICD-10-CM | POA: Diagnosis not present

## 2018-04-29 DIAGNOSIS — R293 Abnormal posture: Secondary | ICD-10-CM

## 2018-04-29 DIAGNOSIS — M546 Pain in thoracic spine: Secondary | ICD-10-CM

## 2018-04-29 NOTE — Therapy (Signed)
[facility] [address]  [place], [place], [address] Phone: [phone]   Fax:  [phone]  Physical Therapy Treatment  Patient Details  Name: Jasmine Swanson MRN: [id number] Date of Birth: June 15, 1940 Referring Provider (PT): Theadore Nan, MD   Encounter Date: 04/29/2018  PT End of Session - 04/29/18 1403    Visit Number  7    Number of Visits  13    Date for PT Re-Evaluation  05/05/18    Authorization Type  [employer]    PT Start Time  6962    PT Stop Time  1441    PT Time Calculation (min)  44 min    Activity Tolerance  Patient tolerated treatment well    Behavior During Therapy  [facility] for tasks assessed/performed       Past Medical History:  Diagnosis Date  . Anxiety   . Arthritis   . Bundle branch block, left   . Complication of anesthesia    laryngeal spams 50 yrs ago  . Depression   . Dysphagia   . Essential hypertension 06/17/2013  . GERD (gastroesophageal reflux disease)   . Hyperlipidemia   . Kyphosis   . PAC (premature atrial contraction) 06/17/2013  . PAT (paroxysmal atrial tachycardia) ([place]) 06/17/2013  . Plantar fasciitis of right foot   . Preop cardiovascular exam 10/16/2017  . Primary localized osteoarthrosis of right shoulder 11/10/2017  . Right shoulder pain 01/20/2012    Past Surgical History:  Procedure Laterality Date  . ABDOMINAL HYSTERECTOMY    . bladder tac    . CARDIAC CATHETERIZATION    . TONSILLECTOMY    . 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  Subjective Assessment - 04/29/18 1401    Subjective  Pt. reporting she was able to dance for an hour last night without increased pain.      Pertinent History  R shoulder pain, R plantar fasciitis, PAC and PAT, kyphosis, HLD, GERD, HTN,  dysphagia, LBBB, cardiac cath    Diagnostic tests  none recent    Patient Stated Goals  I want to get back to dancing    Currently in Pain?  No/denies    Pain Score  0-No pain    Multiple Pain Sites  No                       [facility] Adult PT Treatment/Exercise - 04/29/18 1418      Transfers   Comments  Car transfer in/out x 2; focusing on turning back to car and sitting then pivoting into car with both legs as to reduce back strain; focused on proper hand placement and upright posture to reduce lumbar strain with standing from car seat      Lumbar Exercises: Stretches   Piriformis Stretch  Right;Left;1 rep;30 seconds    Piriformis Stretch Limitations  KTOS       Lumbar Exercises: Aerobic   Nustep  Lvl 3, 7 min (UE/LE)      Lumbar Exercises: Machines for Strengthening   Other Lumbar Machine Exercise  BATCA low row 15# x 15 reps       Lumbar Exercises: Seated   Sit to Stand  10 reps      Lumbar Exercises: Supine   Bridge  15  reps    Other Supine Lumbar Exercises  Hooklying adduction ball squeeze 5" x 10 reps                PT Short Term Goals - 04/06/18 1436      PT SHORT TERM GOAL #1   Title  Patient to be independent with initial HEP.    Time  3    Period  Weeks    Status  Achieved        PT Long Term Goals - 04/29/18 1402      PT LONG TERM GOAL #1   Title  Patient to be independent with advanced HEP.    Time  6    Period  Weeks    Status  Partially Met      PT LONG TERM GOAL #2   Title  Patient to demonstrate [facility] thoracic and lumbar AROM without pain limiting.     Time  6    Period  Weeks    Status  Partially Met      PT LONG TERM GOAL #3   Title  Patient to demonstrate and recall safe lifting mechanics to lift 10lb box from ground.     Time  6    Period  Weeks    Status  Achieved      PT LONG TERM GOAL #4   Title  Patient to return to shag dancing without c/o pain.    Time  6    Period  Weeks    Status  Achieved      PT LONG  TERM GOAL #5   Title  Patient to report tolerance of 1 hour of walking without pain limiting.     Time  6    Period  Weeks    Status  Achieved            Plan - 04/29/18 1405    Clinical Impression Statement  Performed well with treatment session today.  Reports she shag danced last night for 1 hour without increased pain achieving this LTG.  Pt. has now met or partially met all LTG's.  Pt. reporting some LBP occasionally with car transfers thus walking out to parking lot today and reviewed proper mechanics with this movement today at pt.'s car with min cueing required for proper technique.  Ended visit pain free.  Pt. noting no recent back spasms and reports she has not had significant pain last few days.  Progressing well.    Rehab Potential  Good    PT Frequency  2x / week    PT Treatment/Interventions  Manual techniques;Patient/family education;Passive range of motion;Dry needling;Energy conservation;Splinting;Taping;ADLs/Self Care Home Management;Cryotherapy;Electrical Stimulation;Moist Heat;Ultrasound;DME Instruction;Gait training;Stair training;Functional mobility training;Therapeutic activities;Therapeutic exercise;Neuromuscular re-education;Balance training    PT Next Visit Plan  progress core strengthening as tolerated    Consulted and Agree with Plan of Care  Patient       Patient will benefit from skilled therapeutic intervention in order to improve the following deficits and impairments:  Impaired tone, Decreased activity tolerance, Decreased strength, Pain, Difficulty walking, Increased muscle spasms, Improper body mechanics, Decreased range of motion, Impaired flexibility, Postural dysfunction  Visit Diagnosis: Pain in thoracic spine  Other symptoms and signs involving the musculoskeletal system  Abnormal posture     Problem List Patient Active Problem List   Diagnosis Date Noted  . Acute respiratory failure ([place]) 11/10/2017  . Primary localized osteoarthrosis of  right shoulder 11/10/2017  . Preoperative cardiovascular examination 10/16/2017  .  PAT (paroxysmal atrial tachycardia) ([place]) 06/17/2013  . PAC (premature atrial contraction) 06/17/2013  . Essential hypertension 06/17/2013  . Hyperlipidemia 06/17/2013  . Right shoulder pain 01/20/2012    Bess Harvest, PTA 04/29/18 3:00 PM   [facility] [address]  [place], [place], [address] Phone: [phone]   Fax:  [phone]  Name: Jasmine Swanson MRN: [id number] Date of Birth: 07-May-1941

## 2018-05-04 ENCOUNTER — Ambulatory Visit: Payer: Medicare Other

## 2018-05-04 DIAGNOSIS — M546 Pain in thoracic spine: Secondary | ICD-10-CM

## 2018-05-04 DIAGNOSIS — R293 Abnormal posture: Secondary | ICD-10-CM

## 2018-05-04 DIAGNOSIS — R29898 Other symptoms and signs involving the musculoskeletal system: Secondary | ICD-10-CM | POA: Diagnosis not present

## 2018-05-04 NOTE — Therapy (Addendum)
[facility] [address]  [place], [place], [address] Phone: [phone]   Fax:  [phone]  Physical Therapy Treatment  Patient Details  Name: Jasmine Swanson MRN: [id number] Date of Birth: May 18, 1941 Referring Provider (PT): Theadore Nan, MD   Progress Note Reporting Period 03/24/18 to 05/04/18  See note below for Objective Data and Assessment of Progress/Goals.    Encounter Date: 05/04/2018  PT End of Session - 05/04/18 1410    Visit Number  8    Number of Visits  13    Date for PT Re-Evaluation  05/05/18    Authorization Type  [employer]    PT Start Time  1400    PT Stop Time  1449    PT Time Calculation (min)  49 min    Activity Tolerance  Patient tolerated treatment well    Behavior During Therapy  WFL for tasks assessed/performed       Past Medical History:  Diagnosis Date  . Anxiety   . Arthritis   . Bundle branch block, left   . Complication of anesthesia    laryngeal spams 50 yrs ago  . Depression   . Dysphagia   . Essential hypertension 06/17/2013  . GERD (gastroesophageal reflux disease)   . Hyperlipidemia   . Kyphosis   . PAC (premature atrial contraction) 06/17/2013  . PAT (paroxysmal atrial tachycardia) ([place]) 06/17/2013  . Plantar fasciitis of right foot   . Preop cardiovascular exam 10/16/2017  . Primary localized osteoarthrosis of right shoulder 11/10/2017  . Right shoulder pain 01/20/2012    Past Surgical History:  Procedure Laterality Date  . ABDOMINAL HYSTERECTOMY    . bladder tac    . CARDIAC CATHETERIZATION    . TONSILLECTOMY    . TOTAL SHOULDER ARTHROPLASTY Right 11/10/2017   Procedure: TOTAL SHOULDER ARTHROPLASTY;  Surgeon: Marchia Bond, MD;  Location: [facility];  Service: Orthopedics;  Laterality: Right;    There were no vitals filed for this visit.  Subjective Assessment - 05/04/18 1409    Subjective  Pt. reporting she plans to [facility] dance tomorrow night.      Pertinent History  R shoulder pain, R plantar fasciitis, PAC and PAT, kyphosis, HLD, GERD, HTN, dysphagia, LBBB, cardiac cath    Diagnostic tests  none recent    Patient Stated Goals  I want to get back to dancing    Currently in Pain?  No/denies    Pain Score  0-No pain    Multiple Pain Sites  No         [facility] PT Assessment - 05/04/18 1417      Observation/Other Assessments   Focus on Therapeutic Outcomes (FOTO)   69% (31% limitation)       AROM   Lumbar Flexion  toes     Lumbar Extension  mod limittion     Lumbar - Right Side Bend  to joint line     Lumbar - Left Side Bend  to joint line     Lumbar - Right Rotation  mildly limited    Lumbar - Left Rotation  mildly limited    Thoracic Flexion  mildly limited    Thoracic Extension  moderately limited    Thoracic - Right Side Bend  mildly limited    Thoracic - Left Side Bend  mildly limited    Thoracic - Right Rotation  mildly limited     Thoracic - Left Rotation  mildly limited  [facility] Adult PT Treatment/Exercise - 05/04/18 1416      Self-Care   Self-Care  Other Self-Care Comments    Other Self-Care Comments   Reviewed comprehensive HEP to check for understanding, highlight relevant exercises, and updated with machine strengthening activities including machine row, machine pulldown, machine HS curl       Lumbar Exercises: Stretches   Single Knee to Chest Stretch  Right;Left;2 reps;30 seconds    Single Knee to Chest Stretch Limitations  to tolerance    Piriformis Stretch  Right;Left;1 rep;30 seconds    Piriformis Stretch Limitations  KTOS       Lumbar Exercises: Aerobic   Nustep  Lvl 3, 7 min (UE/LE)      Lumbar Exercises: Machines for Strengthening   Other Lumbar Machine Exercise  BATCA low row 15# x 15 reps       Lumbar Exercises: Supine   Bridge with [employer]  10 reps    Bridge with [employer] Limitations  cues to avoid pushing into painful range             PT Education  - 05/04/18 1608    Education Details  HEP update     Person(s) Educated  Patient    Methods  Explanation;Demonstration;Verbal cues;Handout    Comprehension  Verbalized understanding;Returned demonstration;Verbal cues required;Need further instruction       PT Short Term Goals - 04/06/18 1436      PT SHORT TERM GOAL #1   Title  Patient to be independent with initial HEP.    Time  3    Period  Weeks    Status  Achieved        PT Long Term Goals - 05/04/18 1419      PT LONG TERM GOAL #1   Title  Patient to be independent with advanced HEP.    Time  6    Period  Weeks    Status  Achieved      PT LONG TERM GOAL #2   Title  Patient to demonstrate [facility] thoracic and lumbar AROM without pain limiting.     Time  6    Period  Weeks    Status  Partially Met   Able to demo improved thoracic and lumbar AROM since beginning of therapy and painfree at end ranges of motion today      PT LONG TERM GOAL #3   Title  Patient to demonstrate and recall safe lifting mechanics to lift 10lb box from ground.     Time  6    Period  Weeks    Status  Achieved      PT LONG TERM GOAL #4   Title  Patient to return to shag dancing without c/o pain.    Time  6    Period  Weeks    Status  Achieved      PT LONG TERM GOAL #5   Title  Patient to report tolerance of 1 hour of walking without pain limiting.     Time  6    Period  Weeks    Status  Achieved            Plan - 05/04/18 1413    Clinical Impression Statement  Jasmine Swanson reporting no recent back spasms and ability to return to shag dancing x 2 nights without back pain over past week.  Pt. has partially met or met all LTG's at this time and discussed this with pt. today with pt. agreeing  to 30-day hold on therapy.  Supervising PT in agreement with this and comprehensive HEP reviewed and updated today with machine strengthening activities for pt. to perform at local gym for ongoing fitness.  Pt. verbalized understanding and now on 30-day hold  from therapy.      PT Treatment/Interventions  Manual techniques;Patient/family education;Passive range of motion;Dry needling;Energy conservation;Splinting;Taping;ADLs/Self Care Home Management;Cryotherapy;Electrical Stimulation;Moist Heat;Ultrasound;DME Instruction;Gait training;Stair training;Functional mobility training;Therapeutic activities;Therapeutic exercise;Neuromuscular re-education;Balance training    PT Next Visit Plan  30-day hold     Consulted and Agree with Plan of Care  Patient       Patient will benefit from skilled therapeutic intervention in order to improve the following deficits and impairments:  Impaired tone, Decreased activity tolerance, Decreased strength, Pain, Difficulty walking, Increased muscle spasms, Improper body mechanics, Decreased range of motion, Impaired flexibility, Postural dysfunction  Visit Diagnosis: Pain in thoracic spine  Other symptoms and signs involving the musculoskeletal system  Abnormal posture     Problem List Patient Active Problem List   Diagnosis Date Noted  . Acute respiratory failure ([place]) 11/10/2017  . Primary localized osteoarthrosis of right shoulder 11/10/2017  . Preoperative cardiovascular examination 10/16/2017  . PAT (paroxysmal atrial tachycardia) ([place]) 06/17/2013  . PAC (premature atrial contraction) 06/17/2013  . Essential hypertension 06/17/2013  . Hyperlipidemia 06/17/2013  . Right shoulder pain 01/20/2012    Bess Harvest, PTA 05/04/18 4:09 PM   [facility] [address]  [place], [place], [address] Phone: [phone]   Fax:  [phone]  Name: Jasmine Swanson MRN: [id number] Date of Birth: 1940/11/20  PHYSICAL THERAPY DISCHARGE SUMMARY  Visits from Start of Care: 8  Current functional level related to goals / functional outcomes: See above clinical impression   Remaining deficits: Decreased thoracolumbar AROM   Education /  Equipment: HEP  Plan: Patient agrees to discharge.  Patient goals were partially met. Patient is being discharged due to being pleased with the current functional level.  ?????     Janene Harvey, PT, DPT 06/07/18 1:33 PM

## 2018-05-05 DIAGNOSIS — K140 Glossitis: Secondary | ICD-10-CM | POA: Diagnosis not present

## 2018-05-07 ENCOUNTER — Encounter: Payer: Medicare Other | Admitting: Physical Therapy

## 2018-06-02 DIAGNOSIS — M19011 Primary osteoarthritis, right shoulder: Secondary | ICD-10-CM | POA: Diagnosis not present

## 2018-06-14 DIAGNOSIS — J069 Acute upper respiratory infection, unspecified: Secondary | ICD-10-CM | POA: Diagnosis not present

## 2018-09-08 DIAGNOSIS — N3090 Cystitis, unspecified without hematuria: Secondary | ICD-10-CM | POA: Diagnosis not present

## 2019-02-08 DIAGNOSIS — D225 Melanocytic nevi of trunk: Secondary | ICD-10-CM | POA: Diagnosis not present

## 2019-02-08 DIAGNOSIS — L720 Epidermal cyst: Secondary | ICD-10-CM | POA: Diagnosis not present

## 2019-02-08 DIAGNOSIS — L821 Other seborrheic keratosis: Secondary | ICD-10-CM | POA: Diagnosis not present

## 2019-02-08 DIAGNOSIS — Z85828 Personal history of other malignant neoplasm of skin: Secondary | ICD-10-CM | POA: Diagnosis not present

## 2019-02-18 ENCOUNTER — Other Ambulatory Visit: Payer: Self-pay | Admitting: Family Medicine

## 2019-02-18 DIAGNOSIS — Z1231 Encounter for screening mammogram for malignant neoplasm of breast: Secondary | ICD-10-CM

## 2019-03-18 DIAGNOSIS — Z1389 Encounter for screening for other disorder: Secondary | ICD-10-CM | POA: Diagnosis not present

## 2019-03-18 DIAGNOSIS — I10 Essential (primary) hypertension: Secondary | ICD-10-CM | POA: Diagnosis not present

## 2019-03-18 DIAGNOSIS — Z0000 Encounter for general adult medical examination without abnormal findings: Secondary | ICD-10-CM | POA: Diagnosis not present

## 2019-03-18 DIAGNOSIS — E782 Mixed hyperlipidemia: Secondary | ICD-10-CM | POA: Diagnosis not present

## 2019-03-22 DIAGNOSIS — Z23 Encounter for immunization: Secondary | ICD-10-CM | POA: Diagnosis not present

## 2019-03-22 DIAGNOSIS — Z0000 Encounter for general adult medical examination without abnormal findings: Secondary | ICD-10-CM | POA: Diagnosis not present

## 2019-03-22 DIAGNOSIS — J069 Acute upper respiratory infection, unspecified: Secondary | ICD-10-CM | POA: Diagnosis not present

## 2019-03-22 DIAGNOSIS — E538 Deficiency of other specified B group vitamins: Secondary | ICD-10-CM | POA: Diagnosis not present

## 2019-04-05 ENCOUNTER — Ambulatory Visit: Payer: Medicare Other

## 2019-05-09 ENCOUNTER — Ambulatory Visit
Admission: RE | Admit: 2019-05-09 | Discharge: 2019-05-09 | Disposition: A | Discharge status: Home / Self Care | Payer: Medicare Other | Source: Ambulatory Visit | Attending: Family Medicine | Admitting: Family Medicine

## 2019-05-09 ENCOUNTER — Other Ambulatory Visit: Payer: Self-pay

## 2019-05-09 DIAGNOSIS — Z1231 Encounter for screening mammogram for malignant neoplasm of breast: Secondary | ICD-10-CM

## 2019-07-25 DIAGNOSIS — Z0120 Encounter for dental examination and cleaning without abnormal findings: Secondary | ICD-10-CM | POA: Diagnosis not present

## 2019-07-28 ENCOUNTER — Ambulatory Visit: Payer: Medicare Other | Attending: Internal Medicine

## 2019-07-28 DIAGNOSIS — Z23 Encounter for immunization: Secondary | ICD-10-CM | POA: Insufficient documentation

## 2019-07-28 NOTE — Progress Notes (Signed)
   Covid-19 Vaccination Clinic  Name:  Jasmine Swanson    MRN: [id number] DOB: [birth year]  07/28/2019  [name] was observed post Covid-19 immunization for 15 minutes without incident. She was provided with Vaccine Information Sheet and instruction to access the V-Safe system.   [name] was instructed to call 911 with any severe reactions post vaccine: Marland Kitchen Difficulty breathing  . Swelling of face and throat  . A fast heartbeat  . A bad rash all over body  . Dizziness and weakness

## 2019-08-23 ENCOUNTER — Ambulatory Visit: Payer: Medicare Other | Attending: Internal Medicine

## 2019-08-23 DIAGNOSIS — Z23 Encounter for immunization: Secondary | ICD-10-CM

## 2019-08-23 NOTE — Progress Notes (Signed)
   Covid-19 Vaccination Clinic  Name:  Jasmine Swanson    MRN: [id number] DOB: Dec 02, 1940  08/23/2019  [name] was observed post Covid-19 immunization for 15 minutes without incident. She was provided with Vaccine Information Sheet and instruction to access the V-Safe system.   [name] was instructed to call 911 with any severe reactions post vaccine: Marland Kitchen Difficulty breathing  . Swelling of face and throat  . A fast heartbeat  . A bad rash all over body  . Dizziness and weakness   Immunizations Administered    Name Date Dose VIS Date Route   Pfizer COVID-19 Vaccine 08/23/2019  3:32 PM 0.3 mL 05/06/2019 Intramuscular   Manufacturer: [employer]   Lot: [id number]   [place]: [id number]

## 2019-09-13 DIAGNOSIS — R1310 Dysphagia, unspecified: Secondary | ICD-10-CM | POA: Diagnosis not present

## 2019-09-13 DIAGNOSIS — E782 Mixed hyperlipidemia: Secondary | ICD-10-CM | POA: Diagnosis not present

## 2019-09-13 DIAGNOSIS — K219 Gastro-esophageal reflux disease without esophagitis: Secondary | ICD-10-CM | POA: Diagnosis not present

## 2019-09-13 DIAGNOSIS — I10 Essential (primary) hypertension: Secondary | ICD-10-CM | POA: Diagnosis not present

## 2019-09-14 DIAGNOSIS — Z0000 Encounter for general adult medical examination without abnormal findings: Secondary | ICD-10-CM | POA: Diagnosis not present

## 2019-09-14 DIAGNOSIS — I10 Essential (primary) hypertension: Secondary | ICD-10-CM | POA: Diagnosis not present

## 2019-09-14 DIAGNOSIS — J069 Acute upper respiratory infection, unspecified: Secondary | ICD-10-CM | POA: Diagnosis not present

## 2019-09-14 DIAGNOSIS — G2581 Restless legs syndrome: Secondary | ICD-10-CM | POA: Diagnosis not present

## 2019-09-19 DIAGNOSIS — G2581 Restless legs syndrome: Secondary | ICD-10-CM | POA: Diagnosis not present

## 2019-12-27 ENCOUNTER — Ambulatory Visit: Payer: Medicare Other | Admitting: Podiatry

## 2019-12-27 ENCOUNTER — Other Ambulatory Visit: Payer: Self-pay

## 2019-12-27 ENCOUNTER — Ambulatory Visit (INDEPENDENT_AMBULATORY_CARE_PROVIDER_SITE_OTHER): Payer: Medicare Other

## 2019-12-27 ENCOUNTER — Encounter: Payer: Self-pay | Admitting: Podiatry

## 2019-12-27 DIAGNOSIS — M778 Other enthesopathies, not elsewhere classified: Secondary | ICD-10-CM | POA: Diagnosis not present

## 2019-12-27 DIAGNOSIS — G5793 Unspecified mononeuropathy of bilateral lower limbs: Secondary | ICD-10-CM | POA: Diagnosis not present

## 2019-12-27 MED ORDER — GABAPENTIN 100 MG PO CAPS: 100.0000 mg | ORAL_CAPSULE | Freq: Four times a day (QID) | ORAL | 3 refills | Status: DC

## 2019-12-27 NOTE — Progress Notes (Signed)
Subjective:  Patient ID: Jasmine Swanson, female    DOB: 06/30/1940,  MRN: [id number] HPI Chief Complaint  Patient presents with  . Foot Pain    Plantar forefoot and toes bilateral - burning, tingling, numbness x several months, tried ice baths, cold water, keeping a fan on them, usually worse at night-Retired RN  . New Patient (Initial Visit)    Est pt 3+    79 y.o. female presents with the above complaint.   ROS: Denies fever chills nausea vomiting muscle aches pains calf pain back pain chest pain shortness of breath.  States that her tingling in her toes and feet only occurs at nighttime when she is in bed.  But they wake her up from a dead sleep and sometimes will not allow her to go to sleep.  She has no calf pain no thigh pain.  Past Medical History:  Diagnosis Date  . Anxiety   . Arthritis   . Bundle branch block, left   . Complication of anesthesia    laryngeal spams 50 yrs ago  . Depression   . Dysphagia   . Essential hypertension 06/17/2013  . GERD (gastroesophageal reflux disease)   . Hyperlipidemia   . Kyphosis   . PAC (premature atrial contraction) 06/17/2013  . PAT (paroxysmal atrial tachycardia) ([place]) 06/17/2013  . Plantar fasciitis of right foot   . Preop cardiovascular exam 10/16/2017  . Primary localized osteoarthrosis of right shoulder 11/10/2017  . Right shoulder pain 01/20/2012   Past Surgical History:  Procedure Laterality Date  . ABDOMINAL HYSTERECTOMY    . bladder tac    . CARDIAC CATHETERIZATION    . TONSILLECTOMY    . 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  acetaminophen (TYLENOL) 650 MG CR tablet, Take 650 mg by mouth every 8 (eight) hours as needed for pain., Disp: , Rfl:  .  Ascorbic Acid (VITAMIN C) 1000 MG tablet, Take 1,000 mg by mouth daily., Disp: , Rfl:  .   aspirin EC 81 MG tablet, Take 81 mg by mouth every Monday, Wednesday, and Friday., Disp: , Rfl:  .  Calcium Carb-Cholecalciferol (CALCIUM-VITAMIN D) 600-400 MG-UNIT TABS, Take 1 tablet by mouth daily., Disp: , Rfl:  .  Cholecalciferol 4000 units CAPS, Take 4,000 Units by mouth daily., Disp: , Rfl:  .  escitalopram (LEXAPRO) 10 MG tablet, Take 10 mg by mouth every evening. , Disp: , Rfl:  .  fish oil-omega-3 fatty acids 1000 MG capsule, Take 1 g by mouth daily. , Disp: , Rfl:  .  Flaxseed, Linseed, (FLAXSEED OIL) 1000 MG CAPS, Take 1,000 mg by mouth daily., Disp: , Rfl:  .  gabapentin (NEURONTIN) 100 MG capsule, Take 1 capsule (100 mg total) by mouth 4 (four) times daily., Disp: 30 capsule, Rfl: 3 .  Magnesium 250 MG TABS, Take 250 mg by mouth daily. , Disp: , Rfl:  .  metoprolol (TOPROL-XL) 200 MG 24 hr tablet, Take 200 mg by mouth every evening. , Disp: , Rfl:  .  omeprazole (PRILOSEC) 20 MG capsule, Take 20 mg by mouth every other day., Disp: , Rfl:  .  pyridOXINE (VITAMIN B-6) 100 MG tablet, Take 100 mg by mouth daily., Disp: , Rfl:  .  rosuvastatin (CRESTOR) 20 MG tablet, Take 20 mg by mouth every evening. , Disp: , Rfl:  Allergies  Allergen Reactions  . Benicar Hct [Olmesartan Medoxomil-Hctz]     Edema of lower extremities   . Codeine Other (See Comments)    Drops blood pressure  . Diovan [Valsartan] Other (See Comments)    Palpitations.    . Niacin And Related Other (See Comments)    Turns patient red, makes her pass out  . Solu-Medrol [Methylprednisolone]     Increased HR and shaking all over    Review of Systems Objective:  There were no vitals filed for this visit.  General: Well developed, nourished, in no acute distress, alert and oriented x3   Dermatological: Skin is warm, dry and supple bilateral. Nails x 10 are well maintained; remaining integument appears unremarkable at this time. There are no open sores, no preulcerative lesions, no rash or signs of infection  present.  Vascular: Dorsalis Pedis artery and Posterior Tibial artery pedal pulses are 2/4 bilateral with immedate capillary fill time. Pedal hair growth present. No varicosities and no lower extremity edema present bilateral.   Neruologic: Grossly intact via light touch bilateral. Vibratory intact via tuning fork bilateral. Protective threshold with Semmes Wienstein monofilament intact to all pedal sites bilateral. Patellar and Achilles deep tendon reflexes 2+ bilateral. No Babinski or clonus noted bilateral.   Musculoskeletal: No gross boney pedal deformities bilateral. No pain, crepitus, or limitation noted with foot and ankle range of motion bilateral. Muscular strength 5/5 in all groups tested bilateral.  Gait: Unassisted, Nonantalgic.    Radiographs:  Radiographs taken today do not demonstrate any type of osseous abnormalities other than osteopenia some early osteoarthritic changes in the forefoot bilateral.  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  Plan: Discussed etiology pathology and surgical therapies at this point we will start her on 100 mg of gabapentin and I will follow-up with her in 1 month gabapentin will be dispensed 1 p.o. nightly.     Ezequias Lard T. [place], [place]

## 2020-01-12 DIAGNOSIS — Z85828 Personal history of other malignant neoplasm of skin: Secondary | ICD-10-CM | POA: Diagnosis not present

## 2020-01-12 DIAGNOSIS — K130 Diseases of lips: Secondary | ICD-10-CM | POA: Diagnosis not present

## 2020-01-26 DIAGNOSIS — Z0120 Encounter for dental examination and cleaning without abnormal findings: Secondary | ICD-10-CM | POA: Diagnosis not present

## 2020-01-31 ENCOUNTER — Ambulatory Visit: Payer: Medicare Other | Admitting: Podiatry

## 2020-03-14 DIAGNOSIS — E538 Deficiency of other specified B group vitamins: Secondary | ICD-10-CM | POA: Diagnosis not present

## 2020-03-14 DIAGNOSIS — Z79899 Other long term (current) drug therapy: Secondary | ICD-10-CM | POA: Diagnosis not present

## 2020-03-14 DIAGNOSIS — Z0000 Encounter for general adult medical examination without abnormal findings: Secondary | ICD-10-CM | POA: Diagnosis not present

## 2020-03-14 DIAGNOSIS — I10 Essential (primary) hypertension: Secondary | ICD-10-CM | POA: Diagnosis not present

## 2020-03-19 DIAGNOSIS — E782 Mixed hyperlipidemia: Secondary | ICD-10-CM | POA: Diagnosis not present

## 2020-03-19 DIAGNOSIS — Z0000 Encounter for general adult medical examination without abnormal findings: Secondary | ICD-10-CM | POA: Diagnosis not present

## 2020-03-19 DIAGNOSIS — I10 Essential (primary) hypertension: Secondary | ICD-10-CM | POA: Diagnosis not present

## 2020-03-19 DIAGNOSIS — E538 Deficiency of other specified B group vitamins: Secondary | ICD-10-CM | POA: Diagnosis not present

## 2020-03-29 ENCOUNTER — Other Ambulatory Visit: Payer: Self-pay | Admitting: Family Medicine

## 2020-03-29 DIAGNOSIS — Z1231 Encounter for screening mammogram for malignant neoplasm of breast: Secondary | ICD-10-CM

## 2020-03-30 ENCOUNTER — Other Ambulatory Visit: Payer: Self-pay | Admitting: Family Medicine

## 2020-03-30 DIAGNOSIS — M8580 Other specified disorders of bone density and structure, unspecified site: Secondary | ICD-10-CM

## 2020-04-20 DIAGNOSIS — J101 Influenza due to other identified influenza virus with other respiratory manifestations: Secondary | ICD-10-CM | POA: Diagnosis not present

## 2020-04-20 DIAGNOSIS — R059 Cough, unspecified: Secondary | ICD-10-CM | POA: Diagnosis not present

## 2020-04-20 DIAGNOSIS — R509 Fever, unspecified: Secondary | ICD-10-CM | POA: Diagnosis not present

## 2020-04-20 DIAGNOSIS — Z03818 Encounter for observation for suspected exposure to other biological agents ruled out: Secondary | ICD-10-CM | POA: Diagnosis not present

## 2020-05-09 ENCOUNTER — Ambulatory Visit: Payer: Medicare Other

## 2020-05-11 ENCOUNTER — Ambulatory Visit
Admission: RE | Admit: 2020-05-11 | Discharge: 2020-05-11 | Disposition: A | Discharge status: Home / Self Care | Payer: Medicare Other | Source: Ambulatory Visit | Attending: Family Medicine | Admitting: Family Medicine

## 2020-05-11 ENCOUNTER — Other Ambulatory Visit: Payer: Self-pay

## 2020-05-11 DIAGNOSIS — Z1231 Encounter for screening mammogram for malignant neoplasm of breast: Secondary | ICD-10-CM | POA: Diagnosis not present

## 2020-06-19 DIAGNOSIS — E782 Mixed hyperlipidemia: Secondary | ICD-10-CM | POA: Diagnosis not present

## 2020-06-19 DIAGNOSIS — Z0000 Encounter for general adult medical examination without abnormal findings: Secondary | ICD-10-CM | POA: Diagnosis not present

## 2020-06-19 DIAGNOSIS — E538 Deficiency of other specified B group vitamins: Secondary | ICD-10-CM | POA: Diagnosis not present

## 2020-06-19 DIAGNOSIS — R059 Cough, unspecified: Secondary | ICD-10-CM | POA: Diagnosis not present

## 2020-09-18 DIAGNOSIS — E782 Mixed hyperlipidemia: Secondary | ICD-10-CM | POA: Diagnosis not present

## 2020-09-18 DIAGNOSIS — I10 Essential (primary) hypertension: Secondary | ICD-10-CM | POA: Diagnosis not present

## 2020-09-18 DIAGNOSIS — K219 Gastro-esophageal reflux disease without esophagitis: Secondary | ICD-10-CM | POA: Diagnosis not present

## 2020-09-18 DIAGNOSIS — Z86010 Personal history of colonic polyps: Secondary | ICD-10-CM | POA: Diagnosis not present

## 2021-03-18 DIAGNOSIS — M85851 Other specified disorders of bone density and structure, right thigh: Secondary | ICD-10-CM | POA: Diagnosis not present

## 2021-03-18 DIAGNOSIS — R7309 Other abnormal glucose: Secondary | ICD-10-CM | POA: Diagnosis not present

## 2021-03-18 DIAGNOSIS — R7301 Impaired fasting glucose: Secondary | ICD-10-CM | POA: Diagnosis not present

## 2021-03-18 DIAGNOSIS — E782 Mixed hyperlipidemia: Secondary | ICD-10-CM | POA: Diagnosis not present

## 2021-03-18 DIAGNOSIS — E538 Deficiency of other specified B group vitamins: Secondary | ICD-10-CM | POA: Diagnosis not present

## 2021-03-25 DIAGNOSIS — K219 Gastro-esophageal reflux disease without esophagitis: Secondary | ICD-10-CM | POA: Diagnosis not present

## 2021-03-25 DIAGNOSIS — E782 Mixed hyperlipidemia: Secondary | ICD-10-CM | POA: Diagnosis not present

## 2021-03-25 DIAGNOSIS — I10 Essential (primary) hypertension: Secondary | ICD-10-CM | POA: Diagnosis not present

## 2021-03-25 DIAGNOSIS — Z0000 Encounter for general adult medical examination without abnormal findings: Secondary | ICD-10-CM | POA: Diagnosis not present

## 2021-03-25 DIAGNOSIS — M85851 Other specified disorders of bone density and structure, right thigh: Secondary | ICD-10-CM | POA: Diagnosis not present

## 2021-03-25 DIAGNOSIS — Z1389 Encounter for screening for other disorder: Secondary | ICD-10-CM | POA: Diagnosis not present

## 2021-04-02 ENCOUNTER — Other Ambulatory Visit: Payer: Self-pay | Admitting: Family Medicine

## 2021-04-02 DIAGNOSIS — Z1231 Encounter for screening mammogram for malignant neoplasm of breast: Secondary | ICD-10-CM

## 2021-04-03 DIAGNOSIS — M79641 Pain in right hand: Secondary | ICD-10-CM | POA: Diagnosis not present

## 2021-04-03 DIAGNOSIS — M1811 Unilateral primary osteoarthritis of first carpometacarpal joint, right hand: Secondary | ICD-10-CM | POA: Diagnosis not present

## 2021-05-13 ENCOUNTER — Ambulatory Visit
Admission: RE | Admit: 2021-05-13 | Discharge: 2021-05-13 | Disposition: A | Discharge status: Home / Self Care | Payer: Medicare Other | Source: Ambulatory Visit | Attending: Family Medicine | Admitting: Family Medicine

## 2021-05-13 DIAGNOSIS — Z1231 Encounter for screening mammogram for malignant neoplasm of breast: Secondary | ICD-10-CM | POA: Diagnosis not present

## 2021-05-15 ENCOUNTER — Other Ambulatory Visit: Payer: Self-pay | Admitting: Family Medicine

## 2021-05-15 DIAGNOSIS — R928 Other abnormal and inconclusive findings on diagnostic imaging of breast: Secondary | ICD-10-CM

## 2021-06-19 ENCOUNTER — Ambulatory Visit
Admission: RE | Admit: 2021-06-19 | Discharge: 2021-06-19 | Disposition: A | Discharge status: Home / Self Care | Payer: Medicare Other | Source: Ambulatory Visit | Attending: Family Medicine | Admitting: Family Medicine

## 2021-06-19 ENCOUNTER — Other Ambulatory Visit: Payer: Self-pay

## 2021-06-19 ENCOUNTER — Other Ambulatory Visit: Payer: Self-pay | Admitting: Family Medicine

## 2021-06-19 DIAGNOSIS — R928 Other abnormal and inconclusive findings on diagnostic imaging of breast: Secondary | ICD-10-CM

## 2021-06-19 DIAGNOSIS — R922 Inconclusive mammogram: Secondary | ICD-10-CM | POA: Diagnosis not present

## 2021-06-19 DIAGNOSIS — N6310 Unspecified lump in the right breast, unspecified quadrant: Secondary | ICD-10-CM

## 2021-06-25 ENCOUNTER — Ambulatory Visit
Admission: RE | Admit: 2021-06-25 | Discharge: 2021-06-25 | Disposition: A | Discharge status: Home / Self Care | Payer: Medicare Other | Source: Ambulatory Visit | Attending: Family Medicine | Admitting: Family Medicine

## 2021-06-25 DIAGNOSIS — C50811 Malignant neoplasm of overlapping sites of right female breast: Secondary | ICD-10-CM | POA: Diagnosis not present

## 2021-06-25 DIAGNOSIS — N6312 Unspecified lump in the right breast, upper inner quadrant: Secondary | ICD-10-CM | POA: Diagnosis not present

## 2021-06-25 DIAGNOSIS — N6310 Unspecified lump in the right breast, unspecified quadrant: Secondary | ICD-10-CM

## 2021-06-25 HISTORY — PX: BREAST BIOPSY: SHX20

## 2021-06-27 ENCOUNTER — Telehealth: Payer: Self-pay | Admitting: Hematology

## 2021-06-27 NOTE — Telephone Encounter (Signed)
Spoke to patient to confirm afternoon clinic appointment for 2/8, packet sent via mail

## 2021-07-01 ENCOUNTER — Other Ambulatory Visit: Payer: Medicare Other

## 2021-07-01 ENCOUNTER — Encounter: Payer: Self-pay | Admitting: *Deleted

## 2021-07-01 DIAGNOSIS — C50811 Malignant neoplasm of overlapping sites of right female breast: Secondary | ICD-10-CM

## 2021-07-01 HISTORY — DX: Malignant neoplasm of overlapping sites of right female breast: C50.811

## 2021-07-01 NOTE — Progress Notes (Signed)
Radiation Oncology         [phone] ________________________________  Name: Jasmine Swanson        MRN: [id number]  Date of Service: 07/03/2021 DOB: February 08, 1941  NW:[id number], Abigail Butts, MD  Donnie Mesa, MD     REFERRING PHYSICIAN: Donnie Mesa, MD   DIAGNOSIS: The encounter diagnosis was Malignant neoplasm of overlapping sites of right female breast, unspecified estrogen receptor status ([place]).   HISTORY OF PRESENT ILLNESS: Jasmine Swanson is a 81 y.o. female seen in the multidisciplinary breast clinic for a new diagnosis of right breast cancer. She was found to have an asymmetry in the right breast on screening mammogram.  On diagnostic imaging in the 3 o'clock position there was an irregular mass measuring up to 4 mm in greatest dimension.  The right axilla was negative for adenopathy.  She underwent a biopsy on 06/25/2021 that showed a grade 1 invasive ductal carcinoma. Her tumor was ER/PR positive, HER2 negative with a Ki 67 of 10%.  She is seen today to discuss treatment recommendations of her cancer.    PREVIOUS RADIATION THERAPY: No   PAST MEDICAL HISTORY:  Past Medical History:  Diagnosis Date   Anxiety    Arthritis    Bundle branch block, left    Complication of anesthesia    laryngeal spams 50 yrs ago   Depression    Dysphagia    Essential hypertension 06/17/2013   GERD (gastroesophageal reflux disease)    Hyperlipidemia    Kyphosis    PAC (premature atrial contraction) 06/17/2013   PAT (paroxysmal atrial tachycardia) ([place]) 06/17/2013   Plantar fasciitis of right foot    Preop cardiovascular exam 10/16/2017   Primary localized osteoarthrosis of right shoulder 11/10/2017   Right shoulder pain 01/20/2012       PAST SURGICAL HISTORY: Past Surgical History:  Procedure Laterality Date   ABDOMINAL HYSTERECTOMY     bladder tac     CARDIAC CATHETERIZATION     TONSILLECTOMY     TOTAL SHOULDER ARTHROPLASTY Right 11/10/2017   Procedure: TOTAL SHOULDER ARTHROPLASTY;   Surgeon: Marchia Bond, MD;  Location: [place];  Service: Orthopedics;  Laterality: Right;     FAMILY HISTORY:  Family History  Problem Relation Age of Onset   Hyperlipidemia Mother    Hypertension Mother    Heart attack Mother    Cancer - Colon Father    Sudden death Neg Hx    Diabetes Neg Hx      SOCIAL HISTORY:  reports that she has quit smoking. She has never used smokeless tobacco. She reports current alcohol use. She reports that she does not use drugs. The patient is divorced and lives in [place].  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   ALLERGIES: Benicar hct [olmesartan medoxomil-hctz], Codeine, Diovan [valsartan], Niacin and related, and Solu-medrol [methylprednisolone]   MEDICATIONS:  Current Outpatient Medications  Medication Sig Dispense Refill   acetaminophen (TYLENOL) 650 MG CR tablet Take 650 mg by mouth every 8 (eight) hours as needed for pain.     Ascorbic Acid (VITAMIN C) 1000 MG tablet Take 1,000 mg by mouth daily.     aspirin EC 81 MG tablet Take 81 mg by mouth every Monday, Wednesday, and Friday.     Calcium Carb-Cholecalciferol (CALCIUM-VITAMIN D) 600-400 MG-UNIT TABS Take 1 tablet by mouth daily.     Cholecalciferol 4000 units CAPS Take 4,000  Units by mouth daily.     escitalopram (LEXAPRO) 10 MG tablet Take 10 mg by mouth every evening.      fish oil-omega-3 fatty acids 1000 MG capsule Take 1 g by mouth daily.      Flaxseed, Linseed, (FLAXSEED OIL) 1000 MG CAPS Take 1,000 mg by mouth daily.     gabapentin (NEURONTIN) 100 MG capsule Take 1 capsule (100 mg total) by mouth 4 (four) times daily. 30 capsule 3   Magnesium 250 MG TABS Take 250 mg by mouth daily.      metoprolol (TOPROL-XL) 200 MG 24 hr tablet Take 200 mg by mouth every evening.      omeprazole (PRILOSEC) 20 MG capsule Take 20 mg by mouth every other day.     pyridOXINE (VITAMIN B-6) 100 MG tablet  Take 100 mg by mouth daily.     rosuvastatin (CRESTOR) 20 MG tablet Take 20 mg by mouth every evening.      No current facility-administered medications for this visit.     REVIEW OF SYSTEMS: On review of systems, the patient reports that she is doing well overall. She has been having tenderness of the breast since her biopsy and has noticed bruising. No other complaints are verbalized.      PHYSICAL EXAM:  Wt Readings from Last 3 Encounters:  11/10/17 134 lb (60.8 kg)  10/30/17 134 lb 6.4 oz (61 kg)  10/23/17 133 lb (60.3 kg)   Temp Readings from Last 3 Encounters:  11/11/17 98.5 F (36.9 C) (Oral)  01/21/12 98.4 F (36.9 C) (Oral)   BP Readings from Last 3 Encounters:  11/11/17 (!) 116/53  10/23/17 126/70  06/17/13 (!) 209/84   Pulse Readings from Last 3 Encounters:  11/11/17 80  10/23/17 (!) 59  06/17/13 (!) 110    In general this is a well appearing Caucasian female in no acute distress. She's alert and oriented x4 and appropriate throughout the examination. Cardiopulmonary assessment is negative for acute distress and she exhibits normal effort. Bilateral breast exam is deferred.    ECOG = 0  0 - Asymptomatic (Fully active, able to carry on all predisease activities without restriction)  1 - Symptomatic but completely ambulatory (Restricted in physically strenuous activity but ambulatory and able to carry out work of a light or sedentary nature. For example, light housework, office work)  2 - Symptomatic, <50% in bed during the day (Ambulatory and capable of all self care but unable to carry out any work activities. Up and about more than 50% of waking hours)  3 - Symptomatic, >50% in bed, but not bedbound (Capable of only limited self-care, confined to bed or chair 50% or more of waking hours)  4 - Bedbound (Completely disabled. Cannot carry on any self-care. Totally confined to bed or chair)  5 - Death   Eustace Pen MM, Creech RH, Tormey DC, et al. [phone]).  "Toxicity and response criteria of the [facility]". [place] Oncol. 5 (6): 649-55    LABORATORY DATA:  Lab Results  Component Value Date   WBC 7.2 11/11/2017   HGB 11.3 (L) 11/11/2017   HCT 33.3 (L) 11/11/2017   MCV 91.0 11/11/2017   PLT 155 11/11/2017   Lab Results  Component Value Date   NA 137 11/11/2017   K 3.5 11/11/2017   CL 106 11/11/2017   CO2 24 11/11/2017   No results found for: ALT, AST, GGT, ALKPHOS, BILITOT    RADIOGRAPHY: US BREAST LTD UNI  RIGHT INC AXILLA  Result Date: 06/19/2021 CLINICAL DATA:  The patient was called back for a right breast mass EXAM: DIGITAL DIAGNOSTIC UNILATERAL RIGHT MAMMOGRAM WITH TOMOSYNTHESIS AND CAD; ULTRASOUND RIGHT BREAST LIMITED TECHNIQUE: Right digital diagnostic mammography and breast tomosynthesis was performed. The images were evaluated with computer-aided detection.; Targeted ultrasound examination of the right breast was performed COMPARISON:  Previous exam(s). ACR Breast Density Category c: The breast tissue is heterogeneously dense, which may obscure small masses. FINDINGS: The mass in the right breast on the MLO view remains. On screening mammography, it appears mildly spiculated. It is not definitely spiculated based on today's spot imaging. It is seen only on the MLO view near the level the nipple. It is medially located based on 3D slices. On physical exam, no suspicious lumps are identified. Targeted ultrasound is performed, showing a solid mass in the right breast at 3 o'clock, 6 cm the nipple likely correlating with the mammographically identified mass. There is internal blood flow. This irregular mass measures 3 by 4 x 4 mm. No axillary adenopathy. IMPRESSION: New solid irregular mass in the right breast at 3 o'clock, 6 cm from the nipple on ultrasound. No axillary adenopathy. RECOMMENDATION: Recommend ultrasound-guided biopsy of the right breast mass at 3 o'clock. I have discussed the findings and  recommendations with the patient. If applicable, a reminder letter will be sent to the patient regarding the next appointment. BI-RADS CATEGORY  4: Suspicious. Electronically Signed   By: Dorise Bullion III M.D.   On: 06/19/2021 14:17  MM DIAG BREAST TOMO UNI RIGHT  Result Date: 06/19/2021 CLINICAL DATA:  The patient was called back for a right breast mass EXAM: DIGITAL DIAGNOSTIC UNILATERAL RIGHT MAMMOGRAM WITH TOMOSYNTHESIS AND CAD; ULTRASOUND RIGHT BREAST LIMITED TECHNIQUE: Right digital diagnostic mammography and breast tomosynthesis was performed. The images were evaluated with computer-aided detection.; Targeted ultrasound examination of the right breast was performed COMPARISON:  Previous exam(s). ACR Breast Density Category c: The breast tissue is heterogeneously dense, which may obscure small masses. FINDINGS: The mass in the right breast on the MLO view remains. On screening mammography, it appears mildly spiculated. It is not definitely spiculated based on today's spot imaging. It is seen only on the MLO view near the level the nipple. It is medially located based on 3D slices. On physical exam, no suspicious lumps are identified. Targeted ultrasound is performed, showing a solid mass in the right breast at 3 o'clock, 6 cm the nipple likely correlating with the mammographically identified mass. There is internal blood flow. This irregular mass measures 3 by 4 x 4 mm. No axillary adenopathy. IMPRESSION: New solid irregular mass in the right breast at 3 o'clock, 6 cm from the nipple on ultrasound. No axillary adenopathy. RECOMMENDATION: Recommend ultrasound-guided biopsy of the right breast mass at 3 o'clock. I have discussed the findings and recommendations with the patient. If applicable, a reminder letter will be sent to the patient regarding the next appointment. BI-RADS CATEGORY  4: Suspicious. Electronically Signed   By: Dorise Bullion III M.D.   On: 06/19/2021 14:17  MM CLIP PLACEMENT  RIGHT  Result Date: 06/25/2021 CLINICAL DATA:  Status post right breast ultrasound-guided biopsy. EXAM: 3D DIAGNOSTIC RIGHT MAMMOGRAM POST ULTRASOUND BIOPSY COMPARISON:  Previous exam(s). FINDINGS: 3D Mammographic images were obtained following ultrasound guided biopsy of the right breast. The biopsy marking clip is in expected position at the site of biopsy. IMPRESSION: Appropriate positioning of the ribbon shaped biopsy marking clip at the site of biopsy  in the medial right breast. Final Assessment: Post Procedure Mammograms for Marker Placement Electronically Signed   By: Kristopher Oppenheim M.D.   On: 06/25/2021 13:36  [place] RT BREAST BX W LOC DEV 1ST LESION IMG BX SPEC US GUIDE  Addendum Date: 06/28/2021   ADDENDUM REPORT: 06/28/2021 07:58 ADDENDUM: Pathology revealed GRADE I INVASIVE DUCTAL CARCINOMA WITH CALCIFICATIONS of the RIGHT breast, 3 o'clock, 6cmfn (ribbon clip). This was found to be concordant by Dr. Kristopher Oppenheim. Pathology results were discussed with the patient by telephone. The patient reported doing well after the biopsy with tenderness at the site. Post biopsy instructions and care were reviewed and questions were answered. The patient was encouraged to call [facility] for any additional concerns. The patient was referred to [facility] at [facility] on July 03, 2021. Pathology results reported by Stacie Acres RN on 06/27/2021. Electronically Signed   By: Kristopher Oppenheim M.D.   On: 06/28/2021 07:58   Result Date: 06/28/2021 CLINICAL DATA:  81 year old female with a suspicious right breast mass. EXAM: ULTRASOUND GUIDED RIGHT BREAST CORE NEEDLE BIOPSY COMPARISON:  Previous exam(s). PROCEDURE: I met with the patient and we discussed the procedure of ultrasound-guided biopsy, including benefits and alternatives. We discussed the high likelihood of a successful procedure. We discussed the risks of the procedure,  including infection, bleeding, tissue injury, clip migration, and inadequate sampling. Informed written consent was given. The usual time-out protocol was performed immediately prior to the procedure. Lesion quadrant: Upper inner quadrant Using sterile technique and 1% Lidocaine as local anesthetic, under direct ultrasound visualization, a 14 gauge spring-loaded device was used to perform biopsy of a mass at the 3 o'clock position using a inferior approach. At the conclusion of the procedure a ribbon shaped tissue marker clip was deployed into the biopsy cavity. Follow up 2 view mammogram was performed and dictated separately. IMPRESSION: Ultrasound guided biopsy of the right breast. No apparent complications. Electronically Signed: By: Kristopher Oppenheim M.D. On: 06/25/2021 13:35      IMPRESSION/PLAN: 1. Stage IA, [id number], grade 1-2, ER/PR positive invasive ductal carcinoma of the right breast. Dr. Lisbeth Renshaw discusses the pathology findings and reviews the nature of early stage right breast disease. The consensus from the breast conference includes breast conservation with lumpectomy. Dr. Lisbeth Renshaw discusses the rationale for external radiotherapy to the breast  to reduce risks of local recurrence followed by antiestrogen therapy. Dr. Lisbeth Renshaw also discusses cases in which radiation may be optional for favorable cases based on final pathology.  We discussed the risks, benefits, short, and long term effects of radiotherapy, as well as the curative intent, and the patient is interested in proceeding as she desires an aggressive approach to her care. Dr. Lisbeth Renshaw discusses the delivery and logistics of radiotherapy and anticipates a course of 4 weeks of radiotherapy to the right breast. We will see her back a few weeks after surgery to discuss the simulation process and anticipate we starting radiotherapy about 4-6 weeks after surgery.  2. Possible genetic predisposition to malignancy. The patient is a candidate to discuss  genetic testing given her personal history. She was offered referral and will meet with our genetics team today to review options of testing.    In a visit lasting 60 minutes, greater than 50% of the time was spent face to face reviewing her case, as well as in preparation of, discussing, and coordinating the patient's care.  The above documentation reflects  my direct findings during this shared patient visit. Please see the separate note by Dr. Lisbeth Renshaw on this date for the remainder of the patient's plan of care.    Carola Rhine, [facility]    **Disclaimer: This note was dictated with voice recognition software. Similar sounding words can inadvertently be transcribed and this note may contain transcription errors which may not have been corrected upon publication of note.**

## 2021-07-02 ENCOUNTER — Other Ambulatory Visit: Payer: Self-pay | Admitting: *Deleted

## 2021-07-02 DIAGNOSIS — Z170 Estrogen receptor positive status [ER+]: Secondary | ICD-10-CM | POA: Insufficient documentation

## 2021-07-02 DIAGNOSIS — C50211 Malignant neoplasm of upper-inner quadrant of right female breast: Secondary | ICD-10-CM | POA: Insufficient documentation

## 2021-07-02 HISTORY — DX: Estrogen receptor positive status (ER+): C50.211

## 2021-07-02 HISTORY — DX: Estrogen receptor positive status (ER+): Z17.0

## 2021-07-03 ENCOUNTER — Ambulatory Visit (HOSPITAL_BASED_OUTPATIENT_CLINIC_OR_DEPARTMENT_OTHER): Payer: Medicare Other | Admitting: Genetic Counselor

## 2021-07-03 ENCOUNTER — Ambulatory Visit
Admission: RE | Admit: 2021-07-03 | Discharge: 2021-07-03 | Disposition: A | Discharge status: Home / Self Care | Payer: Medicare Other | Source: Ambulatory Visit | Attending: Radiation Oncology | Admitting: Radiation Oncology

## 2021-07-03 ENCOUNTER — Ambulatory Visit: Payer: Self-pay | Admitting: Surgery

## 2021-07-03 ENCOUNTER — Other Ambulatory Visit: Payer: Self-pay

## 2021-07-03 ENCOUNTER — Inpatient Hospital Stay: Payer: Medicare Other | Attending: Hematology

## 2021-07-03 ENCOUNTER — Encounter: Payer: Self-pay | Admitting: Hematology

## 2021-07-03 ENCOUNTER — Encounter: Payer: Self-pay | Admitting: *Deleted

## 2021-07-03 ENCOUNTER — Ambulatory Visit: Payer: Medicare Other | Admitting: Physical Therapy

## 2021-07-03 ENCOUNTER — Inpatient Hospital Stay (HOSPITAL_BASED_OUTPATIENT_CLINIC_OR_DEPARTMENT_OTHER): Payer: Medicare Other | Admitting: Hematology

## 2021-07-03 VITALS — BP 185/50 | HR 76 | Temp 98.1°F | Resp 16 | Ht 62.0 in | Wt 134.0 lb

## 2021-07-03 DIAGNOSIS — F411 Generalized anxiety disorder: Secondary | ICD-10-CM

## 2021-07-03 DIAGNOSIS — R1310 Dysphagia, unspecified: Secondary | ICD-10-CM | POA: Insufficient documentation

## 2021-07-03 DIAGNOSIS — Z90710 Acquired absence of both cervix and uterus: Secondary | ICD-10-CM | POA: Diagnosis not present

## 2021-07-03 DIAGNOSIS — Z860100 Personal history of colon polyps, unspecified: Secondary | ICD-10-CM

## 2021-07-03 DIAGNOSIS — Z170 Estrogen receptor positive status [ER+]: Secondary | ICD-10-CM

## 2021-07-03 DIAGNOSIS — G8929 Other chronic pain: Secondary | ICD-10-CM

## 2021-07-03 DIAGNOSIS — Z800 Family history of malignant neoplasm of digestive organs: Secondary | ICD-10-CM

## 2021-07-03 DIAGNOSIS — C50811 Malignant neoplasm of overlapping sites of right female breast: Secondary | ICD-10-CM | POA: Diagnosis not present

## 2021-07-03 DIAGNOSIS — C4491 Basal cell carcinoma of skin, unspecified: Secondary | ICD-10-CM

## 2021-07-03 DIAGNOSIS — M85851 Other specified disorders of bone density and structure, right thigh: Secondary | ICD-10-CM

## 2021-07-03 DIAGNOSIS — Z87891 Personal history of nicotine dependence: Secondary | ICD-10-CM | POA: Insufficient documentation

## 2021-07-03 DIAGNOSIS — I10 Essential (primary) hypertension: Secondary | ICD-10-CM | POA: Insufficient documentation

## 2021-07-03 DIAGNOSIS — G2581 Restless legs syndrome: Secondary | ICD-10-CM

## 2021-07-03 DIAGNOSIS — C50211 Malignant neoplasm of upper-inner quadrant of right female breast: Secondary | ICD-10-CM

## 2021-07-03 DIAGNOSIS — I447 Left bundle-branch block, unspecified: Secondary | ICD-10-CM

## 2021-07-03 DIAGNOSIS — K219 Gastro-esophageal reflux disease without esophagitis: Secondary | ICD-10-CM

## 2021-07-03 DIAGNOSIS — R432 Parageusia: Secondary | ICD-10-CM | POA: Insufficient documentation

## 2021-07-03 DIAGNOSIS — C50911 Malignant neoplasm of unspecified site of right female breast: Secondary | ICD-10-CM

## 2021-07-03 HISTORY — DX: Basal cell carcinoma of skin, unspecified: C44.91

## 2021-07-03 HISTORY — DX: Generalized anxiety disorder: F41.1

## 2021-07-03 HISTORY — DX: Restless legs syndrome: G25.81

## 2021-07-03 HISTORY — DX: Left bundle-branch block, unspecified: I44.7

## 2021-07-03 HISTORY — DX: Parageusia: R43.2

## 2021-07-03 HISTORY — DX: Other specified disorders of bone density and structure, right thigh: M85.851

## 2021-07-03 HISTORY — DX: Personal history of colon polyps, unspecified: Z86.0100

## 2021-07-03 HISTORY — DX: Other chronic pain: G89.29

## 2021-07-03 HISTORY — DX: Gastro-esophageal reflux disease without esophagitis: K21.9

## 2021-07-03 LAB — CBC WITH DIFFERENTIAL (CANCER CENTER ONLY)
Abs Immature Granulocytes: 0.04 10*3/uL (ref 0.00–0.07)
Basophils Absolute: 0 10*3/uL (ref 0.0–0.1)
Basophils Relative: 0 %
Eosinophils Absolute: 0.3 10*3/uL (ref 0.0–0.5)
Eosinophils Relative: 3 %
HCT: 36.7 % (ref 36.0–46.0)
Hemoglobin: 12.6 g/dL (ref 12.0–15.0)
Immature Granulocytes: 1 %
Lymphocytes Relative: 24 %
Lymphs Abs: 1.7 10*3/uL (ref 0.7–4.0)
MCH: 32.1 pg (ref 26.0–34.0)
MCHC: 34.3 g/dL (ref 30.0–36.0)
MCV: 93.4 fL (ref 80.0–100.0)
Monocytes Absolute: 0.6 10*3/uL (ref 0.1–1.0)
Monocytes Relative: 8 %
Neutro Abs: 4.6 10*3/uL (ref 1.7–7.7)
Neutrophils Relative %: 64 %
Platelet Count: 187 10*3/uL (ref 150–400)
RBC: 3.93 MIL/uL (ref 3.87–5.11)
RDW: 12.1 % (ref 11.5–15.5)
WBC Count: 7.3 10*3/uL (ref 4.0–10.5)
nRBC: 0 % (ref 0.0–0.2)

## 2021-07-03 LAB — CMP (CANCER CENTER ONLY)
ALT: 26 U/L (ref 0–44)
AST: 32 U/L (ref 15–41)
Albumin: 4.5 g/dL (ref 3.5–5.0)
Alkaline Phosphatase: 48 U/L (ref 38–126)
Anion gap: 8 (ref 5–15)
BUN: 10 mg/dL (ref 8–23)
CO2: 28 mmol/L (ref 22–32)
Calcium: 9 mg/dL (ref 8.9–10.3)
Chloride: 103 mmol/L (ref 98–111)
Creatinine: 0.9 mg/dL (ref 0.44–1.00)
GFR, Estimated: >60 mL/min (ref >60)
Glucose, Bld: 161 mg/dL — ABNORMAL HIGH (ref 70–99)
Potassium: 3.9 mmol/L (ref 3.5–5.1)
Sodium: 139 mmol/L (ref 135–145)
Total Bilirubin: 0.5 mg/dL (ref 0.3–1.2)
Total Protein: 7.2 g/dL (ref 6.5–8.1)

## 2021-07-03 LAB — GENETIC SCREENING ORDER

## 2021-07-03 NOTE — Progress Notes (Addendum)
[place]   Telephone:([phone]) [phone] Fax:([phone]) [place] Note   Patient Care Team: Cari Caraway, MD as PCP - General (Family Medicine) Cari Caraway, MD as Consulting Physician (Family Medicine) Donnie Mesa, MD as Consulting Physician (General Surgery) Truitt Merle, MD as Consulting Physician (Hematology) Kyung Rudd, MD as Consulting Physician (Radiation Oncology) Rockwell Germany, RN as Oncology Nurse Navigator Mauro Kaufmann, RN as Oncology Nurse Navigator  Date of Service:  07/03/2021   CHIEF COMPLAINTS/PURPOSE OF CONSULTATION:  Right Breast Cancer, ER+  REFERRING PHYSICIAN:  [facility]   ASSESSMENT & PLAN:  LORETTO Swanson is a 81 y.o. postmenopausal female with a history of   1. 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 Right diagnostic MM and [place] on 06/19/21 showed a 4 mm mass at 3 o'clock. Biopsy on 06/25/21 showed IDC with calcifications. --We discussed her imaging findings and the biopsy results in great details. -Given the early stage disease, she likely need a lumpectomy. She is agreeable with that. She was seen by Dr. Georgette Dover today and likely will proceed with surgery soon. SLN biopsy is not recommended due to her advanced age and the very early stage disease -I do not recommend an Oncotype Dx test, given her age and low grade disease. -we discussed that any additional treatment is to reduce her risk of future recurrence, which likely will be low.   -Giving the strong ER and PR expression in her postmenopausal status, I discussed that adjuvant endocrine therapy with aromatase inhibitor or Tamoxifen for a total of 5 years would reduce the risk of cancer recurrence. Potential benefits and side effects were discussed with patient.  Due to her arthralgia, I think she can tolerate tamoxifen better.  She is interested. -Due to her advanced age  and very early stage disease, if she does not tolerate tamoxifen, I have a low threshold to stop it. No adjuvant therapy however is also very reasonable.  -She was also seen by radiation oncologist Dr. Lisbeth Renshaw today. She could get some benefit from breast radiation if she undergo lumpectomy to decrease the risk of breast cancer.  -We also discussed the breast cancer surveillance after her surgery. She will continue annual screening mammogram, self exam, and a routine office visit with lab and exam with [place]. -I encouraged her to have healthy diet and exercise regularly.   2. Bone Health  -Her most recent DEXA was normal, per pt.   PLAN:  -proceed with lumpectomy -I will see her back after surgery or radiation to review her adjuvant tamoxifen, and probably will let her try if she can tolerate.   Oncology History Overview Note   Cancer Staging  Malignant neoplasm of overlapping sites of right female breast [facility]) Staging form: Breast, AJCC 8th Edition - Clinical stage from 06/25/2021: Stage IA (cT1a, cN0, cM0, G1, ER+, PR+, HER2-) - Signed by Truitt Merle, MD on 07/02/2021    Malignant neoplasm of overlapping sites of right female breast (Jasmine Swanson)  06/19/2021 Imaging   EXAM: DIGITAL DIAGNOSTIC UNILATERAL RIGHT MAMMOGRAM WITH TOMOSYNTHESIS AND CAD; ULTRASOUND RIGHT BREAST LIMITED  FINDINGS: The mass in the right breast on the MLO view remains. On screening mammography, it appears mildly spiculated. It is not definitely spiculated based on today's spot imaging. It is seen only on the MLO view near the level the nipple. It is medially located based on 3D slices.   On  physical exam, no suspicious lumps are identified.   Targeted ultrasound is performed, showing a solid mass in the right breast at 3 o'clock, 6 cm the nipple likely correlating with the mammographically identified mass. There is internal blood flow. This irregular mass measures 3 by 4 x 4 mm. No axillary adenopathy.   IMPRESSION: New solid  irregular mass in the right breast at 3 o'clock, 6 cm from the nipple on ultrasound. No axillary adenopathy.   06/25/2021 Cancer Staging   Staging form: Breast, AJCC 8th Edition - Clinical stage from 06/25/2021: Stage IA (cT1a, cN0, cM0, G1, ER+, PR+, HER2-) - Signed by Truitt Merle, MD on 07/02/2021 Stage prefix: Initial diagnosis Histologic grading system: 3 grade system    06/25/2021 Initial Biopsy   Diagnosis Breast, right, needle core biopsy, 3 o'clock, 6cmfn - INVASIVE DUCTAL CARCINOMA WITH CALCIFICATIONS - SEE COMMENT Microscopic Comment Based on the biopsy, the carcinoma appears Nottingham grade 1 of 3 and measures 0.4 cm in greatest linear extent.  PROGNOSTIC INDICATORS Results: Estrogen Receptor: 100%, POSITIVE, STRONG STAINING INTENSITY Progesterone Receptor: 80%, POSITIVE, MODERATE STAINING INTENSITY Proliferation Marker Ki67: 10%  FLUORESCENCE IN-SITU HYBRIDIZATION Results: GROUP 5: HER2 **NEGATIVE**   07/01/2021 Initial Diagnosis   Malignant neoplasm of overlapping sites of right female breast (HCC)      HISTORY OF PRESENTING ILLNESS:  Jasmine Swanson 81 y.o. female is a here because of breast cancer. The patient was referred by [facility]. The patient presents to the clinic today accompanied by her daughter.   She had routine screening mammography on 05/13/21 showing a possible abnormality in the right breast. She underwent right diagnostic mammography and right breast ultrasonography on 06/19/21 showing: 4 mm mass at 3 o'clock.  Biopsy on 06/25/21 showed: invasive ductal carcinoma with calcifications, grade 1. Prognostic indicators significant for: estrogen receptor, 100% positive and progesterone receptor, 80% positive. Proliferation marker Ki67 at 10%. HER2 negative.   Today the patient notes they felt/feeling prior/after... -she has bruising from the biopsy, no pain. She endorses using ice pack.  She has a PMHx of.... 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  -bladder tack in place  Socially... 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 -previously smoked 0.5-0.75 ppd for 15 years   GYN HISTORY  Menarchal: "in high school" (~42-60 years old) LMP: age 28, with hysterectomy Contraceptive: HRT:  GP: 1   REVIEW OF SYSTEMS:    Constitutional: Denies fevers, chills or abnormal night sweats Eyes: Denies blurriness of vision, double vision or watery eyes Ears, nose, mouth, throat, and face: Denies mucositis or sore throat Respiratory: Denies cough, dyspnea or wheezes Cardiovascular: Denies palpitation, chest discomfort or lower extremity swelling Gastrointestinal:  Denies nausea, heartburn or change in bowel habits Skin: Denies abnormal skin rashes Lymphatics: Denies new lymphadenopathy or easy bruising Neurological:Denies numbness, tingling or new weaknesses Behavioral/Psych: Mood is stable, no new changes  All other systems were reviewed with the patient and are negative.   MEDICAL HISTORY:  Past Medical History:  Diagnosis Date   Anxiety    Arthritis    Bundle branch block, left    Complication of anesthesia    laryngeal spams 50 yrs ago   Depression    Dysphagia    Essential hypertension 06/17/2013   GERD (gastroesophageal reflux disease)    Hyperlipidemia    Kyphosis  PAC (premature atrial contraction) 06/17/2013   PAT (paroxysmal atrial tachycardia) ([place]) 06/17/2013   Plantar fasciitis of right foot    Preop cardiovascular exam 10/16/2017   Primary localized osteoarthrosis of right shoulder 11/10/2017   Right shoulder pain 01/20/2012    SURGICAL HISTORY: Past Surgical History:  Procedure Laterality Date   ABDOMINAL HYSTERECTOMY     bladder tac     CARDIAC CATHETERIZATION     TONSILLECTOMY     TOTAL SHOULDER ARTHROPLASTY Right 11/10/2017    Procedure: TOTAL SHOULDER ARTHROPLASTY;  Surgeon: Marchia Bond, MD;  Location: [place];  Service: Orthopedics;  Laterality: Right;    SOCIAL HISTORY: Social History   Socioeconomic History   Marital status: Divorced    Spouse name: Not on file   Number of children: 1   Years of education: Not on file   Highest education level: Not on file  Occupational History   Not on file  Tobacco Use   Smoking status: Former    Packs/day: 0.50    Years: 15.00    Pack years: 7.50    Types: Cigarettes   Smokeless tobacco: Never  Vaping Use   Vaping Use: Never used  Substance and Sexual Activity   Alcohol use: Yes    Comment: occ   Drug use: No   Sexual activity: Not on file  Other Topics Concern   Not on file  Social History Narrative   Not on file   Social Determinants of Health   Financial Resource Strain: Not on file  Food Insecurity: Not on file  Transportation Needs: Not on file  Physical Activity: Not on file  Stress: Not on file  Social Connections: Not on file  Intimate Partner Violence: Not on file    FAMILY HISTORY: Family History  Problem Relation Age of Onset   Hyperlipidemia Mother    Hypertension Mother    Heart attack Mother    Cancer - Colon Father    Sudden death Neg Hx    Diabetes Neg Hx     ALLERGIES:  is allergic to benicar hct [olmesartan medoxomil-hctz], codeine, diovan [valsartan], niacin and related, and solu-medrol [methylprednisolone].  MEDICATIONS:  Current Outpatient Medications  Medication Sig Dispense Refill   acetaminophen (TYLENOL) 650 MG CR tablet Take 650 mg by mouth every 8 (eight) hours as needed for pain.     Ascorbic Acid (VITAMIN C) 1000 MG tablet Take 1,000 mg by mouth daily.     aspirin EC 81 MG tablet Take 81 mg by mouth every Monday, Wednesday, and Friday.     Calcium Carb-Cholecalciferol (CALCIUM-VITAMIN D) 600-400 MG-UNIT TABS Take 1 tablet by mouth daily.     Cholecalciferol 4000 units CAPS Take 4,000 Units by mouth daily.      escitalopram (LEXAPRO) 10 MG tablet Take 10 mg by mouth every evening.      fish oil-omega-3 fatty acids 1000 MG capsule Take 1 g by mouth daily.      Flaxseed, Linseed, (FLAXSEED OIL) 1000 MG CAPS Take 1,000 mg by mouth daily.     gabapentin (NEURONTIN) 100 MG capsule Take 1 capsule (100 mg total) by mouth 4 (four) times daily. 30 capsule 3   Magnesium 250 MG TABS Take 250 mg by mouth daily.      metoprolol (TOPROL-XL) 200 MG 24 hr tablet Take 200 mg by mouth every evening.      omeprazole (PRILOSEC) 20 MG capsule Take 20 mg by mouth every other day.     pyridOXINE (VITAMIN B-6)  100 MG tablet Take 100 mg by mouth daily.     rosuvastatin (CRESTOR) 20 MG tablet Take 20 mg by mouth every evening.      No current facility-administered medications for this visit.    PHYSICAL EXAMINATION: ECOG PERFORMANCE STATUS: 0 - Asymptomatic  Vitals:   07/03/21 1254  BP: (!) 185/50  Pulse: 76  Resp: 16  Temp: 98.1 F (36.7 C)  SpO2: 97%   Filed Weights   07/03/21 1254  Weight: 134 lb (60.8 kg)    GENERAL:alert, no distress and comfortable SKIN: skin color, texture, turgor are normal, no rashes or significant lesions EYES: normal, Conjunctiva are pink and non-injected, sclera clear  NECK: supple, thyroid normal size, non-tender, without nodularity LYMPH:  no palpable lymphadenopathy in the cervical, axillary  LUNGS: clear to auscultation and percussion with normal breathing effort HEART: regular rate & rhythm and no murmurs and no lower extremity edema ABDOMEN:abdomen soft, non-tender and normal bowel sounds Musculoskeletal:no cyanosis of digits and no clubbing  NEURO: alert & oriented x 3 with fluent speech, no focal motor/sensory deficits BREAST: (+) bruising in right breast from biopsy. No palpable mass, nodules or adenopathy bilaterally. Breast exam benign.  LABORATORY DATA:  I have reviewed the data as listed CBC Latest Ref Rng & Units 07/03/2021 11/11/2017 11/10/2017  WBC 4.0 - 10.5  K/uL 7.3 7.2 7.2  Hemoglobin 12.0 - 15.0 g/dL 12.6 11.3(L) 12.2  Hematocrit 36.0 - 46.0 % 36.7 33.3(L) 37.1  Platelets 150 - 400 K/uL 187 155 149(L)    CMP Latest Ref Rng & Units 07/03/2021 11/11/2017 11/10/2017  Glucose 70 - 99 mg/dL 161(H) 199(H) -  BUN 8 - 23 mg/dL 10 10 -  Creatinine 0.44 - 1.00 mg/dL 0.90 0.72 0.76  Sodium 135 - 145 mmol/L 139 137 -  Potassium 3.5 - 5.1 mmol/L 3.9 3.5 -  Chloride 98 - 111 mmol/L 103 106 -  CO2 22 - 32 mmol/L 28 24 -  Calcium 8.9 - 10.3 mg/dL 9.0 8.5(L) -  Total Protein 6.5 - 8.1 g/dL 7.2 - -  Total Bilirubin 0.3 - 1.2 mg/dL 0.5 - -  Alkaline Phos 38 - 126 U/L 48 - -  AST 15 - 41 U/L 32 - -  ALT 0 - 44 U/L 26 - -     RADIOGRAPHIC STUDIES: I have personally reviewed the radiological images as listed and agreed with the findings in the report. US BREAST LTD UNI RIGHT INC AXILLA  Result Date: 06/19/2021 CLINICAL DATA:  The patient was called back for a right breast mass EXAM: DIGITAL DIAGNOSTIC UNILATERAL RIGHT MAMMOGRAM WITH TOMOSYNTHESIS AND CAD; ULTRASOUND RIGHT BREAST LIMITED TECHNIQUE: Right digital diagnostic mammography and breast tomosynthesis was performed. The images were evaluated with computer-aided detection.; Targeted ultrasound examination of the right breast was performed COMPARISON:  Previous exam(s). ACR Breast Density Category c: The breast tissue is heterogeneously dense, which may obscure small masses. FINDINGS: The mass in the right breast on the MLO view remains. On screening mammography, it appears mildly spiculated. It is not definitely spiculated based on today's spot imaging. It is seen only on the MLO view near the level the nipple. It is medially located based on 3D slices. On physical exam, no suspicious lumps are identified. Targeted ultrasound is performed, showing a solid mass in the right breast at 3 o'clock, 6 cm the nipple likely correlating with the mammographically identified mass. There is internal blood flow. This  irregular mass measures 3 by 4 x 4 mm.  No axillary adenopathy. IMPRESSION: New solid irregular mass in the right breast at 3 o'clock, 6 cm from the nipple on ultrasound. No axillary adenopathy. RECOMMENDATION: Recommend ultrasound-guided biopsy of the right breast mass at 3 o'clock. I have discussed the findings and recommendations with the patient. If applicable, a reminder letter will be sent to the patient regarding the next appointment. BI-RADS CATEGORY  4: Suspicious. Electronically Signed   By: Dorise Bullion III M.D.   On: 06/19/2021 14:17  MM DIAG BREAST TOMO UNI RIGHT  Result Date: 06/19/2021 CLINICAL DATA:  The patient was called back for a right breast mass EXAM: DIGITAL DIAGNOSTIC UNILATERAL RIGHT MAMMOGRAM WITH TOMOSYNTHESIS AND CAD; ULTRASOUND RIGHT BREAST LIMITED TECHNIQUE: Right digital diagnostic mammography and breast tomosynthesis was performed. The images were evaluated with computer-aided detection.; Targeted ultrasound examination of the right breast was performed COMPARISON:  Previous exam(s). ACR Breast Density Category c: The breast tissue is heterogeneously dense, which may obscure small masses. FINDINGS: The mass in the right breast on the MLO view remains. On screening mammography, it appears mildly spiculated. It is not definitely spiculated based on today's spot imaging. It is seen only on the MLO view near the level the nipple. It is medially located based on 3D slices. On physical exam, no suspicious lumps are identified. Targeted ultrasound is performed, showing a solid mass in the right breast at 3 o'clock, 6 cm the nipple likely correlating with the mammographically identified mass. There is internal blood flow. This irregular mass measures 3 by 4 x 4 mm. No axillary adenopathy. IMPRESSION: New solid irregular mass in the right breast at 3 o'clock, 6 cm from the nipple on ultrasound. No axillary adenopathy. RECOMMENDATION: Recommend ultrasound-guided biopsy of the right breast  mass at 3 o'clock. I have discussed the findings and recommendations with the patient. If applicable, a reminder letter will be sent to the patient regarding the next appointment. BI-RADS CATEGORY  4: Suspicious. Electronically Signed   By: Dorise Bullion III M.D.   On: 06/19/2021 14:17  MM CLIP PLACEMENT RIGHT  Result Date: 06/25/2021 CLINICAL DATA:  Status post right breast ultrasound-guided biopsy. EXAM: 3D DIAGNOSTIC RIGHT MAMMOGRAM POST ULTRASOUND BIOPSY COMPARISON:  Previous exam(s). FINDINGS: 3D Mammographic images were obtained following ultrasound guided biopsy of the right breast. The biopsy marking clip is in expected position at the site of biopsy. IMPRESSION: Appropriate positioning of the ribbon shaped biopsy marking clip at the site of biopsy in the medial right breast. Final Assessment: Post Procedure Mammograms for Marker Placement Electronically Signed   By: Kristopher Oppenheim M.D.   On: 06/25/2021 13:36  [place] RT BREAST BX W LOC DEV 1ST LESION IMG BX SPEC US GUIDE  Addendum Date: 06/28/2021   ADDENDUM REPORT: 06/28/2021 07:58 ADDENDUM: Pathology revealed GRADE I INVASIVE DUCTAL CARCINOMA WITH CALCIFICATIONS of the RIGHT breast, 3 o'clock, 6cmfn (ribbon clip). This was found to be concordant by Dr. Kristopher Oppenheim. Pathology results were discussed with the patient by telephone. The patient reported doing well after the biopsy with tenderness at the site. Post biopsy instructions and care were reviewed and questions were answered. The patient was encouraged to call [facility] for any additional concerns. The patient was referred to [facility] on July 03, 2021. Pathology results reported by Stacie Acres RN on 06/27/2021. Electronically Signed   By: Kristopher Oppenheim M.D.   On: 06/28/2021 07:58   Result Date: 06/28/2021 CLINICAL  DATA:  81 year old female with a suspicious right breast mass. EXAM:  ULTRASOUND GUIDED RIGHT BREAST CORE NEEDLE BIOPSY COMPARISON:  Previous exam(s). PROCEDURE: I met with the patient and we discussed the procedure of ultrasound-guided biopsy, including benefits and alternatives. We discussed the high likelihood of a successful procedure. We discussed the risks of the procedure, including infection, bleeding, tissue injury, clip migration, and inadequate sampling. Informed written consent was given. The usual time-out protocol was performed immediately prior to the procedure. Lesion quadrant: Upper inner quadrant Using sterile technique and 1% Lidocaine as local anesthetic, under direct ultrasound visualization, a 14 gauge spring-loaded device was used to perform biopsy of a mass at the 3 o'clock position using a inferior approach. At the conclusion of the procedure a ribbon shaped tissue marker clip was deployed into the biopsy cavity. Follow up 2 view mammogram was performed and dictated separately. IMPRESSION: Ultrasound guided biopsy of the right breast. No apparent complications. Electronically Signed: By: Kristopher Oppenheim M.D. On: 06/25/2021 13:35    No orders of the defined types were placed in this encounter.   All questions were answered. The patient knows to call the clinic with any problems, questions or concerns. The total time spent in the appointment was 50 minutes.     Truitt Merle, MD 07/03/2021   I, Wilburn Mylar, am acting as scribe for Truitt Merle, MD.   I have reviewed the above documentation for accuracy and completeness, and I agree with the above.

## 2021-07-03 NOTE — H&P (View-Only) (Signed)
Subjective   Chief Complaint: Breast Cancer   Breast MDC 07/03/21 Feng/ [facility]  History of Present Illness: Jasmine Swanson is a 81 y.o. female who is seen today as an office consultation at the request of Dr. Addison Lank for evaluation of Breast Cancer .   This is an 81 year old female who presents after routine screening mammogram revealed a possible right breast mass.  She underwent further work-up which revealed a mass in the right breast at 3:00 located 6 cm from the nipple.  This measured 3 x 4 x 4 mm.  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  The axilla appeared negative.  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  Review of Systems: A complete review of systems was obtained from the patient.  I have reviewed this information and discussed as appropriate with the patient.  See HPI as well for other ROS.  Review of Systems  Constitutional: Negative.   HENT: Negative.   Eyes: Positive for blurred vision.  Respiratory: Negative.   Cardiovascular: Negative.   Gastrointestinal: Negative.   Genitourinary: Negative.   Musculoskeletal: Positive for joint pain.  Skin: Negative.   Neurological: Negative.   Endo/Heme/Allergies: Bruises/bleeds easily.  Psychiatric/Behavioral: Negative.       Medical History: Past Medical History:  Diagnosis Date   Arthritis    GERD (gastroesophageal reflux disease)     Patient Active Problem List  Diagnosis   Basal cell carcinoma   Chronic pain   Dysphagia   Malignant neoplasm of upper-inner quadrant of right breast in female, estrogen receptor positive (CMS-HCC)   Osteopenia of right thigh   PAC (premature atrial contraction)   Parageusia   PAT (paroxysmal atrial tachycardia) (CMS-HCC)   Personal history of colonic polyps   Localized, primary osteoarthritis of hand   Restless legs syndrome   Primary osteoarthritis   Left bundle branch block   Hereditary and  idiopathic neuropathy, unspecified   Generalized anxiety disorder   Gastro-esophageal reflux disease without esophagitis   Essential hypertension    Past Surgical History:  Procedure Laterality Date   HYSTERECTOMY     JOINT REPLACEMENT     TONSILLECTOMY       Allergies  Allergen Reactions   Codeine Other (See Comments)    Drops blood pressure Drops blood pressure    Niacin Other (See Comments)    Turns patient red, makes her pass out   Olmesartan Dizziness   Olmesartan-Hydrochlorothiazide Other (See Comments)    Edema of lower extremities  Edema of lower extremities     Valsartan Other (See Comments)    Palpitations.   Palpitations.      Methylprednisolone Rash    Increased HR and shaking all over  Increased HR and shaking all over      Current Outpatient Medications on File Prior to Visit  Medication Sig Dispense Refill   acetaminophen (TYLENOL) 650 MG ER tablet 1 tablet as needed for pain     ascorbic acid, vitamin C, (VITAMIN C) 1000 MG tablet Take 1,000 mg by mouth     aspirin 81 MG EC tablet Take by mouth     calcium carbonate-vit D3-min 600 mg calcium- 400 unit Tab Take 1 tablet by mouth once daily     escitalopram oxalate (LEXAPRO) 10 MG tablet escitalopram 10 mg tablet  TAKE 1 TABLET BY MOUTH ONCE DAILY     flaxseed oiL 1,000 mg Cap Take 1,000 mg by mouth  gabapentin (NEURONTIN) 100 MG capsule Take by mouth    ° magnesium 250 mg Tab Take by mouth    ° metoprolol succinate (TOPROL-XL) 200 MG XL tablet metoprolol succinate ER 200 mg tablet,extended release 24 hr ° TAKE 1 TABLET BY MOUTH ONCE DAILY    ° omeprazole (PRILOSEC) 20 MG DR capsule Take 20 mg by mouth every other day    ° pyridoxine, vitamin B6, (B-6) 100 MG tablet 1 tablet    ° rosuvastatin (CRESTOR) 20 MG tablet rosuvastatin 20 mg tablet ° TAKE 1 TABLET BY MOUTH ONCE DAILY FOR 90 DAYS    ° °No current facility-administered medications on file prior to visit.  ° ° °Family History  °Problem Relation Age  of Onset  ° High blood pressure (Hypertension) Mother   ° Hyperlipidemia (Elevated cholesterol) Mother   ° Coronary Artery Disease (Blocked arteries around heart) Mother   ° Colon cancer Father   °  ° °Social History  ° °Tobacco Use  °Smoking Status Former  ° Years: 30.00  ° Types: Cigarettes  °Smokeless Tobacco Never  °  ° °Social History  ° °Socioeconomic History  ° Marital status: Married  °Tobacco Use  ° Smoking status: Former  °  Years: 30.00  °  Types: Cigarettes  ° Smokeless tobacco: Never  °Vaping Use  ° Vaping Use: Never used  °Substance and Sexual Activity  ° Alcohol use: Yes  °  Comment: ! per week  ° Drug use: Never  ° ° °Objective:  ° °Physical Exam  ° °Constitutional:  WDWN in NAD, conversant, no obvious deformities; lying in bed comfortably °Eyes:  Pupils equal, round; sclera anicteric; moist conjunctiva; no lid lag °HENT:  Oral mucosa moist; good dentition  °Neck:  No masses palpated, trachea midline; no thyromegaly °Lungs:  CTA bilaterally; normal respiratory effort °Breasts:  symmetric, chronic nipple retraction; no palpable masses or lymphadenopathy on either side °CV:  Regular rate and rhythm; no murmurs; extremities well-perfused with no edema °Abd:  +bowel sounds, soft, non-tender, no palpable organomegaly; no palpable hernias °Musc:  Unable to assess gait; no apparent clubbing or cyanosis in extremities °Lymphatic:  No palpable cervical or axillary lymphadenopathy °Skin:  Warm, dry; no sign of jaundice °Psychiatric - alert and oriented x 4; calm mood and affect ° ° °Labs, Imaging and Diagnostic Testing: °Diagnosis °Breast, right, needle core biopsy, 3 o'clock, 6cmfn °- INVASIVE DUCTAL CARCINOMA WITH CALCIFICATIONS °- SEE COMMENT °Microscopic Comment °Based on the biopsy, the carcinoma appears Nottingham grade 1 of 3 and measures 0.4 cm in greatest linear extent. °Prognostic markers (ER/PR/ki-67/HER2) are pending and will be reported in an addendum. Dr. Kashikar reviewed the °case and agrees  with the above diagnosis. These results were called to [facility] on February °2, 2023. °DAWN BUTLER MD °Pathologist, Electronic Signature °(Case signed 06/26/2021) ° °CLINICAL DATA:  Screening. °  °EXAM: °DIGITAL SCREENING BILATERAL MAMMOGRAM WITH TOMOSYNTHESIS AND CAD °  °TECHNIQUE: °Bilateral screening digital craniocaudal and mediolateral oblique °mammograms were obtained. Bilateral screening digital breast °tomosynthesis was performed. The images were evaluated with °computer-aided detection. °  °COMPARISON:  Previous exam(s). °  °ACR Breast Density Category d: The breast tissue is extremely dense, °which lowers the sensitivity of mammography. °  °FINDINGS: °In the right breast, a possible asymmetry warrants further °evaluation. In the left breast, no findings suspicious for °malignancy. °  °IMPRESSION: °Further evaluation is suggested for possible asymmetry in the right °breast. °  °RECOMMENDATION: °Diagnostic mammogram and possibly ultrasound of the   right breast. (Code:[id number]-26M)   The patient will be contacted regarding the findings, and additional imaging will be scheduled.   BI-RADS CATEGORY  0: Incomplete. Need additional imaging evaluation and/or prior mammograms for comparison.     Electronically Signed   By: Lovey Newcomer M.D.   On: 05/14/2021 08:41  CLINICAL DATA:  The patient was called back for a right breast mass   EXAM: DIGITAL DIAGNOSTIC UNILATERAL RIGHT MAMMOGRAM WITH TOMOSYNTHESIS AND CAD; ULTRASOUND RIGHT BREAST LIMITED   TECHNIQUE: Right digital diagnostic mammography and breast tomosynthesis was performed. The images were evaluated with computer-aided detection.; Targeted ultrasound examination of the right breast was performed   COMPARISON:  Previous exam(s).   ACR Breast Density Category c: The breast tissue is heterogeneously dense, which may obscure small masses.   FINDINGS: The mass in the right breast on the MLO view remains. On  screening mammography, it appears mildly spiculated. It is not definitely spiculated based on today's spot imaging. It is seen only on the MLO view near the level the nipple. It is medially located based on 3D slices.   On physical exam, no suspicious lumps are identified.   Targeted ultrasound is performed, showing a solid mass in the right breast at 3 o'clock, 6 cm the nipple likely correlating with the mammographically identified mass. There is internal blood flow. This irregular mass measures 3 by 4 x 4 mm. No axillary adenopathy.   IMPRESSION: New solid irregular mass in the right breast at 3 o'clock, 6 cm from the nipple on ultrasound. No axillary adenopathy.   RECOMMENDATION: Recommend ultrasound-guided biopsy of the right breast mass at 3 o'clock.   I have discussed the findings and recommendations with the patient. If applicable, a reminder letter will be sent to the patient regarding the next appointment.   BI-RADS CATEGORY  4: Suspicious.     Electronically Signed   By: Dorise Bullion III M.D.   On: 06/19/2021 14:17  Assessment and Plan:  Diagnoses and all orders for this visit:  Malignant neoplasm of upper-inner quadrant of right breast in female, estrogen receptor positive (CMS-HCC)  After thorough discussion with the patient, her daughter, and the rest of her care team, we have decided to proceed with breast conserving therapy with lumpectomy followed by radiation and antiestrogens.  Right radioactive seed localized lumpectomy.The surgical procedure has been discussed with the patient.  Potential risks, benefits, alternative treatments, and expected outcomes have been explained.  All of the patient's questions at this time have been answered.  The likelihood of reaching the patient's treatment goal is good.  The patient understand the proposed surgical procedure and wishes to proceed.     Carlean Jews, MD  07/03/2021 4:06 PM

## 2021-07-03 NOTE — H&P (Signed)
Subjective   Chief Complaint: Breast Cancer   Breast MDC 07/03/21 Feng/ [facility]  History of Present Illness: Jasmine Swanson is a 81 y.o. female who is seen today as an office consultation at the request of Dr. Addison Lank for evaluation of Breast Cancer .   This is an 81 year old female who presents after routine screening mammogram revealed a possible right breast mass.  She underwent further work-up which revealed a mass in the right breast at 3:00 located 6 cm from the nipple.  This measured 3 x 4 x 4 mm.  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  The axilla appeared negative.  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  Review of Systems: A complete review of systems was obtained from the patient.  I have reviewed this information and discussed as appropriate with the patient.  See HPI as well for other ROS.  Review of Systems  Constitutional: Negative.   HENT: Negative.   Eyes: Positive for blurred vision.  Respiratory: Negative.   Cardiovascular: Negative.   Gastrointestinal: Negative.   Genitourinary: Negative.   Musculoskeletal: Positive for joint pain.  Skin: Negative.   Neurological: Negative.   Endo/Heme/Allergies: Bruises/bleeds easily.  Psychiatric/Behavioral: Negative.       Medical History: Past Medical History:  Diagnosis Date   Arthritis    GERD (gastroesophageal reflux disease)     Patient Active Problem List  Diagnosis   Basal cell carcinoma   Chronic pain   Dysphagia   Malignant neoplasm of upper-inner quadrant of right breast in female, estrogen receptor positive (CMS-HCC)   Osteopenia of right thigh   PAC (premature atrial contraction)   Parageusia   PAT (paroxysmal atrial tachycardia) (CMS-HCC)   Personal history of colonic polyps   Localized, primary osteoarthritis of hand   Restless legs syndrome   Primary osteoarthritis   Left bundle branch block   Hereditary and  idiopathic neuropathy, unspecified   Generalized anxiety disorder   Gastro-esophageal reflux disease without esophagitis   Essential hypertension    Past Surgical History:  Procedure Laterality Date   HYSTERECTOMY     JOINT REPLACEMENT     TONSILLECTOMY       Allergies  Allergen Reactions   Codeine Other (See Comments)    Drops blood pressure Drops blood pressure    Niacin Other (See Comments)    Turns patient red, makes her pass out   Olmesartan Dizziness   Olmesartan-Hydrochlorothiazide Other (See Comments)    Edema of lower extremities  Edema of lower extremities     Valsartan Other (See Comments)    Palpitations.   Palpitations.      Methylprednisolone Rash    Increased HR and shaking all over  Increased HR and shaking all over      Current Outpatient Medications on File Prior to Visit  Medication Sig Dispense Refill   acetaminophen (TYLENOL) 650 MG ER tablet 1 tablet as needed for pain     ascorbic acid, vitamin C, (VITAMIN C) 1000 MG tablet Take 1,000 mg by mouth     aspirin 81 MG EC tablet Take by mouth     calcium carbonate-vit D3-min 600 mg calcium- 400 unit Tab Take 1 tablet by mouth once daily     escitalopram oxalate (LEXAPRO) 10 MG tablet escitalopram 10 mg tablet  TAKE 1 TABLET BY MOUTH ONCE DAILY     flaxseed oiL 1,000 mg Cap Take 1,000 mg by mouth  gabapentin (NEURONTIN) 100 MG capsule Take by mouth     magnesium 250 mg Tab Take by mouth     metoprolol succinate (TOPROL-XL) 200 MG XL tablet metoprolol succinate ER 200 mg tablet,extended release 24 hr  TAKE 1 TABLET BY MOUTH ONCE DAILY     omeprazole (PRILOSEC) 20 MG DR capsule Take 20 mg by mouth every other day     pyridoxine, vitamin B6, (B-6) 100 MG tablet 1 tablet     rosuvastatin (CRESTOR) 20 MG tablet rosuvastatin 20 mg tablet  TAKE 1 TABLET BY MOUTH ONCE DAILY FOR 90 DAYS     No current facility-administered medications on file prior to visit.    Family History  Problem Relation Age  of Onset   High blood pressure (Hypertension) Mother    Hyperlipidemia (Elevated cholesterol) Mother    Coronary Artery Disease (Blocked arteries around heart) Mother    Colon cancer Father      Social History   Tobacco Use  Smoking Status Former   Years: 30.00   Types: Cigarettes  Smokeless Tobacco Never     Social History   Socioeconomic History   Marital status: Married  Tobacco Use   Smoking status: Former    Years: 30.00    Types: Cigarettes   Smokeless tobacco: Never  Vaping Use   Vaping Use: Never used  Substance and Sexual Activity   Alcohol use: Yes    Comment: ! per week   Drug use: Never    Objective:   Physical Exam   Constitutional:  WDWN in NAD, conversant, no obvious deformities; lying in bed comfortably Eyes:  Pupils equal, round; sclera anicteric; moist conjunctiva; no lid lag HENT:  Oral mucosa moist; good dentition  Neck:  No masses palpated, trachea midline; no thyromegaly Lungs:  CTA bilaterally; normal respiratory effort Breasts:  symmetric, chronic nipple retraction; no palpable masses or lymphadenopathy on either side CV:  Regular rate and rhythm; no murmurs; extremities well-perfused with no edema Abd:  +bowel sounds, soft, non-tender, no palpable organomegaly; no palpable hernias Musc:  Unable to assess gait; no apparent clubbing or cyanosis in extremities Lymphatic:  No palpable cervical or axillary lymphadenopathy Skin:  Warm, dry; no sign of jaundice Psychiatric - alert and oriented x 4; calm mood and affect   Labs, Imaging and Diagnostic Testing: Diagnosis Breast, right, needle core biopsy, 3 o'clock, 6cmfn - INVASIVE DUCTAL CARCINOMA WITH CALCIFICATIONS - SEE COMMENT Microscopic Comment Based on the biopsy, the carcinoma appears Nottingham grade 1 of 3 and measures 0.4 cm in greatest linear extent. Prognostic markers (ER/PR/ki-67/HER2) are pending and will be reported in an addendum. Dr. Vic Ripper reviewed the case and agrees  with the above diagnosis. These results were called to [facility] on June 27, 2021. Thressa Sheller MD Pathologist, Electronic Signature (Case signed 06/26/2021)  CLINICAL DATA:  Screening.   EXAM: DIGITAL SCREENING BILATERAL MAMMOGRAM WITH TOMOSYNTHESIS AND CAD   TECHNIQUE: Bilateral screening digital craniocaudal and mediolateral oblique mammograms were obtained. Bilateral screening digital breast tomosynthesis was performed. The images were evaluated with computer-aided detection.   COMPARISON:  Previous exam(s).   ACR Breast Density Category d: The breast tissue is extremely dense, which lowers the sensitivity of mammography.   FINDINGS: In the right breast, a possible asymmetry warrants further evaluation. In the left breast, no findings suspicious for malignancy.   IMPRESSION: Further evaluation is suggested for possible asymmetry in the right breast.   RECOMMENDATION: Diagnostic mammogram and possibly ultrasound of the  right breast. (Code:[id number])   The patient will be contacted regarding the findings, and additional imaging will be scheduled.   BI-RADS CATEGORY  0: Incomplete. Need additional imaging evaluation and/or prior mammograms for comparison.     Electronically Signed   By: Lovey Newcomer M.D.   On: 05/14/2021 08:41  CLINICAL DATA:  The patient was called back for a right breast mass   EXAM: DIGITAL DIAGNOSTIC UNILATERAL RIGHT MAMMOGRAM WITH TOMOSYNTHESIS AND CAD; ULTRASOUND RIGHT BREAST LIMITED   TECHNIQUE: Right digital diagnostic mammography and breast tomosynthesis was performed. The images were evaluated with computer-aided detection.; Targeted ultrasound examination of the right breast was performed   COMPARISON:  Previous exam(s).   ACR Breast Density Category c: The breast tissue is heterogeneously dense, which may obscure small masses.   FINDINGS: The mass in the right breast on the MLO view remains. On  screening mammography, it appears mildly spiculated. It is not definitely spiculated based on today's spot imaging. It is seen only on the MLO view near the level the nipple. It is medially located based on 3D slices.   On physical exam, no suspicious lumps are identified.   Targeted ultrasound is performed, showing a solid mass in the right breast at 3 o'clock, 6 cm the nipple likely correlating with the mammographically identified mass. There is internal blood flow. This irregular mass measures 3 by 4 x 4 mm. No axillary adenopathy.   IMPRESSION: New solid irregular mass in the right breast at 3 o'clock, 6 cm from the nipple on ultrasound. No axillary adenopathy.   RECOMMENDATION: Recommend ultrasound-guided biopsy of the right breast mass at 3 o'clock.   I have discussed the findings and recommendations with the patient. If applicable, a reminder letter will be sent to the patient regarding the next appointment.   BI-RADS CATEGORY  4: Suspicious.     Electronically Signed   By: Dorise Bullion III M.D.   On: 06/19/2021 14:17  Assessment and Plan:  Diagnoses and all orders for this visit:  Malignant neoplasm of upper-inner quadrant of right breast in female, estrogen receptor positive (CMS-HCC)  After thorough discussion with the patient, her daughter, and the rest of her care team, we have decided to proceed with breast conserving therapy with lumpectomy followed by radiation and antiestrogens.  Right radioactive seed localized lumpectomy.The surgical procedure has been discussed with the patient.  Potential risks, benefits, alternative treatments, and expected outcomes have been explained.  All of the patient's questions at this time have been answered.  The likelihood of reaching the patient's treatment goal is good.  The patient understand the proposed surgical procedure and wishes to proceed.     Carlean Jews, MD  07/03/2021 4:06 PM

## 2021-07-04 ENCOUNTER — Encounter: Payer: Self-pay | Admitting: Genetic Counselor

## 2021-07-04 ENCOUNTER — Encounter: Payer: Self-pay | Admitting: *Deleted

## 2021-07-04 ENCOUNTER — Other Ambulatory Visit: Payer: Self-pay | Admitting: Surgery

## 2021-07-04 DIAGNOSIS — C50811 Malignant neoplasm of overlapping sites of right female breast: Secondary | ICD-10-CM

## 2021-07-04 DIAGNOSIS — C50911 Malignant neoplasm of unspecified site of right female breast: Secondary | ICD-10-CM

## 2021-07-04 DIAGNOSIS — Z170 Estrogen receptor positive status [ER+]: Secondary | ICD-10-CM

## 2021-07-04 NOTE — Progress Notes (Signed)
REFERRING PROVIDER: Truitt Merle, MD [address] [place], [place] [address]  PRIMARY PROVIDER:  Cari Caraway, MD  PRIMARY REASON FOR VISIT:  1. Malignant neoplasm of overlapping sites of right breast in female, estrogen receptor positive ([place])   2. Family history of colon cancer in father     HISTORY OF PRESENT ILLNESS:   [name], a 81 y.o. female, was seen for a [facility] cancer genetics consultation during the breast multidisciplinary clinic at the request of Dr. Burr Medico due to a personal and family history of cancer.  [name] presents to clinic today to discuss the possibility of a hereditary predisposition to cancer, to discuss genetic testing, and to further clarify her future cancer risks, as well as potential cancer risks for family members.   In January 2023, at the age of 68, [name] was diagnosed with invasive ductal carcinoma of the right breast.   CANCER HISTORY:  Oncology History Overview Note   Cancer Staging  Malignant neoplasm of overlapping sites of right female breast [facility]) Staging form: Breast, AJCC 8th Edition - Clinical stage from 06/25/2021: Stage IA (cT1a, cN0, cM0, G1, ER+, PR+, HER2-) - Signed by Truitt Merle, MD on 07/02/2021    Malignant neoplasm of overlapping sites of right female breast ([place])  06/19/2021 Imaging   EXAM: DIGITAL DIAGNOSTIC UNILATERAL RIGHT MAMMOGRAM WITH TOMOSYNTHESIS AND CAD; ULTRASOUND RIGHT BREAST LIMITED  FINDINGS: The mass in the right breast on the MLO view remains. On screening mammography, it appears mildly spiculated. It is not definitely spiculated based on today's spot imaging. It is seen only on the MLO view near the level the nipple. It is medially located based on 3D slices.   On physical exam, no suspicious lumps are identified.   Targeted ultrasound is performed, showing a solid mass in the right breast at 3 o'clock, 6 cm the nipple likely correlating with the mammographically identified mass. There is  internal blood flow. This irregular mass measures 3 by 4 x 4 mm. No axillary adenopathy.   IMPRESSION: New solid irregular mass in the right breast at 3 o'clock, 6 cm from the nipple on ultrasound. No axillary adenopathy.   06/25/2021 Cancer Staging   Staging form: Breast, AJCC 8th Edition - Clinical stage from 06/25/2021: Stage IA (cT1a, cN0, cM0, G1, ER+, PR+, HER2-) - Signed by Truitt Merle, MD on 07/02/2021 Stage prefix: Initial diagnosis Histologic grading system: 3 grade system    06/25/2021 Initial Biopsy   Diagnosis Breast, right, needle core biopsy, 3 o'clock, 6cmfn - INVASIVE DUCTAL CARCINOMA WITH CALCIFICATIONS - SEE COMMENT Microscopic Comment Based on the biopsy, the carcinoma appears Nottingham grade 1 of 3 and measures 0.4 cm in greatest linear extent.  PROGNOSTIC INDICATORS Results: Estrogen Receptor: 100%, POSITIVE, STRONG STAINING INTENSITY Progesterone Receptor: 80%, POSITIVE, MODERATE STAINING INTENSITY Proliferation Marker Ki67: 10%  FLUORESCENCE IN-SITU HYBRIDIZATION Results: GROUP 5: HER2 **NEGATIVE**   07/01/2021 Initial Diagnosis   Malignant neoplasm of overlapping sites of right female breast ([place])      RISK FACTORS:   First live birth at age 2.  OCP use for approximately 0 years.  Ovaries intact: yes.  Uterus intact: no.  Menopausal status: postmenopausal.  HRT use: yes, unsure how long Colonoscopy: yes Mammogram within the last year: yes. Any excessive radiation exposure in the past: no  Past Medical History:  Diagnosis Date   Anxiety    Arthritis    Bundle branch block, left    Complication of anesthesia    laryngeal  spams 50 yrs ago   Depression    Dysphagia    Essential hypertension 06/17/2013   GERD (gastroesophageal reflux disease)    Hyperlipidemia    Kyphosis    PAC (premature atrial contraction) 06/17/2013   PAT (paroxysmal atrial tachycardia) ([place]) 06/17/2013   Plantar fasciitis of right foot    Preop cardiovascular exam  10/16/2017   Primary localized osteoarthrosis of right shoulder 11/10/2017   Right shoulder pain 01/20/2012    Past Surgical History:  Procedure Laterality Date   ABDOMINAL HYSTERECTOMY     bladder tac     CARDIAC CATHETERIZATION     TONSILLECTOMY     TOTAL SHOULDER ARTHROPLASTY Right 11/10/2017   Procedure: TOTAL SHOULDER ARTHROPLASTY;  Surgeon: Marchia Bond, MD;  Location: [facility];  Service: Orthopedics;  Laterality: Right;    Social History   Socioeconomic History   Marital status: Divorced    Spouse name: Not on file   Number of children: 1   Years of education: Not on file   Highest education level: Not on file  Occupational History   Not on file  Tobacco Use   Smoking status: Former    Packs/day: 0.50    Years: 15.00    Pack years: 7.50    Types: Cigarettes   Smokeless tobacco: Never  Vaping Use   Vaping Use: Never used  Substance and Sexual Activity   Alcohol use: Yes    Comment: occ   Drug use: No   Sexual activity: Not on file  Other Topics Concern   Not on file  Social History Narrative   Not on file   Social Determinants of Health   Financial Resource Strain: Not on file  Food Insecurity: Not on file  Transportation Needs: Not on file  Physical Activity: Not on file  Stress: Not on file  Social Connections: Not on file     FAMILY HISTORY:  We obtained a detailed, 4-generation family history.  Significant diagnoses are listed below: Family History  Problem Relation Age of Onset   Hyperlipidemia Mother    Hypertension Mother    Heart attack Mother    Colon cancer Father        dx. 68s   Prostate cancer Father        dx. 90s   Breast cancer Cousin        dx. 34s, paternal   Sudden death Neg Hx    Diabetes Neg Hx      Jasmine Swanson's father was diagnosed with colon cancer in his 70s and prostate cancer in his 35s, he died at age 19. Her paternal cousin was diagnosed with breast cancer in her 64s, she is deceased. Jasmine Swanson is unaware of  previous family history of genetic testing for hereditary cancer risks. There is no reported Ashkenazi Jewish ancestry.   GENETIC COUNSELING ASSESSMENT: [name] is a 81 y.o. female with a personal and family history of cancer which is somewhat suggestive of a hereditary cancer syndrome and predisposition to cancer. We, therefore, discussed and recommended the following at today's visit.   DISCUSSION: We discussed that 5 - 10% of cancer is hereditary, with most cases of hereditary breast cancer associated with mutations in BRCA1/2.  There are other genes that can be associated with hereditary breast cancer syndromes. Type of cancer risk and level of risk are gene-specific. We discussed that testing is beneficial for several reasons including knowing how to follow individuals after completing their treatment, identifying whether potential treatment options  would be beneficial, and understanding if other family members could be at risk for cancer and allowing them to undergo genetic testing.   We reviewed the characteristics, features and inheritance patterns of hereditary cancer syndromes. We also discussed genetic testing, including the appropriate family members to test, the process of testing, insurance coverage and turn-around-time for results. We discussed the implications of a negative, positive and/or variant of uncertain significant result.  Ms. Zuch  was offered a common hereditary cancer panel (47 genes) and an expanded pan-cancer panel (77 genes). Ms. Annas was informed of the benefits and limitations of each panel, including that expanded pan-cancer panels contain genes that do not have clear management guidelines at this point in time.  We also discussed that as the number of genes included on a panel increases, the chances of variants of uncertain significance increases.  After considering the benefits and limitations of each gene panel, Ms. Grimsley  elected to have [facility]  CancerNext-Expanded Panel.   The CancerNext-Expanded gene panel offered by [facility] and includes sequencing, rearrangement, and RNA analysis for the following 77 genes: AIP, ALK, APC, ATM, AXIN2, BAP1, BARD1, BLM, BMPR1A, BRCA1, BRCA2, BRIP1, CDC73, CDH1, CDK4, CDKN1B, CDKN2A, CHEK2, CTNNA1, DICER1, FANCC, FH, FLCN, GALNT12, KIF1B, LZTR1, MAX, MEN1, MET, MLH1, MSH2, MSH3, MSH6, MUTYH, NBN, NF1, NF2, NTHL1, PALB2, PHOX2B, PMS2, POT1, PRKAR1A, PTCH1, PTEN, RAD51C, RAD51D, RB1, RECQL, RET, SDHA, SDHAF2, SDHB, SDHC, SDHD, SMAD4, SMARCA4, SMARCB1, SMARCE1, STK11, SUFU, TMEM127, TP53, TSC1, TSC2, VHL and XRCC2 (sequencing and deletion/duplication); EGFR, EGLN1, HOXB13, KIT, MITF, PDGFRA, POLD1, and POLE (sequencing only); EPCAM and GREM1 (deletion/duplication only).    Based on Ms. Bolinger's personal and family history of cancer, she does not quite meet medical criteria for genetic testing. She may have an out of pocket cost. We discussed that if her out of pocket cost for testing is over $100, the laboratory should contact them to discuss self-pay prices, patient pay assistance programs, if applicable, and other billing options.   PLAN: After considering the risks, benefits, and limitations, Ms. Heenan provided informed consent to pursue genetic testing and the blood sample was sent to [facility] for analysis of the CancerNext-Expanded Panel. Results should be available within approximately 2-3 weeks' time, at which point they will be disclosed by telephone to Ms. Haye, as will any additional recommendations warranted by these results. Ms. Wedeking will receive a summary of her genetic counseling visit and a copy of her results once available. This information will also be available in Epic.   Ms. Evangelist questions were answered to her satisfaction today. Our contact information was provided should additional questions or concerns arise. Thank you for the referral and allowing [place] to share  in the care of your patient.   Lucille Passy, MS, [facility] Genetic Counselor [email or web address] (P) [phone]  The patient was seen for a total of 20 minutes in face-to-face genetic counseling.  The patient brought her daughter. Drs. Lindi Adie and/or Burr Medico were available to discuss this case as needed.  _______________________________________________________________________ For Office Staff:  Number of people involved in session: 2 Was an Intern/ student involved with case: no

## 2021-07-05 ENCOUNTER — Telehealth: Payer: Self-pay | Admitting: Radiation Oncology

## 2021-07-05 ENCOUNTER — Encounter: Payer: Self-pay | Admitting: *Deleted

## 2021-07-05 ENCOUNTER — Encounter (HOSPITAL_BASED_OUTPATIENT_CLINIC_OR_DEPARTMENT_OTHER): Payer: Self-pay | Admitting: Surgery

## 2021-07-05 ENCOUNTER — Other Ambulatory Visit: Payer: Self-pay

## 2021-07-05 NOTE — Telephone Encounter (Signed)
LVM to sched consult

## 2021-07-08 ENCOUNTER — Ambulatory Visit
Admission: RE | Admit: 2021-07-08 | Discharge: 2021-07-08 | Disposition: A | Discharge status: Home / Self Care | Payer: Medicare Other | Source: Ambulatory Visit | Attending: Surgery | Admitting: Surgery

## 2021-07-08 ENCOUNTER — Encounter (HOSPITAL_BASED_OUTPATIENT_CLINIC_OR_DEPARTMENT_OTHER)
Admission: RE | Admit: 2021-07-08 | Discharge: 2021-07-08 | Disposition: A | Discharge status: Home / Self Care | Payer: Medicare Other | Source: Ambulatory Visit | Attending: Surgery | Admitting: Surgery

## 2021-07-08 DIAGNOSIS — C50911 Malignant neoplasm of unspecified site of right female breast: Secondary | ICD-10-CM | POA: Diagnosis not present

## 2021-07-08 DIAGNOSIS — Z01810 Encounter for preprocedural cardiovascular examination: Secondary | ICD-10-CM | POA: Insufficient documentation

## 2021-07-08 MED ORDER — CHLORHEXIDINE GLUCONATE CLOTH 2 % EX PADS: 6.0000 | MEDICATED_PAD | Freq: Once | CUTANEOUS | Status: DC

## 2021-07-08 NOTE — Progress Notes (Signed)

## 2021-07-08 NOTE — Progress Notes (Signed)
Sent text reminding pt to come in for EKG today.  

## 2021-07-09 ENCOUNTER — Encounter (HOSPITAL_BASED_OUTPATIENT_CLINIC_OR_DEPARTMENT_OTHER): Payer: Self-pay | Admitting: Surgery

## 2021-07-09 ENCOUNTER — Encounter (HOSPITAL_BASED_OUTPATIENT_CLINIC_OR_DEPARTMENT_OTHER)
Admission: RE | Disposition: A | Discharge status: Home / Self Care | Payer: Self-pay | Source: Home / Self Care | Attending: Surgery

## 2021-07-09 ENCOUNTER — Ambulatory Visit (HOSPITAL_BASED_OUTPATIENT_CLINIC_OR_DEPARTMENT_OTHER): Payer: Medicare Other | Admitting: Anesthesiology

## 2021-07-09 ENCOUNTER — Other Ambulatory Visit: Payer: Self-pay

## 2021-07-09 ENCOUNTER — Ambulatory Visit (HOSPITAL_BASED_OUTPATIENT_CLINIC_OR_DEPARTMENT_OTHER)
Admission: RE | Admit: 2021-07-09 | Discharge: 2021-07-09 | Disposition: A | Discharge status: Home / Self Care | Payer: Medicare Other | Attending: Surgery | Admitting: Surgery

## 2021-07-09 ENCOUNTER — Encounter: Payer: Self-pay | Admitting: General Practice

## 2021-07-09 ENCOUNTER — Ambulatory Visit
Admission: RE | Admit: 2021-07-09 | Discharge: 2021-07-09 | Disposition: A | Discharge status: Home / Self Care | Payer: Medicare Other | Source: Ambulatory Visit | Attending: Surgery | Admitting: Surgery

## 2021-07-09 DIAGNOSIS — Z87891 Personal history of nicotine dependence: Secondary | ICD-10-CM | POA: Diagnosis not present

## 2021-07-09 DIAGNOSIS — M1990 Unspecified osteoarthritis, unspecified site: Secondary | ICD-10-CM | POA: Insufficient documentation

## 2021-07-09 DIAGNOSIS — Z170 Estrogen receptor positive status [ER+]: Secondary | ICD-10-CM | POA: Diagnosis not present

## 2021-07-09 DIAGNOSIS — N6021 Fibroadenosis of right breast: Secondary | ICD-10-CM | POA: Insufficient documentation

## 2021-07-09 DIAGNOSIS — Z79899 Other long term (current) drug therapy: Secondary | ICD-10-CM | POA: Insufficient documentation

## 2021-07-09 DIAGNOSIS — I447 Left bundle-branch block, unspecified: Secondary | ICD-10-CM | POA: Diagnosis not present

## 2021-07-09 DIAGNOSIS — K219 Gastro-esophageal reflux disease without esophagitis: Secondary | ICD-10-CM | POA: Diagnosis not present

## 2021-07-09 DIAGNOSIS — D0511 Intraductal carcinoma in situ of right breast: Secondary | ICD-10-CM | POA: Insufficient documentation

## 2021-07-09 DIAGNOSIS — C50211 Malignant neoplasm of upper-inner quadrant of right female breast: Secondary | ICD-10-CM | POA: Diagnosis not present

## 2021-07-09 DIAGNOSIS — N6011 Diffuse cystic mastopathy of right breast: Secondary | ICD-10-CM | POA: Insufficient documentation

## 2021-07-09 DIAGNOSIS — F32A Depression, unspecified: Secondary | ICD-10-CM | POA: Diagnosis not present

## 2021-07-09 DIAGNOSIS — F419 Anxiety disorder, unspecified: Secondary | ICD-10-CM | POA: Insufficient documentation

## 2021-07-09 DIAGNOSIS — C50911 Malignant neoplasm of unspecified site of right female breast: Secondary | ICD-10-CM | POA: Diagnosis not present

## 2021-07-09 DIAGNOSIS — I10 Essential (primary) hypertension: Secondary | ICD-10-CM | POA: Diagnosis not present

## 2021-07-09 DIAGNOSIS — R921 Mammographic calcification found on diagnostic imaging of breast: Secondary | ICD-10-CM | POA: Diagnosis not present

## 2021-07-09 HISTORY — PX: BREAST LUMPECTOMY: SHX2

## 2021-07-09 HISTORY — PX: BREAST LUMPECTOMY WITH RADIOACTIVE SEED LOCALIZATION: SHX6424

## 2021-07-09 SURGERY — BREAST LUMPECTOMY WITH RADIOACTIVE SEED LOCALIZATION
Anesthesia: General | Site: Breast | Laterality: Right

## 2021-07-09 MED ORDER — PHENYLEPHRINE HCL (PRESSORS) 10 MG/ML IV SOLN
INTRAVENOUS | Status: DC | PRN
Start: 2021-07-09 — End: 2021-07-09
  Administered 2021-07-09: 80 ug via INTRAVENOUS

## 2021-07-09 MED ORDER — MEPERIDINE HCL 25 MG/ML IJ SOLN: 6.2500 mg | INTRAMUSCULAR | Status: DC | PRN

## 2021-07-09 MED ORDER — FENTANYL CITRATE (PF) 100 MCG/2ML IJ SOLN
INTRAMUSCULAR | Status: DC | PRN
  Administered 2021-07-09: 50 ug via INTRAVENOUS

## 2021-07-09 MED ORDER — CEFAZOLIN SODIUM-DEXTROSE 2-4 GM/100ML-% IV SOLN
INTRAVENOUS | Status: AC
  Filled 2021-07-09: qty 100

## 2021-07-09 MED ORDER — FENTANYL CITRATE (PF) 100 MCG/2ML IJ SOLN
INTRAMUSCULAR | Status: AC
  Filled 2021-07-09: qty 2

## 2021-07-09 MED ORDER — ACETAMINOPHEN 325 MG PO TABS: 325.0000 mg | ORAL_TABLET | ORAL | Status: DC | PRN

## 2021-07-09 MED ORDER — OXYCODONE HCL 5 MG/5ML PO SOLN: 5.0000 mg | Freq: Once | ORAL | Status: DC | PRN

## 2021-07-09 MED ORDER — PROPOFOL 10 MG/ML IV BOLUS
INTRAVENOUS | Status: DC | PRN
  Administered 2021-07-09: 150 mg via INTRAVENOUS

## 2021-07-09 MED ORDER — CEFAZOLIN SODIUM-DEXTROSE 2-4 GM/100ML-% IV SOLN
2.0000 g | INTRAVENOUS | Status: AC
  Administered 2021-07-09: 2 g via INTRAVENOUS

## 2021-07-09 MED ORDER — LIDOCAINE 2% (20 MG/ML) 5 ML SYRINGE
INTRAMUSCULAR | Status: AC
  Filled 2021-07-09: qty 5

## 2021-07-09 MED ORDER — EPHEDRINE SULFATE (PRESSORS) 50 MG/ML IJ SOLN
INTRAMUSCULAR | Status: DC | PRN
  Administered 2021-07-09 (×2): 10 mg via INTRAVENOUS

## 2021-07-09 MED ORDER — ACETAMINOPHEN 500 MG PO TABS
ORAL_TABLET | ORAL | Status: AC
  Filled 2021-07-09: qty 2

## 2021-07-09 MED ORDER — ONDANSETRON HCL 4 MG/2ML IJ SOLN
INTRAMUSCULAR | Status: AC
  Filled 2021-07-09: qty 2

## 2021-07-09 MED ORDER — ACETAMINOPHEN 500 MG PO TABS
1000.0000 mg | ORAL_TABLET | ORAL | Status: AC
  Administered 2021-07-09: 1000 mg via ORAL

## 2021-07-09 MED ORDER — DEXAMETHASONE SODIUM PHOSPHATE 10 MG/ML IJ SOLN
INTRAMUSCULAR | Status: AC
  Filled 2021-07-09: qty 1

## 2021-07-09 MED ORDER — ONDANSETRON HCL 4 MG/2ML IJ SOLN: 4.0000 mg | Freq: Once | INTRAMUSCULAR | Status: DC | PRN

## 2021-07-09 MED ORDER — ACETAMINOPHEN 160 MG/5ML PO SOLN: 325.0000 mg | ORAL | Status: DC | PRN

## 2021-07-09 MED ORDER — ONDANSETRON HCL 4 MG/2ML IJ SOLN
INTRAMUSCULAR | Status: DC | PRN
  Administered 2021-07-09: 4 mg via INTRAVENOUS

## 2021-07-09 MED ORDER — LIDOCAINE 2% (20 MG/ML) 5 ML SYRINGE
INTRAMUSCULAR | Status: DC | PRN
  Administered 2021-07-09: 80 mg via INTRAVENOUS

## 2021-07-09 MED ORDER — DEXAMETHASONE SODIUM PHOSPHATE 4 MG/ML IJ SOLN
INTRAMUSCULAR | Status: DC | PRN
  Administered 2021-07-09: 4 mg via INTRAVENOUS

## 2021-07-09 MED ORDER — OXYCODONE HCL 5 MG PO TABS: 5.0000 mg | ORAL_TABLET | Freq: Once | ORAL | Status: DC | PRN

## 2021-07-09 MED ORDER — FENTANYL CITRATE (PF) 100 MCG/2ML IJ SOLN
25.0000 ug | INTRAMUSCULAR | Status: DC | PRN
  Administered 2021-07-09 (×2): 50 ug via INTRAVENOUS

## 2021-07-09 MED ORDER — BUPIVACAINE-EPINEPHRINE 0.25% -1:200000 IJ SOLN
INTRAMUSCULAR | Status: DC | PRN
  Administered 2021-07-09: 10 mL

## 2021-07-09 MED ORDER — LACTATED RINGERS IV SOLN: INTRAVENOUS | Status: DC

## 2021-07-09 SURGICAL SUPPLY — 46 items
APL PRP STRL LF DISP 70% ISPRP (MISCELLANEOUS) ×1
APL SKNCLS STERI-STRIP NONHPOA (GAUZE/BANDAGES/DRESSINGS) ×1
APPLIER CLIP 9.375 MED OPEN (MISCELLANEOUS) ×2
APR CLP MED 9.3 20 MLT OPN (MISCELLANEOUS) ×1
BENZOIN TINCTURE PRP APPL 2/3 (GAUZE/BANDAGES/DRESSINGS) ×3 IMPLANT
BLADE HEX COATED 2.75 (ELECTRODE) ×3 IMPLANT
BLADE SURG 15 STRL LF DISP TIS (BLADE) ×2 IMPLANT
BLADE SURG 15 STRL SS (BLADE) ×2
CANISTER SUC SOCK COL 7IN (MISCELLANEOUS) IMPLANT
CANISTER SUCT 1200ML W/VALVE (MISCELLANEOUS) IMPLANT
CHLORAPREP W/TINT 26 (MISCELLANEOUS) ×3 IMPLANT
CLIP APPLIE 9.375 MED OPEN (MISCELLANEOUS) ×2 IMPLANT
COVER BACK TABLE 60X90IN (DRAPES) ×3 IMPLANT
COVER MAYO STAND STRL (DRAPES) ×3 IMPLANT
COVER PROBE W GEL 5X96 (DRAPES) ×3 IMPLANT
DRAPE LAPAROTOMY 100X72 PEDS (DRAPES) ×3 IMPLANT
DRAPE UTILITY XL STRL (DRAPES) ×3 IMPLANT
DRSG TEGADERM 4X4.75 (GAUZE/BANDAGES/DRESSINGS) ×3 IMPLANT
ELECT REM PT RETURN 9FT ADLT (ELECTROSURGICAL) ×2
ELECTRODE REM PT RTRN 9FT ADLT (ELECTROSURGICAL) ×2 IMPLANT
GAUZE SPONGE 4X4 12PLY STRL LF (GAUZE/BANDAGES/DRESSINGS) ×1 IMPLANT
GLOVE SURG ENC MOIS LTX SZ7 (GLOVE) ×3 IMPLANT
GLOVE SURG POLYISO LF SZ7 (GLOVE) ×1 IMPLANT
GLOVE SURG UNDER POLY LF SZ7.5 (GLOVE) ×3 IMPLANT
GOWN STRL REUS W/ TWL LRG LVL3 (GOWN DISPOSABLE) ×4 IMPLANT
GOWN STRL REUS W/TWL LRG LVL3 (GOWN DISPOSABLE) ×4
KIT MARKER MARGIN INK (KITS) ×3 IMPLANT
NDL HYPO 25X1 1.5 SAFETY (NEEDLE) ×2 IMPLANT
NEEDLE HYPO 25X1 1.5 SAFETY (NEEDLE) ×2 IMPLANT
NS IRRIG 1000ML POUR BTL (IV SOLUTION) ×3 IMPLANT
PACK BASIN DAY SURGERY FS (CUSTOM PROCEDURE TRAY) ×3 IMPLANT
PENCIL SMOKE EVACUATOR (MISCELLANEOUS) ×3 IMPLANT
SLEEVE SCD COMPRESS KNEE MED (STOCKING) ×3 IMPLANT
SPONGE T-LAP 18X18 ~~LOC~~+RFID (SPONGE) IMPLANT
SPONGE T-LAP 4X18 ~~LOC~~+RFID (SPONGE) ×3 IMPLANT
STRIP CLOSURE SKIN 1/2X4 (GAUZE/BANDAGES/DRESSINGS) ×3 IMPLANT
SUT MON AB 4-0 PC3 18 (SUTURE) ×3 IMPLANT
SUT SILK 2 0 SH (SUTURE) IMPLANT
SUT VIC AB 3-0 SH 27 (SUTURE) ×2
SUT VIC AB 3-0 SH 27X BRD (SUTURE) ×2 IMPLANT
SYR BULB EAR ULCER 3OZ GRN STR (SYRINGE) IMPLANT
SYR CONTROL 10ML LL (SYRINGE) ×3 IMPLANT
TOWEL GREEN STERILE FF (TOWEL DISPOSABLE) ×3 IMPLANT
TRAY FAXITRON CT DISP (TRAY / TRAY PROCEDURE) ×3 IMPLANT
TUBE CONNECTING 20X1/4 (TUBING) IMPLANT
YANKAUER SUCT BULB TIP NO VENT (SUCTIONS) IMPLANT

## 2021-07-09 NOTE — Interval H&P Note (Signed)
History and Physical Interval Note:  07/09/2021 8:34 AM  Jasmine Swanson [name]  has presented today for surgery, with the diagnosis of RIGHT BREAST INVASIVE DUCTAL CARCINOMA.  The various methods of treatment have been discussed with the patient and family. After consideration of risks, benefits and other options for treatment, the patient has consented to  Procedure(s): RIGHT BREAST LUMPECTOMY WITH RADIOACTIVE SEED LOCALIZATION (Right) as a surgical intervention.  The patient's history has been reviewed, patient examined, no change in status, stable for surgery.  I have reviewed the patient's chart and labs.  Questions were answered to the patient's satisfaction.     [name]

## 2021-07-09 NOTE — Transfer of Care (Signed)
Immediate Anesthesia Transfer of Care Note  Patient: Jasmine Swanson  Procedure(s) Performed: RIGHT BREAST LUMPECTOMY WITH RADIOACTIVE SEED LOCALIZATION (Right: Breast)  Patient Location: PACU  Anesthesia Type:General  Level of Consciousness: sedated  Airway & Oxygen Therapy: Patient Spontanous Breathing and Patient connected to face mask oxygen  Post-op Assessment: Report given to RN and Post -op Vital signs reviewed and stable  Post vital signs: Reviewed and stable  Last Vitals:  Vitals Value Taken Time  BP    Temp    Pulse 64 07/09/21 0947  Resp 10 07/09/21 0947  SpO2 99 % 07/09/21 0947    Last Pain:  Vitals:   07/09/21 0807  TempSrc: Oral  PainSc: 0-No pain         Complications: No notable events documented.

## 2021-07-09 NOTE — Anesthesia Procedure Notes (Signed)
Procedure Name: LMA Insertion Date/Time: 07/09/2021 9:09 AM Performed by: Maryella Shivers, CRNA Pre-anesthesia Checklist: Patient identified, Emergency Drugs available, Suction available and Patient being monitored Patient Re-evaluated:Patient Re-evaluated prior to induction Oxygen Delivery Method: Circle system utilized Preoxygenation: Pre-oxygenation with 100% oxygen Induction Type: IV induction Ventilation: Mask ventilation without difficulty LMA: LMA inserted LMA Size: 4.0 Number of attempts: 1 Airway Equipment and Method: Bite block Placement Confirmation: positive ETCO2 Tube secured with: Tape Dental Injury: Teeth and Oropharynx as per pre-operative assessment

## 2021-07-09 NOTE — Anesthesia Preprocedure Evaluation (Addendum)
Anesthesia Evaluation  Patient identified by MRN, date of birth, ID band Patient awake    Reviewed: Allergy & Precautions, NPO status , Patient's Chart, lab work & pertinent test results, reviewed documented beta blocker date and time   History of Anesthesia Complications Negative for: history of anesthetic complications  Airway Mallampati: I  TM Distance: <3 FB Neck ROM: Full    Dental  (+) Teeth Intact, Dental Advisory Given   Pulmonary neg shortness of breath, neg sleep apnea, neg COPD, neg recent URI, former smoker,    breath sounds clear to auscultation       Cardiovascular hypertension, Pt. on medications and Pt. on home beta blockers + dysrhythmias  Rhythm:Regular     Neuro/Psych PSYCHIATRIC DISORDERS Anxiety Depression negative neurological ROS     GI/Hepatic GERD  Controlled and Medicated,  Endo/Other  negative endocrine ROS  Renal/GU      Musculoskeletal  (+) Arthritis ,   Abdominal   Peds  Hematology negative hematology ROS (+)   Anesthesia Other Findings   Reproductive/Obstetrics                            Anesthesia Physical  Anesthesia Plan  ASA: 2  Anesthesia Plan: General   Post-op Pain Management: Minimal or no pain anticipated   Induction: Intravenous  PONV Risk Score and Plan: 3 and Ondansetron, Dexamethasone and Treatment may vary due to age or medical condition  Airway Management Planned: Oral ETT and LMA  Additional Equipment: None  Intra-op Plan:   Post-operative Plan: Extubation in OR  Informed Consent: I have reviewed the patients History and Physical, chart, labs and discussed the procedure including the risks, benefits and alternatives for the proposed anesthesia with the patient or authorized representative who has indicated his/her understanding and acceptance.     Dental advisory given  Plan Discussed with: CRNA and  Anesthesiologist  Anesthesia Plan Comments:         Anesthesia Quick Evaluation

## 2021-07-09 NOTE — Progress Notes (Signed)
[facility] Psychosocial Distress Screening Spiritual Care  Left voicemail for Jasmine Swanson following Breast Multidisciplinary Clinic to introduce [place] team/resources, reviewing distress screen per protocol.  The patient scored a 10 on the Psychosocial Distress Thermometer which indicates severe distress.  ONCBCN DISTRESS SCREENING 07/09/2021  Screening Type Initial Screening  Distress experienced in past week (1-10) 10  Emotional problem type Nervousness/Anxiety;Adjusting to illness  Information Concerns Type Lack of info about diagnosis;Lack of info about treatment;Lack of info about complementary therapy choices  Physical Problem type Sleep/insomnia  Referral to support programs Yes    Follow up needed: Yes.  Plan to phone again next week, given her schedule.   [place], [place], [facility] Pager [phone] Voicemail [phone]

## 2021-07-09 NOTE — Anesthesia Postprocedure Evaluation (Signed)
Anesthesia Post Note  Patient: Rebeca Alert Peitz  Procedure(s) Performed: RIGHT BREAST LUMPECTOMY WITH RADIOACTIVE SEED LOCALIZATION (Right: Breast)     Patient location during evaluation: PACU Anesthesia Type: General Level of consciousness: awake and alert Pain management: pain level controlled Vital Signs Assessment: post-procedure vital signs reviewed and stable Respiratory status: spontaneous breathing, nonlabored ventilation, respiratory function stable and patient connected to nasal cannula oxygen Cardiovascular status: blood pressure returned to baseline and stable Postop Assessment: no apparent nausea or vomiting Anesthetic complications: no   No notable events documented.  Last Vitals:  Vitals:   07/09/21 1052 07/09/21 1100  BP:  (!) 163/58  Pulse:  76  Resp:  16  Temp:  36.5 C  SpO2: 95% 94%    Last Pain:  Vitals:   07/09/21 1100  TempSrc:   PainSc: 0-No pain                 Roanna Reaves

## 2021-07-09 NOTE — Op Note (Signed)
Pre-op Diagnosis:  Right breast invasive ductal carcinoma °Post-op Diagnosis: same °Procedure:  Right radioactive seed localized lumpectomy °Surgeon:  TSUEI,MATTHEW K. °Anesthesia:  GEN - LMA °Indications:  This is an 80-year-old female who presents after routine screening mammogram revealed a possible right breast mass.  She underwent further work-up which revealed a mass in the right breast at 3:00 located 6 cm from the nipple.  This measured 3 x 4 x 4 mm.  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  The axilla appeared negative. °  °Description of procedure: The patient is brought to the operating room placed in supine position on the operating room table. After an adequate level of general anesthesia was obtained, her right breast was prepped with ChloraPrep and draped in sterile fashion. A timeout was taken to ensure the proper patient and proper procedure. She has ecchymosis and some excoriation of the skin from the recent core biopsy.  We interrogated the breast with the neoprobe. We made a transverse incision in the medial portion of the right breast after infiltrating with 0.25% Marcaine. Dissection was carried down in the breast tissue with cautery. We used the neoprobe to guide us towards the radioactive seed. We excised an area of tissue around the radioactive seed 2 cm in diameter. The specimen was removed and was oriented with a paint kit. Specimen mammogram showed the radioactive seed as well as the biopsy clip within the specimen. This was sent for pathologic examination. There is no residual radioactivity within the biopsy cavity. We inspected carefully for hemostasis. The wound was thoroughly irrigated. The wound was closed with a deep layer of 3-0 Vicryl and a subcuticular layer of 4-0 Monocryl. Benzoin Steri-Strips were applied. The patient was then extubated and brought to the recovery room in stable condition. All sponge, instrument, and needle  counts are correct. ° °Matthew K. Tsuei, MD, FACS °[facility]  °General/ Trauma Surgery ° °07/09/2021 °9:44 AM ° ° °

## 2021-07-09 NOTE — Discharge Instructions (Addendum)
[place] Office Phone Number [phone]  BREAST BIOPSY/ PARTIAL MASTECTOMY: POST OP INSTRUCTIONS  Always review your discharge instruction sheet given to you by the facility where your surgery was performed.  IF YOU HAVE DISABILITY OR FAMILY LEAVE FORMS, YOU MUST BRING THEM TO THE OFFICE FOR PROCESSING.  DO NOT GIVE THEM TO YOUR DOCTOR.  A prescription for pain medication may be given to you upon discharge.  Take your pain medication as prescribed, if needed.  If narcotic pain medicine is not needed, then you may take acetaminophen (Tylenol) or ibuprofen (Advil) as needed. Take your usually prescribed medications unless otherwise directed If you need a refill on your pain medication, please contact your pharmacy.  They will contact our office to request authorization.  Prescriptions will not be filled after 5pm or on week-ends. You should eat very light the first 24 hours after surgery, such as soup, crackers, pudding, etc.  Resume your normal diet the day after surgery. Most patients will experience some swelling and bruising in the breast.  Ice packs and a good support bra will help.  Swelling and bruising can take several days to resolve.  It is common to experience some constipation if taking pain medication after surgery.  Increasing fluid intake and taking a stool softener will usually help or prevent this problem from occurring.  A mild laxative (Milk of Magnesia or Miralax) should be taken according to package directions if there are no bowel movements after 48 hours. Unless discharge instructions indicate otherwise, you may remove your bandages 24-48 hours after surgery, and you may shower at that time.  You may have steri-strips (small skin tapes) in place directly over the incision.  These strips should be left on the skin for 7-10 days.  If your surgeon used skin glue on the incision, you may shower in 24 hours.  The glue will flake off over the next 2-3 weeks.  Any  sutures or staples will be removed at the office during your follow-up visit. ACTIVITIES:  You may resume regular daily activities (gradually increasing) beginning the next day.  Wearing a good support bra or sports bra minimizes pain and swelling.  You may have sexual intercourse when it is comfortable. You may drive when you no longer are taking prescription pain medication, you can comfortably wear a seatbelt, and you can safely maneuver your car and apply brakes. RETURN TO WORK:  ______________________________________________________________________________________ Jasmine Swanson should see your doctor in the office for a follow-up appointment approximately two weeks after your surgery.  Your doctors nurse will typically make your follow-up appointment when she calls you with your pathology report.  Expect your pathology report 2-3 business days after your surgery.  You may call to check if you do not hear from [place] after three days. OTHER INSTRUCTIONS: _______________________________________________________________________________________________ _____________________________________________________________________________________________________________________________________ _____________________________________________________________________________________________________________________________________ _____________________________________________________________________________________________________________________________________  WHEN TO CALL YOUR DOCTOR: Fever over 101.0 Nausea and/or vomiting. Extreme swelling or bruising. Continued bleeding from incision. Increased pain, redness, or drainage from the incision.  The clinic staff is available to answer your questions during regular business hours.  Please dont hesitate to call and ask to speak to one of the nurses for clinical concerns.  If you have a medical emergency, go to the nearest emergency room or call 911.  A surgeon from [facility] is always on call at the hospital.  For further questions, please visit [email or web address]      Post Anesthesia Home Care Instructions  Activity: Get plenty of rest for the  remainder of the day. A responsible individual must stay with you for 24 hours following the procedure.  For the next 24 hours, DO NOT: -Drive a car -Paediatric nurse -Drink alcoholic beverages -Take any medication unless instructed by your physician -Make any legal decisions or sign important papers.  Meals: Start with liquid foods such as gelatin or soup. Progress to regular foods as tolerated. Avoid greasy, spicy, heavy foods. If nausea and/or vomiting occur, drink only clear liquids until the nausea and/or vomiting subsides. Call your physician if vomiting continues.  Special Instructions/Symptoms: Your throat may feel dry or sore from the anesthesia or the breathing tube placed in your throat during surgery. If this causes discomfort, gargle with warm salt water. The discomfort should disappear within 24 hours.  If you had a scopolamine patch placed behind your ear for the management of post- operative nausea and/or vomiting:  1. The medication in the patch is effective for 72 hours, after which it should be removed.  Wrap patch in a tissue and discard in the trash. Wash hands thoroughly with soap and water. 2. You may remove the patch earlier than 72 hours if you experience unpleasant side effects which may include dry mouth, dizziness or visual disturbances. 3. Avoid touching the patch. Wash your hands with soap and water after contact with the patch.        Next dose of Tylenol can be given after 2:09 PM.

## 2021-07-10 ENCOUNTER — Encounter (HOSPITAL_BASED_OUTPATIENT_CLINIC_OR_DEPARTMENT_OTHER): Payer: Self-pay | Admitting: Surgery

## 2021-07-10 LAB — SURGICAL PATHOLOGY

## 2021-07-10 NOTE — Progress Notes (Signed)
Left message stating courtesy call and if any questions or concerns please call the doctors office.  

## 2021-07-11 ENCOUNTER — Encounter: Payer: Self-pay | Admitting: *Deleted

## 2021-07-11 ENCOUNTER — Telehealth: Payer: Self-pay | Admitting: *Deleted

## 2021-07-11 NOTE — Telephone Encounter (Signed)
Left message for a return phone call to follow up from [facility] 2/8.

## 2021-07-15 ENCOUNTER — Encounter: Payer: Self-pay | Admitting: General Practice

## 2021-07-15 NOTE — Progress Notes (Signed)
[facility] Spiritual Care Note  Left follow-up voicemail encouraging return call.   [place], [place], [facility] Pager [phone] Voicemail [phone]

## 2021-07-16 ENCOUNTER — Encounter: Payer: Self-pay | Admitting: *Deleted

## 2021-08-02 ENCOUNTER — Telehealth: Payer: Self-pay | Admitting: Genetic Counselor

## 2021-08-02 ENCOUNTER — Encounter: Payer: Self-pay | Admitting: Genetic Counselor

## 2021-08-02 DIAGNOSIS — Z1379 Encounter for other screening for genetic and chromosomal anomalies: Secondary | ICD-10-CM

## 2021-08-02 HISTORY — DX: Encounter for other screening for genetic and chromosomal anomalies: Z13.79

## 2021-08-02 NOTE — Telephone Encounter (Signed)
I attempted to contact Ms. Sanko to discuss her genetic testing results (77 genes). I left a voicemail requesting she call me back at [phone]. ? ?Lucille Passy, MS, LCGC ?Genetic Counselor ?[email or web address] ?(P) [phone] ? ?

## 2021-08-02 NOTE — Telephone Encounter (Signed)
I contacted Ms. Edsall to discuss her genetic testing results. No pathogenic variants were identified in the 77 genes analyzed. 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 Detailed clinic note to follow. ? ?The test report has been scanned into EPIC and is located under the Molecular Pathology section of the Results Review tab.  A portion of the result report is included below for reference.  ? ?Lucille Passy, MS, [place] ?Genetic Counselor ?[email or web address] ?(P) [phone] ? ? ?

## 2021-08-05 ENCOUNTER — Ambulatory Visit: Payer: Self-pay | Admitting: Genetic Counselor

## 2021-08-05 DIAGNOSIS — Z170 Estrogen receptor positive status [ER+]: Secondary | ICD-10-CM

## 2021-08-05 DIAGNOSIS — C50811 Malignant neoplasm of overlapping sites of right female breast: Secondary | ICD-10-CM

## 2021-08-05 DIAGNOSIS — Z1379 Encounter for other screening for genetic and chromosomal anomalies: Secondary | ICD-10-CM

## 2021-08-05 NOTE — Progress Notes (Signed)
HPI:   Jasmine Swanson was previously seen in the [facility] due to a personal and family history of cancer and concerns regarding a hereditary predisposition to cancer. Please refer to our prior cancer genetics clinic note for more information regarding our discussion, assessment and recommendations, at the time. Jasmine Swanson recent genetic test results were disclosed to her, as were recommendations warranted by these results. These results and recommendations are discussed in more detail below.  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 On screening mammography, it appears mildly spiculated. It is not definitely spiculated based on today's spot imaging. It is seen only on the MLO view near the level the nipple. It is medially located based on 3D slices.   On physical exam, no suspicious lumps are identified.   Targeted ultrasound is performed, showing a solid mass in the right breast at 3 o'clock, 6 cm the nipple likely correlating with the mammographically identified mass. There is internal blood flow. This irregular mass measures 3 by 4 x 4 mm. No axillary adenopathy.   IMPRESSION: New solid irregular mass in the right breast at 3 o'clock, 6 cm from the nipple on ultrasound. No axillary adenopathy.   06/25/2021 Cancer Staging   Staging form: Breast, AJCC 8th Edition - Clinical stage from 06/25/2021: Stage IA (cT1a, cN0, cM0, G1, ER+, PR+, HER2-) -  Signed by Jasmine Merle, MD on 07/02/2021 Stage prefix: Initial diagnosis Histologic grading system: 3 grade system    06/25/2021 Initial Biopsy   Diagnosis Breast, right, needle core biopsy, 3 o'clock, 6cmfn - INVASIVE DUCTAL CARCINOMA WITH CALCIFICATIONS - SEE COMMENT Microscopic Comment Based on the biopsy, the carcinoma appears Nottingham grade 1 of 3 and measures 0.4 cm in greatest linear extent.  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 Report date is 07/26/2021.  The CancerNext-Expanded gene panel offered by [facility] and includes sequencing, rearrangement, and RNA analysis for the following 77 genes: AIP, ALK, APC, ATM, AXIN2, BAP1, BARD1, BLM, BMPR1A, BRCA1, BRCA2, BRIP1, CDC73, CDH1, CDK4, CDKN1B, CDKN2A, CHEK2, CTNNA1, DICER1, FANCC, FH, FLCN, GALNT12, KIF1B, LZTR1, MAX, MEN1, MET, MLH1, MSH2, MSH3, MSH6, MUTYH, NBN, NF1, NF2, NTHL1, PALB2, PHOX2B, PMS2, POT1, PRKAR1A, PTCH1, PTEN, RAD51C, RAD51D, RB1, RECQL, RET, SDHA, SDHAF2, SDHB, SDHC, SDHD, SMAD4, SMARCA4, SMARCB1, SMARCE1, STK11, SUFU, TMEM127, TP53, TSC1, TSC2, VHL and XRCC2 (sequencing and deletion/duplication); EGFR, EGLN1, HOXB13, KIT, MITF, PDGFRA, POLD1, and POLE (sequencing only); EPCAM and GREM1 (deletion/duplication only).      FAMILY HISTORY:  We obtained a detailed, 4-generation family history.  Significant diagnoses are listed below:  Family History  °Problem Relation Age of Onset  ° Hyperlipidemia Mother    ° Hypertension Mother    °  Heart attack Mother    ° Colon cancer Father    °      dx. 80s  ° Prostate cancer Father    °      dx. 90s  ° Breast cancer Cousin    °      dx. 60s, paternal  ° Sudden death Neg Hx    ° Diabetes Neg Hx    °  °  ° °Jasmine Swanson's father was diagnosed with colon cancer in his 80s and prostate cancer in his 90s, he died at age 96. Her paternal cousin was diagnosed with breast cancer in her 60s, she is deceased. Jasmine Swanson is unaware of previous family history of genetic testing for hereditary cancer risks. There is no reported Ashkenazi Jewish ancestry.  ° °GENETIC TEST RESULTS:  °The [facility] CancerNext-Expanded Panel found no pathogenic mutations.  ° °The CancerNext-Expanded gene panel offered by [facility] and includes sequencing, rearrangement, and RNA analysis for the following 77 genes: AIP, ALK, APC, ATM, AXIN2, BAP1, BARD1, BLM, BMPR1A, BRCA1, BRCA2, BRIP1, CDC73, CDH1, CDK4, CDKN1B, CDKN2A, CHEK2, CTNNA1, DICER1, FANCC, FH, FLCN, GALNT12, KIF1B, LZTR1, MAX, MEN1, MET, MLH1, MSH2, MSH3, MSH6, MUTYH, NBN, NF1, NF2, NTHL1, PALB2, PHOX2B, PMS2, POT1, PRKAR1A, PTCH1, PTEN, RAD51C, RAD51D, RB1, RECQL, RET, SDHA, SDHAF2, SDHB, SDHC, SDHD, SMAD4, SMARCA4, SMARCB1, SMARCE1, STK11, SUFU, TMEM127, TP53, TSC1, TSC2, VHL and XRCC2 (sequencing and deletion/duplication); EGFR, EGLN1, HOXB13, KIT, MITF, PDGFRA, POLD1, and POLE (sequencing only); EPCAM and GREM1 (deletion/duplication only).  ° °The test report has been scanned into EPIC and is located under the Molecular Pathology section of the Results Review tab.  A portion of the result report is included below for reference. Genetic testing reported out on 07/26/2021.  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  At this time, it is unknown if this variant is associated with an increased risk for cancer or if it is benign, but most uncertain variants are reclassified to benign. It should not be used to  make medical management decisions. With time, we suspect the laboratory will determine the significance of this variant, if any. If the laboratory reclassifies this variant, we will attempt to contact Ms. Woo to discuss it further.  ° °Even though a pathogenic variant was not identified, possible explanations for her personal history of cancer may include: °There may be no hereditary risk for cancer in the family. The cancers in Ms. Delisa and/or her family may be due to other genetic or environmental factors. °There may be a gene mutation in one of these genes that current testing methods cannot detect, but that chance is small. °There could be another gene that has not yet been discovered, or that we have not yet tested, that is responsible for the cancer diagnoses in the family.  ° °ADDITIONAL GENETIC TESTING:  °We discussed with Ms. Sparrow that her genetic testing was fairly extensive.  If there are genes identified to increase cancer risk that can be analyzed in the future, we would be happy to discuss and coordinate this testing at that time.   ° °CANCER SCREENING RECOMMENDATIONS:  °Ms. Senna's test result is considered negative (normal).  This means that we have not identified a hereditary cause for her personal and family history of cancer at this time.  ° °  An individual's cancer risk and medical management are not determined by genetic test results alone. Overall cancer risk assessment incorporates additional factors, including personal medical history, family history, and any available genetic information that may result in a personalized plan for cancer prevention and surveillance. Therefore, it is recommended she continue to follow the cancer management and screening guidelines provided by her oncology and primary healthcare provider.  RECOMMENDATIONS FOR FAMILY MEMBERS:   Since she did not inherit a mutation in a cancer predisposition gene included on this panel, her daughter could not  have inherited a mutation from her in one of these genes. We do not recommend familial testing for the TSC2 variant of uncertain significance (VUS).  FOLLOW-UP:  Cancer genetics is a rapidly advancing field and it is possible that new genetic tests will be appropriate for her and/or her family members in the future. We encouraged her to remain in contact with cancer genetics on an annual basis so we can update her personal and family histories and let her know of advances in cancer genetics that may benefit this family.   Our contact number was provided. Ms. Newman questions were answered to her satisfaction, and she knows she is welcome to call us at anytime with additional questions or concerns.   Lucille Passy, MS, [facility] Genetic Counselor [email or web address] (P) [phone]

## 2021-08-07 ENCOUNTER — Ambulatory Visit: Payer: Medicare Other | Admitting: Radiation Oncology

## 2021-08-07 ENCOUNTER — Ambulatory Visit: Payer: Medicare Other

## 2021-08-08 ENCOUNTER — Encounter (HOSPITAL_COMMUNITY): Payer: Self-pay

## 2021-08-13 ENCOUNTER — Encounter: Payer: Self-pay | Admitting: *Deleted

## 2021-08-14 ENCOUNTER — Telehealth: Payer: Self-pay | Admitting: Hematology

## 2021-08-14 NOTE — Telephone Encounter (Signed)
.  Called patient to schedule appointment per 3/21 inbasket, patient is aware of date and time.   ?

## 2021-08-21 ENCOUNTER — Inpatient Hospital Stay: Payer: Medicare Other | Attending: Hematology | Admitting: Hematology

## 2021-08-21 DIAGNOSIS — C50811 Malignant neoplasm of overlapping sites of right female breast: Secondary | ICD-10-CM

## 2021-08-23 ENCOUNTER — Telehealth: Payer: Self-pay | Admitting: Hematology

## 2021-08-23 ENCOUNTER — Encounter: Payer: Self-pay | Admitting: *Deleted

## 2021-08-23 NOTE — Telephone Encounter (Signed)
.  Called patient to schedule appointment per 3/30 inbasket, patient informed me she was not interested in this appt, nurse navigator notified. ?

## 2021-08-27 ENCOUNTER — Telehealth: Payer: Self-pay | Admitting: Hematology

## 2021-08-27 NOTE — Telephone Encounter (Signed)
.  Called pt per 4/3 staff message , Patient was unavailable, a message with appt time and date was left with number on file.   ?

## 2021-08-30 ENCOUNTER — Encounter: Payer: Self-pay | Admitting: *Deleted

## 2021-09-09 ENCOUNTER — Encounter: Payer: Self-pay | Admitting: Genetic Counselor

## 2021-11-14 ENCOUNTER — Ambulatory Visit: Payer: Self-pay | Admitting: *Deleted

## 2021-11-14 NOTE — Telephone Encounter (Signed)
    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 Pt stated she discussed it with PCP and was advised it's her posture.  Pt stated she doesn't believe this is right; she takes Tylenol, and it calms it down. Pt wants to know where she should go for this.    Pt stated not painful but uncomfortable.   Pt seeking clinical advice.        Chief Complaint: tingling in scalp, chills in neck Symptoms: see above. No pain reported. Tingling in scalp better after taking tylenol and applying heat but never goes away. Can function without pain. Requesting referral for specialist.  Frequency: "for a while' Pertinent Negatives: Patient denies chest pain difficulty breathing. No headaches, dizziness, no N/T in arms or hands or fingers. No weakness or deficits on either side of body.   Disposition: '[]'$ ED /'[]'$ Urgent Care (no appt availability in office) / '[]'$ Appointment(In office/virtual)/ '[]'$  [facility]/ '[]'$ Home Care/ '[]'$ Refused Recommended Disposition /'[]'$ [facility]/ '[x]'$  Follow-up with PCP Additional Notes:   Recommended patient f/u with her PCP and request referral to neurology if PCP agrees. Go to ED if symptoms worsen.    Reason for Disposition  [1] Tingling (e.g., pins and needles) of the face, arm / hand, or leg / foot on one side of the body AND [2] present now (Exceptions: chronic/recurrent symptom lasting > 4 weeks or tingling from known cause, such as: bumped elbow, carpal tunnel syndrome, pinched nerve, frostbite)  Answer Assessment - Initial Assessment Questions 1. SYMPTOM: "What is the main symptom you are concerned about?" (e.g., weakness, numbness)     Tingling in scalp and chills in her neck.  2. ONSET: "When did this start?" (minutes, hours, days; while sleeping)     On going "for a while" 3. LAST NORMAL: "When was the last time you (the patient) were normal (no symptoms)?"     Na  4.  PATTERN "Does this come and go, or has it been constant since it started?"  "Is it present now?"     Constant  and better with tylenol and heat but never goes away  5. CARDIAC SYMPTOMS: "Have you had any of the following symptoms: chest pain, difficulty breathing, palpitations?"     no 6. NEUROLOGIC SYMPTOMS: "Have you had any of the following symptoms: headache, dizziness, vision loss, double vision, changes in speech, unsteady on your feet?"     No. Only reports sensation on tingling in scalp and chills in neck.  7. OTHER SYMPTOMS: "Do you have any other symptoms?"     No  8. PREGNANCY: "Is there any chance you are pregnant?" "When was your last menstrual period?"     na  Protocols used: Neurologic Deficit-A-AH

## 2021-11-27 ENCOUNTER — Encounter: Payer: Self-pay | Admitting: *Deleted

## 2021-11-27 ENCOUNTER — Inpatient Hospital Stay: Payer: Medicare Other | Attending: Nurse Practitioner | Admitting: Nurse Practitioner

## 2021-11-27 ENCOUNTER — Encounter: Payer: Self-pay | Admitting: Nurse Practitioner

## 2021-11-27 ENCOUNTER — Other Ambulatory Visit: Payer: Self-pay

## 2021-11-27 VITALS — BP 157/51 | HR 64 | Temp 97.3°F | Resp 18 | Wt 132.5 lb

## 2021-11-27 DIAGNOSIS — C50811 Malignant neoplasm of overlapping sites of right female breast: Secondary | ICD-10-CM | POA: Diagnosis not present

## 2021-11-27 DIAGNOSIS — Z170 Estrogen receptor positive status [ER+]: Secondary | ICD-10-CM | POA: Diagnosis not present

## 2021-11-27 DIAGNOSIS — I10 Essential (primary) hypertension: Secondary | ICD-10-CM | POA: Insufficient documentation

## 2021-11-27 DIAGNOSIS — Z90710 Acquired absence of both cervix and uterus: Secondary | ICD-10-CM | POA: Insufficient documentation

## 2021-11-27 NOTE — Progress Notes (Signed)
CLINIC:  Survivorship   Patient Care Team: Gweneth Dimitri, MD as PCP - General (Family Medicine) Gweneth Dimitri, MD as Consulting Physician (Family Medicine) Manus Rudd, MD as Consulting Physician (General Surgery) Malachy Mood, MD as Consulting Physician (Hematology) Dorothy Puffer, MD as Consulting Physician (Radiation Oncology) Donnelly Angelica, RN as Oncology Nurse Navigator Pershing Proud, RN as Oncology Nurse Navigator Pollyann Samples, NP as Nurse Practitioner (Nurse Practitioner)   REASON FOR VISIT:  Routine follow-up post-treatment for a recent history of breast cancer.  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 On screening mammography, it appears mildly spiculated. It is not definitely spiculated based on today's spot imaging. It is seen only on the MLO view near the level the nipple. It is medially located based on 3D slices.   On physical exam, no suspicious lumps are identified.   Targeted ultrasound is performed, showing a solid mass in the right breast at 3 o'clock, 6 cm the nipple likely correlating with the mammographically identified mass. There is internal blood flow. This irregular mass measures 3 by 4 x 4 mm. No axillary adenopathy.   IMPRESSION: New solid irregular mass in the right breast at 3 o'clock, 6 cm from the nipple on ultrasound. No axillary adenopathy.   06/25/2021 Cancer Staging   Staging form: Breast, AJCC 8th Edition -  Clinical stage from 06/25/2021: Stage IA (cT1a, cN0, cM0, G1, ER+, PR+, HER2-) - Signed by Malachy Mood, MD on 07/02/2021 Stage prefix: Initial diagnosis Histologic grading system: 3 grade system   06/25/2021 Initial Biopsy   Diagnosis Breast, right, needle core biopsy, 3 o'clock, 6cmfn - INVASIVE DUCTAL CARCINOMA WITH CALCIFICATIONS - SEE COMMENT Microscopic Comment Based on the biopsy, the carcinoma appears Nottingham grade 1 of 3 and measures 0.4 cm in greatest linear extent.  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 Report date is 07/26/2021.  The CancerNext-Expanded gene panel offered by [facility] and includes sequencing, rearrangement, and RNA analysis for the following 77 genes: AIP, ALK, APC, ATM, AXIN2, BAP1, BARD1, BLM, BMPR1A, BRCA1, BRCA2, BRIP1, CDC73, CDH1, CDK4, CDKN1B, CDKN2A, CHEK2, CTNNA1, DICER1, FANCC, FH, FLCN, GALNT12, KIF1B, LZTR1, MAX, MEN1, MET, MLH1, MSH2, MSH3, MSH6, MUTYH, NBN, NF1, NF2, NTHL1, PALB2, PHOX2B, PMS2, POT1, PRKAR1A, PTCH1, PTEN, RAD51C, RAD51D, RB1, RECQL, RET, SDHA, SDHAF2, SDHB, SDHC, SDHD, SMAD4, SMARCA4, SMARCB1, SMARCE1, STK11, SUFU, TMEM127, TP53, TSC1, TSC2, VHL and XRCC2 (sequencing and deletion/duplication); EGFR, EGLN1, HOXB13, KIT, MITF, PDGFRA, POLD1, and POLE (sequencing only); EPCAM and GREM1 (deletion/duplication only).    07/09/2021 Cancer Staging   Staging form: Breast, AJCC 8th Edition - Pathologic  stage from 07/09/2021: Stage Unknown (pT1c, pNX, cM0, G1, ER+, PR+,  HER2-) - Signed by Truitt Merle, MD on 08/20/2021 Stage prefix: Initial diagnosis Histologic grading system: 3 grade system Residual tumor (R): R0 - None   11/27/2021 Survivorship   SCP delivered by Cira Rue, NP     INTERVAL HISTORY:  Jasmine Swanson presents to the [facility] today for our initial meeting to review her survivorship care plan detailing her treatment course for breast cancer, as well as monitoring long-term side effects of that treatment, education regarding health maintenance, screening, and overall wellness and health promotion.     Overall, Jasmine Swanson reports feeling well in general.  She has recovered from surgery with mild residual nipple itching and slight discomfort with seatbelt.  Denies new lump/mass, nipple discharge or inversion, or skin change.  She decided to forego adjuvant radiation and antiestrogen after discussion with her medical team and not wanting to deal with side effects.  She is slightly depressed due to limited social outlet and not able to be as active as she wants was.  She has a daughter and 2 grandchildren.   ONCOLOGY TREATMENT TEAM:  1. Surgeon:  Dr. Georgette Dover at [facility] 2. Medical Oncologist: Dr. Burr Medico 3. Radiation Oncologist: Dr. Lisbeth Renshaw, did not proceed with radiation    PAST MEDICAL/SURGICAL HISTORY:  Past Medical History:  Diagnosis Date   Anxiety    Arthritis    Bundle branch block, left    Complication of anesthesia    laryngeal spams 50 yrs ago   Depression    Dysphagia    Essential hypertension 06/17/2013   GERD (gastroesophageal reflux disease)    Hyperlipidemia    Kyphosis    PAC (premature atrial contraction) 06/17/2013   PAT (paroxysmal atrial tachycardia) ([place]) 06/17/2013   Plantar fasciitis of right foot    Preop cardiovascular exam 10/16/2017   Primary localized osteoarthrosis of right shoulder 11/10/2017   Right shoulder pain 01/20/2012   Past Surgical History:  Procedure Laterality Date    ABDOMINAL HYSTERECTOMY     bladder tac     BREAST LUMPECTOMY WITH RADIOACTIVE SEED LOCALIZATION Right 07/09/2021   Procedure: RIGHT BREAST LUMPECTOMY WITH RADIOACTIVE SEED LOCALIZATION;  Surgeon: Donnie Mesa, MD;  Location: [place];  Service: General;  Laterality: Right;   CARDIAC CATHETERIZATION     TONSILLECTOMY     TOTAL SHOULDER ARTHROPLASTY Right 11/10/2017   Procedure: TOTAL SHOULDER ARTHROPLASTY;  Surgeon: Marchia Bond, MD;  Location: [place];  Service: Orthopedics;  Laterality: Right;     ALLERGIES:  Allergies  Allergen Reactions   Benicar Hct [Olmesartan Medoxomil-Hctz]     Edema of lower extremities    Codeine Other (See Comments)    Drops blood pressure   Diovan [Valsartan] Other (See Comments)    Palpitations.     Niacin And Related Other (See Comments)    Turns patient red, makes her pass out     CURRENT MEDICATIONS:  Outpatient Encounter Medications as of 11/27/2021  Medication Sig Note   acetaminophen (TYLENOL) 650 MG CR tablet Take 650 mg by mouth every 8 (eight) hours as needed for pain.    Ascorbic Acid (VITAMIN C) 1000 MG tablet Take 1,000 mg by mouth daily.    aspirin EC 81 MG tablet Take 81 mg by mouth every Monday, Wednesday, and Friday. 10/26/2017: On hold for procedure    Calcium Carb-Cholecalciferol (CALCIUM-VITAMIN D) 600-400 MG-UNIT TABS Take 1 tablet by mouth daily.    Cholecalciferol 4000  units CAPS Take 4,000 Units by mouth daily.    escitalopram (LEXAPRO) 10 MG tablet Take 10 mg by mouth every evening.     fish oil-omega-3 fatty acids 1000 MG capsule Take 1 g by mouth daily.     Flaxseed, Linseed, (FLAXSEED OIL) 1000 MG CAPS Take 1,000 mg by mouth daily.    gabapentin (NEURONTIN) 100 MG capsule Take 1 capsule (100 mg total) by mouth 4 (four) times daily. (Patient taking differently: Take 100 mg by mouth at bedtime.)    LORazepam (ATIVAN) 0.5 MG tablet Take 0.5 mg by mouth every 8 (eight) hours.    Magnesium 250 MG TABS Take 250 mg by  mouth daily.     metoprolol (TOPROL-XL) 200 MG 24 hr tablet Take 200 mg by mouth every evening.     pyridOXINE (VITAMIN B-6) 100 MG tablet Take 100 mg by mouth daily.    rosuvastatin (CRESTOR) 20 MG tablet Take 20 mg by mouth every evening.     No facility-administered encounter medications on file as of 11/27/2021.     ONCOLOGIC FAMILY HISTORY:  Family History  Problem Relation Age of Onset   Hyperlipidemia Mother    Hypertension Mother    Heart attack Mother    Colon cancer Father        dx. 21s   Prostate cancer Father        dx. 83s   Breast cancer Cousin        dx. 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  She has 1 daughter and 2 grandchildren, a boy and a girl.  She denies any current or history of tobacco, alcohol, or illicit drug use.     PHYSICAL EXAMINATION:  Vital Signs:   Vitals:   11/27/21 1226  BP: (!) 157/51  Pulse: 64  Resp: 18  Temp: (!) 97.3 F (36.3 C)  SpO2: 98%   Filed Weights   11/27/21 1226  Weight: 132 lb 8 oz (60.1 kg)   General: Well-nourished, well-appearing female in no acute distress.   HEENT: Sclerae anicteric.  Lymph: No cervical, supraclavicular, or infraclavicular lymphadenopathy noted on palpation.  Respiratory:  breathing non-labored.  Neuro: No focal deficits. Steady gait.  Psych: Mood and affect normal and appropriate for situation.  Extremities: No edema. MSK: No focal spinal tenderness to palpation.  Full range of motion in bilateral upper extremities Skin: Warm and dry. Breast exam: Breasts are symmetrical with inverted nipples, no discharge.  S/p right lumpectomy, incisions completely healed with mild retraction at the lumpectomy bed.  No palpable mass or nodularity in either breast or axilla that I could appreciate  LABORATORY DATA:  None for this visit.  DIAGNOSTIC IMAGING:   None for this visit.      ASSESSMENT AND PLAN:  Ms.. Swanson is a pleasant 81 y.o. 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  She declined adjuvant radiation therapy and anti-estrogen therapy.  She presents to the Survivorship Clinic for our initial meeting and routine follow-up post-completion of treatment for breast cancer.    1. Stage I right breast cancer:  Jasmine Swanson has recovered well from definitive treatment for breast cancer.  She did not proceed with adjuvant radiation or antiestrogen therapy.  She is on surveillance  alone.  She will follow-up with [place] annually.  Today, a comprehensive survivorship care plan and treatment summary was reviewed with the patient today detailing her breast cancer diagnosis, treatment course, potential late/long-term effects of treatment, appropriate follow-up care with recommendations for the future, and patient education resources.  A copy of this summary, along with a letter will be sent to the patient's primary care provider via mail/fax/In Basket message after today's visit.    2. Bone health:  Given Jasmine Swanson's age and postmenopausal status, she should be screened for bone demineralization. We will repeat DEXA with next mammogram,  in the meantime, she was encouraged to increase her consumption of foods rich in calcium, as well as increase her weight-bearing activities.  She was given education on specific activities to promote bone health.  3. Cancer screening:  Due to Jasmine Swanson's history and her age, she should receive screening for skin cancers and engage in shared decision making for colon cancer screening.  Despite her age she remains very fit and active, I recommended to consider Cologuard for colorectal cancer screening.  She is status post hysterectomy and does not meet criteria for lung cancer screening.  The information and  recommendations are listed on the patient's comprehensive care plan/treatment summary and were reviewed in detail with the patient.    4. Health maintenance and wellness promotion: Jasmine Swanson was encouraged to consume 5-7 servings of fruits and vegetables per day.  She was also encouraged to engage in moderate exercise for 30 minutes per day most days of the week.  She was instructed to limit her alcohol consumption and continue to abstain from tobacco use.   5. Support services/counseling: It is not uncommon for this period of the patient's cancer care trajectory to be one of many emotions and stressors.  We discussed an opportunity for her to participate in the next session of [facility] ("Finding Your New Normal") support group series designed for patients after they have completed treatment.   Jasmine Swanson was encouraged to take advantage of our many other support services programs, support groups, and/or counseling in coping with her new life as a cancer survivor after completing anti-cancer treatment.  She was offered support today through active listening and expressive supportive counseling.  She was given information regarding our available services and encouraged to contact me with any questions or for help enrolling in any of our support group/programs.    Dispo:   -Diagnostic mammo and DEXA 04/2022  -Breast exam with PCP at annual wellness visit 05/2022  -Return to cancer center 11/2022  -Follow up with surgery as indicated -She is welcome to return back to the Survivorship Clinic at any time; no additional follow-up needed at this time.  -Consider referral back to survivorship as a long-term survivor for continued surveillance  Orders Placed This Encounter  Procedures   MM DIAG BREAST TOMO BILATERAL    Standing Status:   Future    Standing Expiration Date:   11/28/2022    Order Specific Question:   Reason for Exam (SYMPTOM  OR DIAGNOSIS REQUIRED)    Answer:   R breast cancer 04/2022 s/p  lumpectomy    Order Specific Question:   Preferred imaging location?    Answer:   [facility]   DG Bone Density    Standing Status:   Future    Standing Expiration Date:   11/27/2022    Order Specific Question:   Reason for Exam (SYMPTOM  OR DIAGNOSIS REQUIRED)  Answer:   screening    Order Specific Question:   Preferred imaging location?    Answer:   [facility]     A total of (30) minutes of face-to-face time was spent with this patient with greater than 50% of that time in counseling and care-coordination.   Cira Rue, NP Survivorship Program [facility] [phone]   Note: PRIMARY CARE PROVIDER Cari Caraway, [place] [phone]

## 2021-11-29 ENCOUNTER — Telehealth: Payer: Self-pay | Admitting: Nurse Practitioner

## 2021-11-29 NOTE — Telephone Encounter (Signed)
Left message with follow-up appointment per 7/5 los.

## 2022-03-26 DIAGNOSIS — Z0000 Encounter for general adult medical examination without abnormal findings: Secondary | ICD-10-CM | POA: Diagnosis not present

## 2022-03-26 DIAGNOSIS — Z6823 Body mass index (BMI) 23.0-23.9, adult: Secondary | ICD-10-CM | POA: Diagnosis not present

## 2022-03-26 DIAGNOSIS — Z1389 Encounter for screening for other disorder: Secondary | ICD-10-CM | POA: Diagnosis not present

## 2022-03-28 DIAGNOSIS — E782 Mixed hyperlipidemia: Secondary | ICD-10-CM | POA: Diagnosis not present

## 2022-03-28 DIAGNOSIS — K219 Gastro-esophageal reflux disease without esophagitis: Secondary | ICD-10-CM | POA: Diagnosis not present

## 2022-03-28 DIAGNOSIS — I10 Essential (primary) hypertension: Secondary | ICD-10-CM | POA: Diagnosis not present

## 2022-03-28 DIAGNOSIS — E538 Deficiency of other specified B group vitamins: Secondary | ICD-10-CM | POA: Diagnosis not present

## 2022-04-14 ENCOUNTER — Other Ambulatory Visit: Payer: Self-pay | Admitting: Family Medicine

## 2022-04-14 DIAGNOSIS — M8580 Other specified disorders of bone density and structure, unspecified site: Secondary | ICD-10-CM

## 2022-06-24 ENCOUNTER — Other Ambulatory Visit: Payer: Medicare Other

## 2022-07-02 ENCOUNTER — Ambulatory Visit: Payer: Self-pay

## 2022-07-02 NOTE — Telephone Encounter (Signed)
Answer Assessment - Initial Assessment Questions 1. REASON FOR CALL or QUESTION: "What is your reason for calling today?" or "How can I best help you?" or "What question do you have that I can help answer?"     Would like referral to neurologist.  Protocols used: Information Only Call - No Triage-A-AH

## 2022-07-02 NOTE — Telephone Encounter (Signed)
Patient called, left VM to return the call to the office to discuss symptoms with a nurse.  Summary: Neurology recommendation; nurse advice (Angie request)   Pt is seeking a neurology recommendation, she has headache pain often and has dealt with it most of her life. She is requesting to speak to nurse Angie if possible. She spoke to her last year in June and really appreciated her service.

## 2022-07-02 NOTE — Telephone Encounter (Signed)
  Chief Complaint: Headache, ongoing for years Symptoms: headaches Frequency: "All my life" Pertinent Negatives: Patient denies changes Disposition: '[]'$ ED /'[]'$ Urgent Care (no appt availability in office) / '[]'$ Appointment(In office/virtual)/ '[]'$  [facility]/ '[]'$ Home Care/ '[]'$ Refused Recommended Disposition /'[]'$ [facility]/ '[]'$  Follow-up with PCP Additional Notes: Pt requesting advise on how to get referral to neurologist. States PCP will not refer, states "It's muscular." Advised to contact insurance provider for that information. Pt appreciative of information and states she will do so.

## 2022-07-03 ENCOUNTER — Ambulatory Visit
Admission: RE | Admit: 2022-07-03 | Discharge: 2022-07-03 | Disposition: A | Discharge status: Home / Self Care | Payer: Medicare Other | Source: Ambulatory Visit | Attending: Nurse Practitioner | Admitting: Nurse Practitioner

## 2022-07-03 DIAGNOSIS — Z853 Personal history of malignant neoplasm of breast: Secondary | ICD-10-CM | POA: Diagnosis not present

## 2022-07-03 DIAGNOSIS — Z170 Estrogen receptor positive status [ER+]: Secondary | ICD-10-CM

## 2022-09-06 ENCOUNTER — Encounter (HOSPITAL_BASED_OUTPATIENT_CLINIC_OR_DEPARTMENT_OTHER): Payer: Self-pay | Admitting: *Deleted

## 2022-09-06 ENCOUNTER — Other Ambulatory Visit: Payer: Self-pay

## 2022-09-06 ENCOUNTER — Inpatient Hospital Stay (HOSPITAL_BASED_OUTPATIENT_CLINIC_OR_DEPARTMENT_OTHER)
Admission: EM | Admit: 2022-09-06 | Discharge: 2022-09-09 | DRG: 280 | Disposition: A | Discharge status: Home Health | Payer: Medicare Other | Attending: Family Medicine | Admitting: Family Medicine

## 2022-09-06 ENCOUNTER — Emergency Department (HOSPITAL_BASED_OUTPATIENT_CLINIC_OR_DEPARTMENT_OTHER): Payer: Medicare Other

## 2022-09-06 DIAGNOSIS — K2970 Gastritis, unspecified, without bleeding: Secondary | ICD-10-CM | POA: Diagnosis present

## 2022-09-06 DIAGNOSIS — Z885 Allergy status to narcotic agent status: Secondary | ICD-10-CM

## 2022-09-06 DIAGNOSIS — Z8042 Family history of malignant neoplasm of prostate: Secondary | ICD-10-CM | POA: Diagnosis not present

## 2022-09-06 DIAGNOSIS — Z853 Personal history of malignant neoplasm of breast: Secondary | ICD-10-CM | POA: Diagnosis not present

## 2022-09-06 DIAGNOSIS — E785 Hyperlipidemia, unspecified: Secondary | ICD-10-CM | POA: Diagnosis present

## 2022-09-06 DIAGNOSIS — Z8249 Family history of ischemic heart disease and other diseases of the circulatory system: Secondary | ICD-10-CM | POA: Diagnosis not present

## 2022-09-06 DIAGNOSIS — F32A Depression, unspecified: Secondary | ICD-10-CM | POA: Diagnosis present

## 2022-09-06 DIAGNOSIS — Z66 Do not resuscitate: Secondary | ICD-10-CM | POA: Diagnosis not present

## 2022-09-06 DIAGNOSIS — I110 Hypertensive heart disease with heart failure: Secondary | ICD-10-CM | POA: Diagnosis present

## 2022-09-06 DIAGNOSIS — Z83438 Family history of other disorder of lipoprotein metabolism and other lipidemia: Secondary | ICD-10-CM | POA: Diagnosis not present

## 2022-09-06 DIAGNOSIS — Z96611 Presence of right artificial shoulder joint: Secondary | ICD-10-CM | POA: Diagnosis present

## 2022-09-06 DIAGNOSIS — F419 Anxiety disorder, unspecified: Secondary | ICD-10-CM | POA: Diagnosis not present

## 2022-09-06 DIAGNOSIS — J90 Pleural effusion, not elsewhere classified: Secondary | ICD-10-CM | POA: Diagnosis not present

## 2022-09-06 DIAGNOSIS — E8770 Fluid overload, unspecified: Secondary | ICD-10-CM

## 2022-09-06 DIAGNOSIS — R778 Other specified abnormalities of plasma proteins: Secondary | ICD-10-CM | POA: Diagnosis not present

## 2022-09-06 DIAGNOSIS — J810 Acute pulmonary edema: Secondary | ICD-10-CM

## 2022-09-06 DIAGNOSIS — I10 Essential (primary) hypertension: Secondary | ICD-10-CM | POA: Diagnosis present

## 2022-09-06 DIAGNOSIS — I214 Non-ST elevation (NSTEMI) myocardial infarction: Secondary | ICD-10-CM | POA: Diagnosis not present

## 2022-09-06 DIAGNOSIS — Z7982 Long term (current) use of aspirin: Secondary | ICD-10-CM

## 2022-09-06 DIAGNOSIS — I5021 Acute systolic (congestive) heart failure: Secondary | ICD-10-CM | POA: Diagnosis not present

## 2022-09-06 DIAGNOSIS — E876 Hypokalemia: Secondary | ICD-10-CM | POA: Diagnosis not present

## 2022-09-06 DIAGNOSIS — J9601 Acute respiratory failure with hypoxia: Secondary | ICD-10-CM | POA: Diagnosis not present

## 2022-09-06 DIAGNOSIS — Z1152 Encounter for screening for COVID-19: Secondary | ICD-10-CM

## 2022-09-06 DIAGNOSIS — Z87891 Personal history of nicotine dependence: Secondary | ICD-10-CM | POA: Diagnosis not present

## 2022-09-06 DIAGNOSIS — Z79899 Other long term (current) drug therapy: Secondary | ICD-10-CM

## 2022-09-06 DIAGNOSIS — Z803 Family history of malignant neoplasm of breast: Secondary | ICD-10-CM

## 2022-09-06 DIAGNOSIS — F1020 Alcohol dependence, uncomplicated: Secondary | ICD-10-CM | POA: Diagnosis not present

## 2022-09-06 DIAGNOSIS — I428 Other cardiomyopathies: Secondary | ICD-10-CM

## 2022-09-06 DIAGNOSIS — Z888 Allergy status to other drugs, medicaments and biological substances status: Secondary | ICD-10-CM

## 2022-09-06 DIAGNOSIS — I420 Dilated cardiomyopathy: Secondary | ICD-10-CM | POA: Diagnosis not present

## 2022-09-06 DIAGNOSIS — E871 Hypo-osmolality and hyponatremia: Secondary | ICD-10-CM | POA: Diagnosis not present

## 2022-09-06 DIAGNOSIS — Z800 Family history of malignant neoplasm of digestive organs: Secondary | ICD-10-CM | POA: Diagnosis not present

## 2022-09-06 DIAGNOSIS — E7849 Other hyperlipidemia: Secondary | ICD-10-CM

## 2022-09-06 DIAGNOSIS — K2100 Gastro-esophageal reflux disease with esophagitis, without bleeding: Secondary | ICD-10-CM | POA: Diagnosis not present

## 2022-09-06 DIAGNOSIS — K589 Irritable bowel syndrome without diarrhea: Secondary | ICD-10-CM | POA: Diagnosis not present

## 2022-09-06 DIAGNOSIS — R7989 Other specified abnormal findings of blood chemistry: Secondary | ICD-10-CM | POA: Diagnosis not present

## 2022-09-06 HISTORY — DX: Other specified abnormal findings of blood chemistry: R79.89

## 2022-09-06 HISTORY — DX: Acute respiratory failure with hypoxia: J96.01

## 2022-09-06 HISTORY — DX: Malignant (primary) neoplasm, unspecified: C80.1

## 2022-09-06 HISTORY — DX: Do not resuscitate: Z66

## 2022-09-06 HISTORY — DX: Acute pulmonary edema: J81.0

## 2022-09-06 LAB — TROPONIN I (HIGH SENSITIVITY)
Troponin I (High Sensitivity): 31 ng/L — ABNORMAL HIGH (ref <18)
Troponin I (High Sensitivity): 248 ng/L (ref <18)
Troponin I (High Sensitivity): 476 ng/L (ref <18)
Troponin I (High Sensitivity): 558 ng/L (ref <18)

## 2022-09-06 LAB — COMPREHENSIVE METABOLIC PANEL
ALT: 21 U/L (ref 0–44)
AST: 30 U/L (ref 15–41)
Albumin: 4.1 g/dL (ref 3.5–5.0)
Alkaline Phosphatase: 45 U/L (ref 38–126)
Anion gap: 12 (ref 5–15)
BUN: 11 mg/dL (ref 8–23)
CO2: 20 mmol/L — ABNORMAL LOW (ref 22–32)
Calcium: 8.5 mg/dL — ABNORMAL LOW (ref 8.9–10.3)
Chloride: 105 mmol/L (ref 98–111)
Creatinine, Ser: 0.79 mg/dL (ref 0.44–1.00)
GFR, Estimated: >60 mL/min (ref >60)
Glucose, Bld: 117 mg/dL — ABNORMAL HIGH (ref 70–99)
Potassium: 3.6 mmol/L (ref 3.5–5.1)
Sodium: 137 mmol/L (ref 135–145)
Total Bilirubin: 1 mg/dL (ref 0.3–1.2)
Total Protein: 7.1 g/dL (ref 6.5–8.1)

## 2022-09-06 LAB — CBC WITH DIFFERENTIAL/PLATELET
Abs Immature Granulocytes: 0.02 10*3/uL (ref 0.00–0.07)
Basophils Absolute: 0 10*3/uL (ref 0.0–0.1)
Basophils Relative: 0 %
Eosinophils Absolute: 0.1 10*3/uL (ref 0.0–0.5)
Eosinophils Relative: 2 %
HCT: 37.5 % (ref 36.0–46.0)
Hemoglobin: 12.7 g/dL (ref 12.0–15.0)
Immature Granulocytes: 0 %
Lymphocytes Relative: 19 %
Lymphs Abs: 1.2 10*3/uL (ref 0.7–4.0)
MCH: 31.9 pg (ref 26.0–34.0)
MCHC: 33.9 g/dL (ref 30.0–36.0)
MCV: 94.2 fL (ref 80.0–100.0)
Monocytes Absolute: 0.5 10*3/uL (ref 0.1–1.0)
Monocytes Relative: 7 %
Neutro Abs: 4.6 10*3/uL (ref 1.7–7.7)
Neutrophils Relative %: 72 %
Platelets: 159 10*3/uL (ref 150–400)
RBC: 3.98 MIL/uL (ref 3.87–5.11)
RDW: 12.1 % (ref 11.5–15.5)
WBC: 6.4 10*3/uL (ref 4.0–10.5)
nRBC: 0 % (ref 0.0–0.2)

## 2022-09-06 LAB — LIPASE, BLOOD: Lipase: 24 U/L (ref 11–51)

## 2022-09-06 LAB — BRAIN NATRIURETIC PEPTIDE: B Natriuretic Peptide: 446.7 pg/mL — ABNORMAL HIGH (ref 0.0–100.0)

## 2022-09-06 LAB — I-STAT VENOUS BLOOD GAS, ED
Acid-base deficit: 6 mmol/L — ABNORMAL HIGH (ref 0.0–2.0)
Bicarbonate: 20.5 mmol/L (ref 20.0–28.0)
Calcium, Ion: 1.08 mmol/L — ABNORMAL LOW (ref 1.15–1.40)
HCT: 38 % (ref 36.0–46.0)
Hemoglobin: 12.9 g/dL (ref 12.0–15.0)
O2 Saturation: 77 %
Patient temperature: 99
Potassium: 3.7 mmol/L (ref 3.5–5.1)
Sodium: 138 mmol/L (ref 135–145)
TCO2: 22 mmol/L (ref 22–32)
pCO2, Ven: 41.9 mmHg — ABNORMAL LOW (ref 44–60)
pH, Ven: 7.298 (ref 7.25–7.43)
pO2, Ven: 46 mmHg — ABNORMAL HIGH (ref 32–45)

## 2022-09-06 LAB — RESP PANEL BY RT-PCR (RSV, FLU A&B, COVID)  RVPGX2
Influenza A by PCR: NEGATIVE
Influenza B by PCR: NEGATIVE
Resp Syncytial Virus by PCR: NEGATIVE
SARS Coronavirus 2 by RT PCR: NEGATIVE

## 2022-09-06 MED ORDER — LORAZEPAM 2 MG/ML IJ SOLN: 0.0000 mg | Freq: Four times a day (QID) | INTRAMUSCULAR | Status: DC

## 2022-09-06 MED ORDER — IOHEXOL 350 MG/ML SOLN
75.0000 mL | Freq: Once | INTRAVENOUS | Status: AC | PRN
  Administered 2022-09-06: 75 mL via INTRAVENOUS

## 2022-09-06 MED ORDER — ASPIRIN 325 MG PO TABS
325.0000 mg | ORAL_TABLET | Freq: Once | ORAL | Status: AC
  Administered 2022-09-06: 325 mg via ORAL
  Filled 2022-09-06: qty 1

## 2022-09-06 MED ORDER — ASPIRIN 81 MG PO TBEC
81.0000 mg | DELAYED_RELEASE_TABLET | Freq: Every day | ORAL | Status: DC
  Administered 2022-09-07 – 2022-09-09 (×3): 81 mg via ORAL
  Filled 2022-09-06 (×4): qty 1

## 2022-09-06 MED ORDER — SODIUM CHLORIDE 0.9 % IV SOLN
3.0000 g | Freq: Once | INTRAVENOUS | Status: AC
  Administered 2022-09-06: 3 g via INTRAVENOUS

## 2022-09-06 MED ORDER — LORAZEPAM 1 MG PO TABS
1.0000 mg | ORAL_TABLET | Freq: Once | ORAL | Status: AC
  Administered 2022-09-06: 1 mg via ORAL
  Filled 2022-09-06: qty 1

## 2022-09-06 MED ORDER — SODIUM CHLORIDE 0.9 % IV BOLUS
1000.0000 mL | Freq: Once | INTRAVENOUS | Status: AC
  Administered 2022-09-06: 1000 mL via INTRAVENOUS

## 2022-09-06 MED ORDER — THIAMINE MONONITRATE 100 MG PO TABS
100.0000 mg | ORAL_TABLET | Freq: Every day | ORAL | Status: DC
  Administered 2022-09-07: 100 mg via ORAL
  Filled 2022-09-06 (×2): qty 1

## 2022-09-06 MED ORDER — METOPROLOL TARTRATE 5 MG/5ML IV SOLN
5.0000 mg | Freq: Once | INTRAVENOUS | Status: AC
  Administered 2022-09-06: 5 mg via INTRAVENOUS
  Filled 2022-09-06: qty 5

## 2022-09-06 MED ORDER — LORAZEPAM 1 MG PO TABS
0.0000 mg | ORAL_TABLET | Freq: Four times a day (QID) | ORAL | Status: DC
  Administered 2022-09-07 (×2): 1 mg via ORAL
  Filled 2022-09-06 (×2): qty 1

## 2022-09-06 MED ORDER — ROSUVASTATIN CALCIUM 20 MG PO TABS
20.0000 mg | ORAL_TABLET | Freq: Every evening | ORAL | Status: DC
  Administered 2022-09-07 – 2022-09-08 (×2): 20 mg via ORAL
  Filled 2022-09-06 (×2): qty 1

## 2022-09-06 MED ORDER — FUROSEMIDE 40 MG PO TABS
40.0000 mg | ORAL_TABLET | Freq: Every day | ORAL | Status: AC
  Administered 2022-09-07 – 2022-09-08 (×2): 40 mg via ORAL
  Filled 2022-09-06 (×2): qty 1

## 2022-09-06 MED ORDER — FUROSEMIDE 10 MG/ML IJ SOLN
60.0000 mg | Freq: Once | INTRAMUSCULAR | Status: AC
  Administered 2022-09-06: 60 mg via INTRAVENOUS
  Filled 2022-09-06: qty 6

## 2022-09-06 MED ORDER — FUROSEMIDE 10 MG/ML IJ SOLN
INTRAMUSCULAR | Status: AC
  Filled 2022-09-06: qty 2

## 2022-09-06 MED ORDER — METOPROLOL SUCCINATE ER 100 MG PO TB24
200.0000 mg | ORAL_TABLET | Freq: Every evening | ORAL | Status: DC
  Administered 2022-09-07 – 2022-09-08 (×2): 200 mg via ORAL
  Filled 2022-09-06 (×2): qty 2

## 2022-09-06 MED ORDER — LORAZEPAM 2 MG/ML IJ SOLN: 0.0000 mg | Freq: Two times a day (BID) | INTRAMUSCULAR | Status: DC

## 2022-09-06 MED ORDER — LORAZEPAM 1 MG PO TABS: 0.0000 mg | ORAL_TABLET | Freq: Two times a day (BID) | ORAL | Status: DC

## 2022-09-06 MED ORDER — THIAMINE HCL 100 MG/ML IJ SOLN
100.0000 mg | Freq: Every day | INTRAMUSCULAR | Status: DC
  Administered 2022-09-06: 100 mg via INTRAVENOUS
  Filled 2022-09-06 (×2): qty 2

## 2022-09-06 NOTE — ED Notes (Signed)
   09/06/22 1809  BiPAP/CPAP/SIPAP  $ Non-Invasive Ventilator  Non-Invasive Vent Set Up  $ Face Mask Medium Yes  BiPAP/CPAP/SIPAP Pt Type Adult  Mask Type Full face mask  Mask Size Medium  Set Rate 10 breaths/min  Respiratory Rate 21 breaths/min  IPAP 10 cmH20  EPAP 5 cmH2O  FiO2 (%) 40 %  Minute Ventilation 12.3  Leak 0  Peak Inspiratory Pressure (PIP) 16  Tidal Volume (Vt) 598  Press High Alarm 16 cmH2O  Press Low Alarm 4 cmH2O  BiPAP/CPAP /SiPAP Vitals  Pulse Rate (!) 118  Resp (!) 25  SpO2 92 %  MEWS Score/Color  MEWS Score 3  MEWS Score Color Yellow   RT to monitor BBS improved, patient tolerating well.

## 2022-09-06 NOTE — Assessment & Plan Note (Signed)
Check lipid panel. Continue with crestor 20 mg.

## 2022-09-06 NOTE — ED Provider Notes (Signed)
[place] [facility] AT [facility] Provider Note   CSN: [id number] Arrival date & time: 09/06/22  1453     History  Chief Complaint  Patient presents with   Nausea    Jasmine Swanson is a 82 y.o. female.  Patient here with nausea, tremors, not feeling well.  Sounds like she has history of panic attacks.  Fairly frequent alcohol use.  He denies any chest pain or shortness of breath or headache or abdominal pain.  She maybe has been under some stress recently her family friend.  Family friend also says maybe she stopped drinking alcohol couple days ago.  She takes Ativan as needed for panic attacks.  Not sure how much he actually drinks alcohol.  She denies any diarrhea fever chills otherwise.  No weakness numbness tingling.  No hallucinations.  The history is provided by the patient.       Home Medications Prior to Admission medications   Medication Sig Start Date End Date Taking? Authorizing Provider  acetaminophen (TYLENOL) 650 MG CR tablet Take 650 mg by mouth every 8 (eight) hours as needed for pain.    [provider]  Ascorbic Acid (VITAMIN C) 1000 MG tablet Take 1,000 mg by mouth daily.    [provider]  aspirin EC 81 MG tablet Take 81 mg by mouth every Monday, Wednesday, and Friday.    [provider]  Calcium Carb-Cholecalciferol (CALCIUM-VITAMIN D) 600-400 MG-UNIT TABS Take 1 tablet by mouth daily.    [provider]  Cholecalciferol 4000 units CAPS Take 4,000 Units by mouth daily.    [provider]  escitalopram (LEXAPRO) 10 MG tablet Take 10 mg by mouth every evening.  04/08/13   [provider]  fish oil-omega-3 fatty acids 1000 MG capsule Take 1 g by mouth daily.     [provider]  Flaxseed, Linseed, (FLAXSEED OIL) 1000 MG CAPS Take 1,000 mg by mouth daily.    [provider]  gabapentin (NEURONTIN) 100 MG capsule Take 1 capsule (100 mg total) by mouth 4 (four) times  daily. Patient taking differently: Take 100 mg by mouth at bedtime. 12/27/19   Hyatt, Max T, DPM  LORazepam (ATIVAN) 0.5 MG tablet Take 0.5 mg by mouth every 8 (eight) hours.    [provider]  Magnesium 250 MG TABS Take 250 mg by mouth daily.     [provider]  metoprolol (TOPROL-XL) 200 MG 24 hr tablet Take 200 mg by mouth every evening.     [provider]  pyridOXINE (VITAMIN B-6) 100 MG tablet Take 100 mg by mouth daily.    [provider]  rosuvastatin (CRESTOR) 20 MG tablet Take 20 mg by mouth every evening.     [provider]      Allergies    Benicar hct [olmesartan medoxomil-hctz], Codeine, Diovan [valsartan], and Niacin and related    Review of Systems   Review of Systems  Physical Exam Updated Vital Signs BP 136/77   Pulse (!) 110   Temp 99 F (37.2 C) (Rectal)   Resp 20   Ht 5\' 2"  (1.575 m)   Wt 55.8 kg   SpO2 99%   BMI 22.50 kg/m  Physical Exam Vitals and nursing note reviewed.  Constitutional:      General: She is not in acute distress.    Appearance: She is well-developed. She is not ill-appearing.  HENT:     Head: Normocephalic and atraumatic.  Nose: Nose normal.     Mouth/Throat:     Mouth: Mucous membranes are moist.  Eyes:     Extraocular Movements: Extraocular movements intact.     Conjunctiva/sclera: Conjunctivae normal.     Pupils: Pupils are equal, round, and reactive to light.  Cardiovascular:     Rate and Rhythm: Normal rate and regular rhythm.     Pulses: Normal pulses.     Heart sounds: Normal heart sounds. No murmur heard. Pulmonary:     Effort: Pulmonary effort is normal. No respiratory distress.     Breath sounds: Normal breath sounds.  Abdominal:     Palpations: Abdomen is soft.     Tenderness: There is no abdominal tenderness.  Musculoskeletal:        General: No swelling.     Cervical back: Normal range of motion and neck supple.  Skin:    General: Skin is warm and dry.      Capillary Refill: Capillary refill takes less than 2 seconds.  Neurological:     General: No focal deficit present.     Mental Status: She is alert and oriented to person, place, and time.     Cranial Nerves: No cranial nerve deficit.     Sensory: No sensory deficit.     Motor: No weakness.     Coordination: Coordination normal.     Comments: She appears a little bit uncomfortable but she has normal strength and sensation throughout, answers questions appropriately  Psychiatric:        Mood and Affect: Mood normal.     ED Results / Procedures / Treatments   Labs (all labs ordered are listed, but only abnormal results are displayed) Labs Reviewed  COMPREHENSIVE METABOLIC PANEL - Abnormal; Notable for the following components:      Result Value   CO2 20 (*)    Glucose, Bld 117 (*)    Calcium 8.5 (*)    All other components within normal limits  BRAIN NATRIURETIC PEPTIDE - Abnormal; Notable for the following components:   B Natriuretic Peptide 446.7 (*)    All other components within normal limits  I-STAT VENOUS BLOOD GAS, ED - Abnormal; Notable for the following components:   pCO2, Ven 41.9 (*)    pO2, Ven 46 (*)    Acid-base deficit 6.0 (*)    Calcium, Ion 1.08 (*)    All other components within normal limits  TROPONIN I (HIGH SENSITIVITY) - Abnormal; Notable for the following components:   Troponin I (High Sensitivity) 31 (*)    All other components within normal limits  TROPONIN I (HIGH SENSITIVITY) - Abnormal; Notable for the following components:   Troponin I (High Sensitivity) 248 (*)    All other components within normal limits  RESP PANEL BY RT-PCR (RSV, FLU A&B, COVID)  [id number]  CBC WITH DIFFERENTIAL/PLATELET  LIPASE, BLOOD  TROPONIN I (HIGH SENSITIVITY)    EKG None  Radiology CT Angio Chest PE W and/or Wo Contrast  Result Date: 09/06/2022 CLINICAL DATA:  Pulmonary embolism (PE) suspected, high prob EXAM: CT ANGIOGRAPHY CHEST WITH CONTRAST TECHNIQUE:  Multidetector CT imaging of the chest was performed using the standard protocol during bolus administration of intravenous contrast. Multiplanar CT image reconstructions and MIPs were obtained to evaluate the vascular anatomy. RADIATION DOSE REDUCTION: This exam was performed according to the departmental dose-optimization program which includes automated exposure control, adjustment of the mA and/or kV according to patient size and/or use of iterative reconstruction technique. CONTRAST:  75mL  OMNIPAQUE IOHEXOL 350 MG/ML SOLN COMPARISON:  None Available. FINDINGS: Cardiovascular: Satisfactory opacification of the pulmonary arteries to the segmental level. No evidence of pulmonary embolism. Normal heart size. No significant pericardial effusion. The thoracic aorta is normal in caliber. Severe atherosclerotic plaque of the thoracic aorta. No coronary artery calcifications. Mediastinum/Nodes: Upper limits of normal bilateral hilar lymph nodes likely reactive in etiology. No enlarged mediastinal or axillary lymph nodes. Thyroid gland, trachea, and esophagus demonstrate no significant findings. Lungs/Pleura: Diffuse interlobular septal wall thickening. Peribronchovascular ground-glass and consolidative airspace opacities most prominent within the lower lobes. No pulmonary nodule. No pulmonary mass. Bilateral trace to small volume pleural effusions. No pneumothorax. Upper Abdomen: No acute abnormality. Musculoskeletal: No chest wall abnormality. No suspicious lytic or blastic osseous lesions. No acute displaced fracture. Multilevel degenerative changes of the spine. Total right shoulder arthroplasty. Review of the MIP images confirms the above findings. IMPRESSION: 1. Pulmonary edema with likely superimposed infection most prominent within the bilateral lower lobes. 2. Bilateral trace to small volume pleural effusions. 3. Upper limits of normal bilateral hilar lymph nodes likely reactive in etiology. 4.  Aortic  Atherosclerosis (ICD10-I70.0). Electronically Signed   By: Tish Frederickson M.D.   On: 09/06/2022 18:22   DG Chest 1 View  Result Date: 09/06/2022 CLINICAL DATA:  HTN (hypertension) EXAM: CHEST  1 VIEW COMPARISON:  Chest x-ray November 10, 2017. FINDINGS: Small pleural effusion. Left greater than right bibasilar opacities. No visible pneumothorax. Cardiomediastinal silhouette is within normal limits. Right reverse shoulder arthroplasty. S-shaped thoracolumbar curvature. Polyarticular degenerative change. IMPRESSION: Small left pleural effusion. Left greater than right bibasilar opacities could represent atelectasis, aspiration, and/or pneumonia. Electronically Signed   By: Feliberto Harts M.D.   On: 09/06/2022 15:54    Procedures Procedures    Medications Ordered in ED Medications  LORazepam (ATIVAN) injection 0-4 mg ( Intravenous Not Given 09/06/22 1929)    Or  LORazepam (ATIVAN) tablet 0-4 mg ( Oral See Alternative 09/06/22 1929)  LORazepam (ATIVAN) injection 0-4 mg (has no administration in time range)    Or  LORazepam (ATIVAN) tablet 0-4 mg (has no administration in time range)  thiamine (VITAMIN B1) tablet 100 mg ( Oral See Alternative 09/06/22 1925)    Or  thiamine (VITAMIN B1) injection 100 mg (100 mg Intravenous Given 09/06/22 1925)  Ampicillin-Sulbactam (UNASYN) 3 g in sodium chloride 0.9 % 100 mL IVPB (3 g Intravenous New Bag/Given 09/06/22 1929)  LORazepam (ATIVAN) tablet 1 mg (1 mg Oral Given 09/06/22 1635)  sodium chloride 0.9 % bolus 1,000 mL (0 mLs Intravenous Stopped 09/06/22 1727)  furosemide (LASIX) injection 60 mg ( Intravenous Canceled Entry 09/06/22 1815)  iohexol (OMNIPAQUE) 350 MG/ML injection 75 mL (75 mLs Intravenous Contrast Given 09/06/22 1744)    ED Course/ Medical Decision Making/ A&P                             Medical Decision Making Amount and/or Complexity of Data Reviewed Labs: ordered. Radiology: ordered.  Risk OTC drugs. Prescription drug  management. Decision regarding hospitalization.   Eugenio Hoes is here with nausea and anxiety feeling.  Patient states she has not felt well.  She has been having some nausea and vomiting for several weeks.  She has been having some loose stools.  Family states that she stopped drinking alcohol a few days ago after being a fairly heavy drinker.  She has not had much to eat or drink.  She denies  any chest pain.  She states she feels really anxious.  She is having some tremors.  She denies any hallucinations.  Her vital signs are normal.  EKG shows sinus rhythm.  No obvious ischemic changes.  Differential could be viral GI illness versus may be withdrawal from alcohol versus less likely cardiac process.  She takes Ativan as needed.  Will give her dose of Ativan give fluid bolus given her GI losses and check basic labs and chest x-ray.  Shortly after patient got some IV fluids she got very short of breath and tachycardic.  She got hypoxic in the 80s.  She had more of a wet sounding cough.  Ultimately I started to get concern for may be pulmonary edema.  This is likely mass from her anxiety and tremors when she first got here.  She not have any peripheral edema still does not have any.  Put her on BiPAP.  Gave her dose IV Lasix and stopped IV fluids.  She had about 500 cc.  History of breast cancer.  History of high cholesterol, anxiety, atrial tachycardia.  She got much more comfortable on BiPAP and Lasix.  Blood pressure has come down to 115/77.  Heart rate in the low 100s.  96% on BiPAP.  Blood gas was obtained with a pH of 7.29.  CO2 was normal.  Proceed to CT PE study given her history.  This and the chest x-ray per radiology report overall seems most consistent with pulmonary edema.  Troponin went from 31 to  248.  BNP elevated to around 500.  Does not seem like she has a history of heart failure.  She was fairly tachycardic shortly after she came in.  Overall I think that this could be NSTEMI versus  heart failure.  She is a heavy drinker and not sure this could be an alcohol related heart failure process as well.  I will talk with cardiology team to discuss may be starting anticoagulation.  She continues to be stable on BiPAP.  Talked with Dr. Welton Flakes cardiology.  Recommends continuing to trend troponin get echocardiogram in the morning.  Continue to treat his heart failure exacerbation.  If troponin makes significant jump into the thousands or this there is significant abnormalities on echo consider further cardiac workup with cardiology consultation.  This chart was dictated using voice recognition software.  Despite best efforts to proofread,  errors can occur which can change the documentation meaning.         Final Clinical Impression(s) / ED Diagnoses Final diagnoses:  Acute respiratory failure with hypoxia  Hypervolemia, unspecified hypervolemia type  Elevated troponin    Rx / DC Orders ED Discharge Orders     None         Virgina Norfolk, DO 09/06/22 1956

## 2022-09-06 NOTE — Assessment & Plan Note (Signed)
  Lab Results  Component Value Date/Time   TROPONINIHS 476 (HH) 09/06/2022 08:07 PM   TROPONINIHS 248 (HH) 09/06/2022 05:36 PM   TROPONINIHS 31 (H) 09/06/2022 03:23 PM   Unclear the cause of her rise of troponin. Probably demand ischemia from hypoxia/pulmonary edema. Check echo. Check lipid panel.  Give ASA 325 now and then 81 mg daily.

## 2022-09-06 NOTE — Assessment & Plan Note (Addendum)
Unclear the reason for her flash pulmonary edema. Continue with po lasix. Check echo. No prior hx of CHF. I don't think she has pneumonia. Will stop IV abx. Check procalcitonin in AM.

## 2022-09-06 NOTE — Assessment & Plan Note (Signed)
Admit to telemetry bed. Weaned off bipap prior to transfer to [facility]. Wean to RA. Pt does not wear home O2.

## 2022-09-06 NOTE — ED Notes (Signed)
Called lab to add on BNP. ?

## 2022-09-06 NOTE — ED Notes (Signed)
Patient d/c'ed from Bipap to 4L/[place]. Patient's urine output of . BBS clear with slight crackles. Patient  RR 19/ 98% oxygen saturation, HR 109. Patient talking in complete sentences without difficulty. Patient resting comfortably.

## 2022-09-06 NOTE — ED Triage Notes (Signed)
Pt has been having issues with nausea and vomiting for several weeks.  She has been eating a bland diet and forcing fluid but she has been nauseated frequently and she has been loose stools.  Today pt noted that she had elevated BP, she has been taking medications as prescribed.

## 2022-09-06 NOTE — ED Notes (Signed)
Pt transported to imaging on monitoring with RN escort.

## 2022-09-06 NOTE — ED Notes (Signed)
[facility] notified of need for transport. °

## 2022-09-06 NOTE — ED Notes (Signed)
Attempted report to receiving unit, no answer.

## 2022-09-06 NOTE — Assessment & Plan Note (Signed)
Verified the patient is DNR status.  She states that she has a yellow DNR form taped up to her refrigerator door.  She states that she did not bring it to the hospital.

## 2022-09-06 NOTE — Subjective & Objective (Signed)
Reason for transfer:respiratory failure HPI: 82 year old female history of hypertension, hyperlipidemia, prior history of breast cancer presents to the ER today with feeling shaky at home.  She states that she woke up and felt somewhat strange.  She had some abdominal discomfort and irritable bowel like syndrome symptoms.  She denies any diarrhea but just felt that her intestines were moving around a lot.  She denies any fever, chills.  She called her friend due to shaking at home.  Her friend brought her to the ER.  While in the ER and undergoing workup, she received some IV fluids.  She began feeling short of breath afterwards.  She became hypoxic.  She required BiPAP.  Chest x-ray showed pulmonary edema and she was started on IV Lasix.  She was weaned off BiPAP.  Patient does not have a history of CHF.  Her initial troponin was 31.  Increased to 248 and then up to 476.  EKG showed normal sinus rhythm with an IVCD.  This is different from her EKG from February 2023.  Patient denies any history of cardiac disease.  She states that she has had 2 cardiac catheterizations in the past but has not had any stents placed.  Patient transferred to [facility].  As far as her alcohol intake is concerned, the patient states that she has 1 glass of wine each day.  Does not drink any beer or liquor.  Patient states that she does not have a problem with alcohol.

## 2022-09-06 NOTE — H&P (Addendum)
History and Physical    Jasmine Swanson ZOX:[id number] DOB: 08/16/1940 DOA: 09/06/2022  DOS: the patient was seen and examined on 09/06/2022  PCP: Gweneth Dimitri, MD   Patient coming from: Home  I have personally briefly reviewed patient's old medical records in [facility]  Reason for transfer:respiratory failure HPI: 82 year old female history of hypertension, hyperlipidemia, prior history of breast cancer presents to the ER today with feeling shaky at home.  She states that she woke up and felt somewhat strange.  She had some abdominal discomfort and irritable bowel like syndrome symptoms.  She denies any diarrhea but just felt that her intestines were moving around a lot.  She denies any fever, chills.  She called her friend due to shaking at home.  Her friend brought her to the ER.  While in the ER and undergoing workup, she received some IV fluids.  She began feeling short of breath afterwards.  She became hypoxic.  She required BiPAP.  Chest x-ray showed pulmonary edema and she was started on IV Lasix.  She was weaned off BiPAP.  Patient does not have a history of CHF.  Her initial troponin was 31.  Increased to 248 and then up to 476.  EKG showed normal sinus rhythm with an IVCD.  This is different from her EKG from February 2023.  Patient denies any history of cardiac disease.  She states that she has had 2 cardiac catheterizations in the past but has not had any stents placed.  Patient transferred to [facility].  As far as her alcohol intake is concerned, the patient states that she has 1 glass of wine each day.  Does not drink any beer or liquor.  Patient states that she does not have a problem with alcohol.   ED Course: received 500 ml IVF and developed acute pulmonary edema. Started on bipap and given 60 mg IV lasix. CTPA negative for PE.  Review of Systems:  Review of Systems  Constitutional: Negative.  Negative for malaise/fatigue.  HENT: Negative.     Eyes: Negative.   Respiratory:  Positive for shortness of breath.   Cardiovascular:  Negative for leg swelling.  Gastrointestinal:        Abdominal "gurgling"  Genitourinary: Negative.   Musculoskeletal: Negative.   Skin: Negative.   Neurological: Negative.   Endo/Heme/Allergies: Negative.   Psychiatric/Behavioral: Negative.    All other systems reviewed and are negative.   Past Medical History:  Diagnosis Date   Anxiety    Arthritis    Bundle branch block, left    Cancer    Complication of anesthesia    laryngeal spams 50 yrs ago   Depression    Dysphagia    Essential hypertension 06/17/2013   GERD (gastroesophageal reflux disease)    Hyperlipidemia    Kyphosis    PAC (premature atrial contraction) 06/17/2013   PAT (paroxysmal atrial tachycardia) 06/17/2013   Plantar fasciitis of right foot    Preop cardiovascular exam 10/16/2017   Primary localized osteoarthrosis of right shoulder 11/10/2017   Right shoulder pain 01/20/2012    Past Surgical History:  Procedure Laterality Date   ABDOMINAL HYSTERECTOMY     bladder tac     BREAST BIOPSY Right 06/25/2021   BREAST LUMPECTOMY Right 07/09/2021   BREAST LUMPECTOMY WITH RADIOACTIVE SEED LOCALIZATION Right 07/09/2021   Procedure: RIGHT BREAST LUMPECTOMY WITH RADIOACTIVE SEED LOCALIZATION;  Surgeon: Manus Rudd, MD;  Location: [facility];  Service: General;  Laterality: Right;  CARDIAC CATHETERIZATION     TONSILLECTOMY     TOTAL SHOULDER ARTHROPLASTY Right 11/10/2017   Procedure: TOTAL SHOULDER ARTHROPLASTY;  Surgeon: Teryl Lucy, MD;  Location: [facility] OR;  Service: Orthopedics;  Laterality: Right;     reports that she has quit smoking. Her smoking use included cigarettes. She has a 7.50 pack-year smoking history. She has never used smokeless tobacco. She reports current alcohol use. She reports that she does not use drugs.  Allergies  Allergen Reactions   Benicar Hct [Olmesartan Medoxomil-Hctz]      Edema of lower extremities    Codeine Other (See Comments)    Drops blood pressure   Diovan [Valsartan] Other (See Comments)    Palpitations.     Niacin And Related Other (See Comments)    Turns patient red, makes her pass out    Family History  Problem Relation Age of Onset   Hyperlipidemia Mother    Hypertension Mother    Heart attack Mother    Colon cancer Father        dx. 57s   Prostate cancer Father        dx. 90s   Breast cancer Cousin        dx. 35s, paternal   Sudden death Neg Hx    Diabetes Neg Hx     Prior to Admission medications   Medication Sig Start Date End Date Taking? Authorizing Provider  acetaminophen (TYLENOL) 650 MG CR tablet Take 650 mg by mouth every 8 (eight) hours as needed for pain.    [provider]  Ascorbic Acid (VITAMIN C) 1000 MG tablet Take 1,000 mg by mouth daily.    [provider]  aspirin EC 81 MG tablet Take 81 mg by mouth every Monday, Wednesday, and Friday.    [provider]  Calcium Carb-Cholecalciferol (CALCIUM-VITAMIN D) 600-400 MG-UNIT TABS Take 1 tablet by mouth daily.    [provider]  Cholecalciferol 4000 units CAPS Take 4,000 Units by mouth daily.    [provider]  escitalopram (LEXAPRO) 10 MG tablet Take 10 mg by mouth every evening.  04/08/13   [provider]  fish oil-omega-3 fatty acids 1000 MG capsule Take 1 g by mouth daily.     [provider]  Flaxseed, Linseed, (FLAXSEED OIL) 1000 MG CAPS Take 1,000 mg by mouth daily.    [provider]  gabapentin (NEURONTIN) 100 MG capsule Take 1 capsule (100 mg total) by mouth 4 (four) times daily. Patient taking differently: Take 100 mg by mouth at bedtime. 12/27/19   Hyatt, Max T, DPM  LORazepam (ATIVAN) 0.5 MG tablet Take 0.5 mg by mouth every 8 (eight) hours.    [provider]  Magnesium 250 MG TABS Take 250 mg by mouth daily.     [provider]  metoprolol (TOPROL-XL) 200 MG 24 hr  tablet Take 200 mg by mouth every evening.     [provider]  pyridOXINE (VITAMIN B-6) 100 MG tablet Take 100 mg by mouth daily.    [provider]  rosuvastatin (CRESTOR) 20 MG tablet Take 20 mg by mouth every evening.     [provider]    Physical Exam: Vitals:   09/06/22 2047 09/06/22 2100 09/06/22 2129 09/06/22 2151  BP: (!) 167/87  (!) 170/79 134/73  Pulse: (!) 110  (!) 112 87  Resp: 18     Temp: 97.6 F (36.4 C)   97.6 F (36.4 C)  TempSrc: Oral  Oral  SpO2: 99%   100%  Weight:  55.8 kg    Height:  5\' 3"  (1.6 m)      Physical Exam Vitals and nursing note reviewed.  Constitutional:      General: She is not in acute distress.    Appearance: Normal appearance. She is normal weight. She is not ill-appearing, toxic-appearing or diaphoretic.  HENT:     Head: Normocephalic and atraumatic.     Nose: Nose normal.  Eyes:     General: No scleral icterus. Cardiovascular:     Rate and Rhythm: Regular rhythm. Tachycardia present.     Pulses: Normal pulses.  Pulmonary:     Effort: Pulmonary effort is normal. No tachypnea.     Breath sounds: Examination of the right-lower field reveals rales. Examination of the left-lower field reveals rales. Rales present. No rhonchi.  Abdominal:     General: Abdomen is flat. Bowel sounds are normal. There is no distension.     Palpations: Abdomen is soft.     Tenderness: There is no abdominal tenderness.  Musculoskeletal:     Right lower leg: No edema.     Left lower leg: No edema.  Skin:    General: Skin is warm and dry.     Capillary Refill: Capillary refill takes less than 2 seconds.  Neurological:     General: No focal deficit present.     Mental Status: She is alert and oriented to person, place, and time.      Labs on Admission: I have personally reviewed following labs and imaging studies  CBC: Recent Labs  Lab 09/06/22 1523 09/06/22 1807  WBC 6.4  --   NEUTROABS 4.6  --   HGB 12.7 12.9  HCT  37.5 38.0  MCV 94.2  --   PLT 159  --    Basic Metabolic Panel: Recent Labs  Lab 09/06/22 1523 09/06/22 1807  NA 137 138  K 3.6 3.7  CL 105  --   CO2 20*  --   GLUCOSE 117*  --   BUN 11  --   CREATININE 0.79  --   CALCIUM 8.5*  --    GFR: Estimated Creatinine Clearance: 44.1 mL/min (by C-G formula based on SCr of 0.79 mg/dL). Liver Function Tests: Recent Labs  Lab 09/06/22 1523  AST 30  ALT 21  ALKPHOS 45  BILITOT 1.0  PROT 7.1  ALBUMIN 4.1   Recent Labs  Lab 09/06/22 1523  LIPASE 24   No results for input(s): "AMMONIA" in the last 168 hours. Coagulation Profile: No results for input(s): "INR", "PROTIME" in the last 168 hours. Cardiac Enzymes: Recent Labs  Lab 09/06/22 1523 09/06/22 1736 09/06/22 2007  TROPONINIHS 31* 248* 476*   BNP (last 3 results) Recent Labs    09/06/22 1523  BNP 446.7*   Radiological Exams on Admission: I have personally reviewed images CT Angio Chest PE W and/or Wo Contrast  Result Date: 09/06/2022 CLINICAL DATA:  Pulmonary embolism (PE) suspected, high prob EXAM: CT ANGIOGRAPHY CHEST WITH CONTRAST TECHNIQUE: Multidetector CT imaging of the chest was performed using the standard protocol during bolus administration of intravenous contrast. Multiplanar CT image reconstructions and MIPs were obtained to evaluate the vascular anatomy. RADIATION DOSE REDUCTION: This exam was performed according to the departmental dose-optimization program which includes automated exposure control, adjustment of the mA and/or kV according to patient size and/or use of iterative reconstruction technique. CONTRAST:  54mL OMNIPAQUE IOHEXOL 350 MG/ML SOLN COMPARISON:  None Available.  FINDINGS: Cardiovascular: Satisfactory opacification of the pulmonary arteries to the segmental level. No evidence of pulmonary embolism. Normal heart size. No significant pericardial effusion. The thoracic aorta is normal in caliber. Severe atherosclerotic plaque of the thoracic  aorta. No coronary artery calcifications. Mediastinum/Nodes: Upper limits of normal bilateral hilar lymph nodes likely reactive in etiology. No enlarged mediastinal or axillary lymph nodes. Thyroid gland, trachea, and esophagus demonstrate no significant findings. Lungs/Pleura: Diffuse interlobular septal wall thickening. Peribronchovascular ground-glass and consolidative airspace opacities most prominent within the lower lobes. No pulmonary nodule. No pulmonary mass. Bilateral trace to small volume pleural effusions. No pneumothorax. Upper Abdomen: No acute abnormality. Musculoskeletal: No chest wall abnormality. No suspicious lytic or blastic osseous lesions. No acute displaced fracture. Multilevel degenerative changes of the spine. Total right shoulder arthroplasty. Review of the MIP images confirms the above findings. IMPRESSION: 1. Pulmonary edema with likely superimposed infection most prominent within the bilateral lower lobes. 2. Bilateral trace to small volume pleural effusions. 3. Upper limits of normal bilateral hilar lymph nodes likely reactive in etiology. 4.  Aortic Atherosclerosis (ICD10-I70.0). Electronically Signed   By: Tish Frederickson M.D.   On: 09/06/2022 18:22   DG Chest 1 View  Result Date: 09/06/2022 CLINICAL DATA:  HTN (hypertension) EXAM: CHEST  1 VIEW COMPARISON:  Chest x-ray November 10, 2017. FINDINGS: Small pleural effusion. Left greater than right bibasilar opacities. No visible pneumothorax. Cardiomediastinal silhouette is within normal limits. Right reverse shoulder arthroplasty. S-shaped thoracolumbar curvature. Polyarticular degenerative change. IMPRESSION: Small left pleural effusion. Left greater than right bibasilar opacities could represent atelectasis, aspiration, and/or pneumonia. Electronically Signed   By: Feliberto Harts M.D.   On: 09/06/2022 15:54    EKG: My personal interpretation of EKG shows: sinus tachycardia    Assessment/Plan Principal Problem:   Acute  respiratory failure with hypoxia Active Problems:   Acute pulmonary edema   Elevated troponin   Essential hypertension   Hyperlipidemia   DNR (do not resuscitate)    Assessment and Plan: * Acute respiratory failure with hypoxia Admit to telemetry bed. Weaned off bipap prior to transfer to [facility]. Wean to RA. Pt does not wear home O2.  Elevated troponin  Lab Results  Component Value Date/Time   TROPONINIHS 476 (HH) 09/06/2022 08:07 PM   TROPONINIHS 248 (HH) 09/06/2022 05:36 PM   TROPONINIHS 31 (H) 09/06/2022 03:23 PM   Unclear the cause of her rise of troponin. Probably demand ischemia from hypoxia/pulmonary edema. Check echo. Check lipid panel.  Give ASA 325 now and then 81 mg daily.   Acute pulmonary edema Unclear the reason for her flash pulmonary edema. Continue with po lasix. Check echo. No prior hx of CHF. I don't think she has pneumonia. Will stop IV abx. Check procalcitonin in AM.  Hyperlipidemia Check lipid panel. Continue with crestor 20 mg.  Essential hypertension Pt with HR 117 and BP of 170/79. Pt thinks she missed her toprol-xl dose. Will give 5 mg IV x 1 now. Then give her 200 mg toprol-xl dose in 1 hour.  DNR (do not resuscitate) Verified the patient is DNR status.  She states that she has a yellow DNR form taped up to her refrigerator door.  She states that she did not bring it to the hospital.   DVT prophylaxis: SQ Heparin Code Status: DNR/DNI(Do NOT Intubate) Family Communication: no family at bedside  Disposition Plan: return home  Consults called: EDP has consulted cardiology  Admission status: Observation, Telemetry bed   Carollee Herter, DO [facility]  09/06/2022, 10:01 PM

## 2022-09-06 NOTE — Progress Notes (Signed)
Continuous bipap order, patient is unlabored and in no distress. Patient prefers not wearing bipap tonight. Machine is in room and set up. Will continue to monitor.

## 2022-09-06 NOTE — ED Notes (Signed)
Called to bs approx 1715, SpO2 not responding to O2 therapy with [place] at 2LPM.  Full body tremors noted placed on 6LPM [place] Spo2 90%, RR 30, HR 120s, BBS with coarse rales.  MD made aware.  Placed on NRB mask for CT scan, SPO2 improve to mid 90s. Placed on BiPAP initial setting IPAP 10, EPAP 5 O2 60%.  Tolerating well.  See flow sheet documentation for changes

## 2022-09-06 NOTE — Assessment & Plan Note (Signed)
Pt with HR 117 and BP of 170/79. Pt thinks she missed her toprol-xl dose. Will give 5 mg IV x 1 now. Then give her 200 mg toprol-xl dose in 1 hour.

## 2022-09-06 NOTE — ED Notes (Signed)
Daughter at bedside.

## 2022-09-06 NOTE — ED Notes (Signed)
Pt O2 noted to be 83% on room air, good waveform noted.  Pt with full body tremors, c/o feeling cold. Pt placed on 3L O2 via [place] with minimal improvement. Pt rectal temp checked, 99.0 F. HR elevated at 126 bpm.  Pt AOx4.  RT called to bedside to assess pt, EDP [place] made aware.

## 2022-09-07 ENCOUNTER — Inpatient Hospital Stay (HOSPITAL_COMMUNITY): Payer: Medicare Other

## 2022-09-07 ENCOUNTER — Other Ambulatory Visit: Payer: Self-pay

## 2022-09-07 DIAGNOSIS — J810 Acute pulmonary edema: Secondary | ICD-10-CM | POA: Diagnosis not present

## 2022-09-07 DIAGNOSIS — J9601 Acute respiratory failure with hypoxia: Secondary | ICD-10-CM | POA: Diagnosis not present

## 2022-09-07 LAB — COMPREHENSIVE METABOLIC PANEL
ALT: 21 U/L (ref 0–44)
AST: 30 U/L (ref 15–41)
Albumin: 3.6 g/dL (ref 3.5–5.0)
Alkaline Phosphatase: 40 U/L (ref 38–126)
Anion gap: 10 (ref 5–15)
BUN: 10 mg/dL (ref 8–23)
CO2: 23 mmol/L (ref 22–32)
Calcium: 8.2 mg/dL — ABNORMAL LOW (ref 8.9–10.3)
Chloride: 102 mmol/L (ref 98–111)
Creatinine, Ser: 0.91 mg/dL (ref 0.44–1.00)
GFR, Estimated: >60 mL/min (ref >60)
Glucose, Bld: 122 mg/dL — ABNORMAL HIGH (ref 70–99)
Potassium: 3.6 mmol/L (ref 3.5–5.1)
Sodium: 135 mmol/L (ref 135–145)
Total Bilirubin: 1.1 mg/dL (ref 0.3–1.2)
Total Protein: 6.3 g/dL — ABNORMAL LOW (ref 6.5–8.1)

## 2022-09-07 LAB — ECHOCARDIOGRAM COMPLETE
AR max vel: 2 cm2
AV Peak grad: 6.4 mmHg
Ao pk vel: 1.26 m/s
Area-P 1/2: 5.09 cm2
Calc EF: 37.1 %
Height: 63 in
S' Lateral: 3.5 cm
Single Plane A2C EF: 40.9 %
Single Plane A4C EF: 35.8 %
Weight: 1926.4 oz

## 2022-09-07 LAB — CBC WITH DIFFERENTIAL/PLATELET
Abs Immature Granulocytes: 0.02 10*3/uL (ref 0.00–0.07)
Basophils Absolute: 0 10*3/uL (ref 0.0–0.1)
Basophils Relative: 0 %
Eosinophils Absolute: 0 10*3/uL (ref 0.0–0.5)
Eosinophils Relative: 0 %
HCT: 34.8 % — ABNORMAL LOW (ref 36.0–46.0)
Hemoglobin: 12.1 g/dL (ref 12.0–15.0)
Immature Granulocytes: 0 %
Lymphocytes Relative: 15 %
Lymphs Abs: 1.1 10*3/uL (ref 0.7–4.0)
MCH: 32.5 pg (ref 26.0–34.0)
MCHC: 34.8 g/dL (ref 30.0–36.0)
MCV: 93.5 fL (ref 80.0–100.0)
Monocytes Absolute: 0.4 10*3/uL (ref 0.1–1.0)
Monocytes Relative: 6 %
Neutro Abs: 5.6 10*3/uL (ref 1.7–7.7)
Neutrophils Relative %: 79 %
Platelets: 172 10*3/uL (ref 150–400)
RBC: 3.72 MIL/uL — ABNORMAL LOW (ref 3.87–5.11)
RDW: 12.4 % (ref 11.5–15.5)
WBC: 7.1 10*3/uL (ref 4.0–10.5)
nRBC: 0 % (ref 0.0–0.2)

## 2022-09-07 LAB — PROCALCITONIN: Procalcitonin: <0.1 ng/mL

## 2022-09-07 LAB — TROPONIN I (HIGH SENSITIVITY)
Troponin I (High Sensitivity): 867 ng/L (ref <18)
Troponin I (High Sensitivity): 695 ng/L (ref <18)

## 2022-09-07 LAB — MAGNESIUM: Magnesium: 1.6 mg/dL — ABNORMAL LOW (ref 1.7–2.4)

## 2022-09-07 LAB — GLUCOSE, CAPILLARY: Glucose-Capillary: 114 mg/dL — ABNORMAL HIGH (ref 70–99)

## 2022-09-07 MED ORDER — ACETAMINOPHEN 325 MG PO TABS
650.0000 mg | ORAL_TABLET | Freq: Four times a day (QID) | ORAL | Status: DC | PRN
  Administered 2022-09-09: 650 mg via ORAL
  Filled 2022-09-07: qty 2

## 2022-09-07 MED ORDER — MAGNESIUM SULFATE 2 GM/50ML IV SOLN
2.0000 g | Freq: Once | INTRAVENOUS | Status: AC
  Administered 2022-09-07: 2 g via INTRAVENOUS
  Filled 2022-09-07: qty 50

## 2022-09-07 MED ORDER — HEPARIN (PORCINE) 25000 UT/250ML-% IV SOLN
550.0000 [IU]/h | INTRAVENOUS | Status: DC
  Administered 2022-09-07: 650 [IU]/h via INTRAVENOUS
  Filled 2022-09-07: qty 250

## 2022-09-07 MED ORDER — ONDANSETRON HCL 4 MG PO TABS: 4.0000 mg | ORAL_TABLET | Freq: Four times a day (QID) | ORAL | Status: DC | PRN

## 2022-09-07 MED ORDER — ONDANSETRON HCL 4 MG/2ML IJ SOLN: 4.0000 mg | Freq: Four times a day (QID) | INTRAMUSCULAR | Status: DC | PRN

## 2022-09-07 MED ORDER — SODIUM CHLORIDE 0.9 % IV SOLN: 250.0000 mL | INTRAVENOUS | Status: DC | PRN

## 2022-09-07 MED ORDER — HEPARIN BOLUS VIA INFUSION
3000.0000 [IU] | Freq: Once | INTRAVENOUS | Status: AC
  Administered 2022-09-07: 3000 [IU] via INTRAVENOUS
  Filled 2022-09-07: qty 3000

## 2022-09-07 MED ORDER — PANTOPRAZOLE SODIUM 40 MG IV SOLR
40.0000 mg | Freq: Two times a day (BID) | INTRAVENOUS | Status: DC
  Administered 2022-09-07 – 2022-09-09 (×5): 40 mg via INTRAVENOUS
  Filled 2022-09-07 (×5): qty 10

## 2022-09-07 MED ORDER — SODIUM CHLORIDE 0.9% FLUSH: 3.0000 mL | INTRAVENOUS | Status: DC | PRN

## 2022-09-07 MED ORDER — ACETAMINOPHEN 650 MG RE SUPP: 650.0000 mg | Freq: Four times a day (QID) | RECTAL | Status: DC | PRN

## 2022-09-07 MED ORDER — FUROSEMIDE 10 MG/ML IJ SOLN
40.0000 mg | Freq: Once | INTRAMUSCULAR | Status: AC
  Administered 2022-09-07: 40 mg via INTRAVENOUS
  Filled 2022-09-07: qty 4

## 2022-09-07 MED ORDER — MELATONIN 5 MG PO TABS
10.0000 mg | ORAL_TABLET | Freq: Every evening | ORAL | Status: DC | PRN
  Administered 2022-09-08: 10 mg via ORAL
  Filled 2022-09-07 (×2): qty 2

## 2022-09-07 MED ORDER — HEPARIN SODIUM (PORCINE) 5000 UNIT/ML IJ SOLN
5000.0000 [IU] | Freq: Three times a day (TID) | INTRAMUSCULAR | Status: DC
  Administered 2022-09-07 (×3): 5000 [IU] via SUBCUTANEOUS
  Filled 2022-09-07 (×3): qty 1

## 2022-09-07 MED ORDER — SODIUM CHLORIDE 0.9% FLUSH
3.0000 mL | Freq: Two times a day (BID) | INTRAVENOUS | Status: DC
  Administered 2022-09-07 – 2022-09-09 (×4): 3 mL via INTRAVENOUS

## 2022-09-07 MED ORDER — SODIUM CHLORIDE 0.9 % IV SOLN: INTRAVENOUS | Status: DC

## 2022-09-07 NOTE — Progress Notes (Signed)
Pt called out and stated that she "feels funny" , states that it began with nausea and then she felt shaky like when she first came into hospital HR went up to 118 but did not sustain. Blood glucose checked (114). Provider Imogene Burn notified, see new orders.   09/07/22 0140  Vitals  BP (!) 141/75  MAP (mmHg) 92  BP Location Left Arm  BP Method Automatic  Patient Position (if appropriate) Lying  Pulse Rate (!) 104  Pulse Rate Source Monitor  Resp 20  Level of Consciousness  Level of Consciousness Alert  MEWS COLOR  MEWS Score Color Green  Oxygen Therapy  SpO2 99 %  O2 Device Nasal Cannula  O2 Flow Rate (L/min) 1 L/min  MEWS Score  MEWS Temp 0  MEWS Systolic 0  MEWS Pulse 1  MEWS RR 0  MEWS LOC 0  MEWS Score 1

## 2022-09-07 NOTE — Plan of Care (Signed)

## 2022-09-07 NOTE — Evaluation (Signed)
Occupational Therapy Evaluation Patient Details Name: Jasmine Swanson MRN: [id number] DOB: 27-Sep-1940 Today's Date: 09/07/2022   History of Present Illness 82 year old female who presented after feeling shaky and weak at home. Chest x-ray showed pulmonary edema, further work up revealed elevated troponin and alcohol dependence. PMHx: hypertension, hyperlipidemia, anxiety, depression, prior history of breast cancer, diplopia, etoh   Clinical Impression   [name] was evaluated s/p the above admission list. She is typically indep at baseline, she lives alone and reports furniture surfs when walking to prevent a fall. Upon evaluation she was limited by impiared cogntin and unsteady gait. Overall she needed min G for mobility and verbal cues to imitate and sustain tasks, unable to dual task with mobility. Due to the deficits listed below she also needs up to min G for ADLs with cues. Pt will benefit from continued acute OT services but does not have follow up OT needs.       Recommendations for follow up therapy are one component of a multi-disciplinary discharge planning process, led by the attending physician.  Recommendations may be updated based on patient status, additional functional criteria and insurance authorization.   Assistance Recommended at Discharge PRN  Patient can return home with the following A little help with walking and/or transfers;A little help with bathing/dressing/bathroom;Assistance with cooking/housework;Assist for transportation;Help with stairs or ramp for entrance    Functional Status Assessment  Patient has had a recent decline in their functional status and demonstrates the ability to make significant improvements in function in a reasonable and predictable amount of time.  Equipment Recommendations  None recommended by OT    Recommendations for Other Services       Precautions / Restrictions Precautions Precautions: Fall Restrictions Weight Bearing  Restrictions: No      Mobility Bed Mobility Overal bed mobility: Modified Independent                  Transfers Overall transfer level: Needs assistance Equipment used: None Transfers: Sit to/from Stand Sit to Stand: Min guard           General transfer comment: 2x mild LOBs      Balance Overall balance assessment: Mild deficits observed, not formally tested                                         ADL either performed or assessed with clinical judgement   ADL Overall ADL's : Needs assistance/impaired Eating/Feeding: Independent;Sitting   Grooming: Supervision/safety;Standing   Upper Body Bathing: Set up;Sitting   Lower Body Bathing: Min guard;Sit to/from stand   Upper Body Dressing : Set up;Sitting   Lower Body Dressing: Min guard;Sit to/from stand   Toilet Transfer: Min guard;Ambulation   Toileting- Clothing Manipulation and Hygiene: Supervision/safety;Sitting/lateral lean       Functional mobility during ADLs: Min guard General ADL Comments: mild LOBs. cues needed to initiate and sustain tasks     Vision Baseline Vision/History: 0 No visual deficits Vision Assessment?: No apparent visual deficits     Perception Perception Perception Tested?: No   Praxis Praxis Praxis tested?: Not tested    Pertinent Vitals/Pain       Hand Dominance     Extremity/Trunk Assessment Upper Extremity Assessment Upper Extremity Assessment: Generalized weakness   Lower Extremity Assessment Lower Extremity Assessment: Defer to PT evaluation   Cervical / Trunk Assessment Cervical / Trunk Assessment: Normal  Communication Communication Communication: No difficulties   Cognition Arousal/Alertness: Awake/alert Behavior During Therapy: Flat affect Overall Cognitive Status: No family/caregiver present to determine baseline cognitive functioning Area of Impairment: Attention, Memory, Safety/judgement, Awareness, Problem solving,  Orientation                 Orientation Level: Disoriented to, Time Current Attention Level: Sustained Memory: Decreased short-term memory   Safety/Judgement: Decreased awareness of deficits Awareness: Emergent Problem Solving: Slow processing, Requires verbal cues General Comments: Oriented to person, place and situation. Had difficulty with day of the week. Pt is verbose, has poor attention, is unable to dual task and can be self-distracting     General Comments  VSS on RA, HR to 112    Exercises     Shoulder Instructions      Home Living Family/patient expects to be discharged to:: Private residence Living Arrangements: Alone Available Help at Discharge: Family;Available PRN/intermittently Type of Home: House Home Access: Stairs to enter [employer] of Steps: 5 (garage) Entrance Stairs-Rails: Right Home Layout: One level     Bathroom Shower/Tub: Producer, television/film/video: Standard     Home Equipment: None;Grab bars - toilet;Grab bars - tub/shower          Prior Functioning/Environment Prior Level of Function : Independent/Modified Independent             Mobility Comments: no AD, admists to shuffleing gait ADLs Comments: indep, drives, hx of double vision. retired [employer] fundraiser        OT Problem List: Decreased activity tolerance;Decreased safety awareness;Decreased knowledge of use of DME or AE;Decreased knowledge of precautions;Decreased cognition      OT Treatment/Interventions: Self-care/ADL training;Therapeutic exercise;DME and/or AE instruction;Therapeutic activities;Patient/family education;Balance training    OT Goals(Current goals can be found in the care plan section) Acute Rehab OT Goals Patient Stated Goal: home OT Goal Formulation: With patient Time For Goal Achievement: 09/21/22 Potential to Achieve Goals: Good ADL Goals Additional ADL Goal #1: Pt will independently complete IADLs Additional ADL Goal #2: Pt will indep  complete IADL medication management task  OT Frequency: Min 2X/week    Co-evaluation              AM-PAC OT "6 Clicks" Daily Activity     Outcome Measure Help from another person eating meals?: None Help from another person taking care of personal grooming?: A Little Help from another person toileting, which includes using toliet, bedpan, or urinal?: A Little Help from another person bathing (including washing, rinsing, drying)?: A Little Help from another person to put on and taking off regular upper body clothing?: None Help from another person to put on and taking off regular lower body clothing?: A Little 6 Click Score: 20   End of Session Nurse Communication: Mobility status  Activity Tolerance: Patient tolerated treatment well Patient left: in chair;with call bell/phone within reach  OT Visit Diagnosis: Unsteadiness on feet (R26.81);Other abnormalities of gait and mobility (R26.89);Muscle weakness (generalized) (M62.81)                Time: 1638-4536 OT Time Calculation (min): 23 min Charges:  OT General Charges $OT Visit: 1 Visit OT Evaluation $OT Eval Moderate Complexity: 1 Mod  Derenda Mis, OTR/L Acute Rehabilitation Services Office [phone] Secure Chat Communication Preferred   Donia Pounds 09/07/2022, 2:32 PM

## 2022-09-07 NOTE — Progress Notes (Signed)
ANTICOAGULATION CONSULT NOTE - Initial Consult  Pharmacy Consult for heparin Indication: chest pain/ACS  Allergies  Allergen Reactions   Amlodipine Swelling   Benicar Hct [Olmesartan Medoxomil-Hctz] Other (See Comments)    Edema of lower extremities    Codeine Other (See Comments)    Drops blood pressure   Diovan [Valsartan] Other (See Comments)    Palpitations.     Niacin And Related Other (See Comments)    Turns patient red, makes her pass out   Ropinirole Hcl Other (See Comments)    Patient Measurements: Height: 5\' 3"  (160 cm) Weight: 54.6 kg (120 lb 6.4 oz) IBW/kg (Calculated) : 52.4 Heparin Dosing Weight: 55.8 kg   Vital Signs: Temp: 97.9 F (36.6 C) (04/14 1210) Temp Source: Oral (04/14 1527) BP: 102/61 (04/14 1527) Pulse Rate: 109 (04/14 1527)  Labs: Recent Labs    09/06/22 1523 09/06/22 1736 09/06/22 1807 09/06/22 2007 09/06/22 2138 09/07/22 0102 09/07/22 0209 09/07/22 0537  HGB 12.7  --  12.9  --   --  12.1  --   --   HCT 37.5  --  38.0  --   --  34.8*  --   --   PLT 159  --   --   --   --  172  --   --   CREATININE 0.79  --   --   --   --  0.91  --   --   TROPONINIHS 31*   < >  --    < > 558*  --  867* 695*   < > = values in this interval not displayed.    Estimated Creatinine Clearance: 38.7 mL/min (by C-G formula based on SCr of 0.91 mg/dL).   Medical History: Past Medical History:  Diagnosis Date   Anxiety    Arthritis    Bundle branch block, left    Cancer    Complication of anesthesia    laryngeal spams 50 yrs ago   Depression    Dysphagia    Essential hypertension 06/17/2013   GERD (gastroesophageal reflux disease)    Hyperlipidemia    Kyphosis    PAC (premature atrial contraction) 06/17/2013   PAT (paroxysmal atrial tachycardia) 06/17/2013   Plantar fasciitis of right foot    Preop cardiovascular exam 10/16/2017   Primary localized osteoarthrosis of right shoulder 11/10/2017   Right shoulder pain 01/20/2012    Medications:   Scheduled:   aspirin EC  81 mg Oral Daily   furosemide  40 mg Oral Daily   LORazepam  0-4 mg Intravenous Q6H   Or   LORazepam  0-4 mg Oral Q6H   [START ON 09/09/2022] LORazepam  0-4 mg Intravenous Q12H   Or   [START ON 09/09/2022] LORazepam  0-4 mg Oral Q12H   metoprolol  200 mg Oral QPM   pantoprazole (PROTONIX) IV  40 mg Intravenous Q12H   rosuvastatin  20 mg Oral QPM   sodium chloride flush  3 mL Intravenous Q12H   thiamine  100 mg Oral Daily   Or   thiamine  100 mg Intravenous Daily    Assessment: 81 yof presenting with N/V for several weeks, did develop SOB and tachycardia after receiving IVF - no AC PTA.   Hgb 12.1, plt 172. Trop 19>379. No s/sx of bleeding. Last dose of SQH was on 4/14 at 1537.  Goal of Therapy:  Heparin level 0.3-0.7 units/ml Monitor platelets by anticoagulation protocol: Yes   Plan:  Give 3000 units bolus  x 1 Start heparin infusion at 650 units/hr Check anti-Xa level in 8 hours and daily while on heparin Continue to monitor H&H and platelets  Thank you for allowing pharmacy to participate in this patient's care,  Sherron Monday, PharmD, BCCCP Clinical Pharmacist  Phone: [phone] 09/07/2022 5:04 PM  Please check AMION for all [facility] phone numbers After 10:00 PM, call Main Pharmacy [phone]

## 2022-09-07 NOTE — Plan of Care (Signed)

## 2022-09-07 NOTE — Consult Note (Addendum)
Cardiology Consultation   Patient ID: Jasmine Swanson MRN: [id number]; DOB: February 12, 1941  Admit date: 09/06/2022 Date of Consult: 09/07/2022  PCP:  Gweneth Dimitri, MD    [facility] Providers Cardiologist:  Norman Herrlich, MD     Patient Profile:   Jasmine Swanson is a 82 y.o. female with a hx of HTN, HLD, LBBB on EKG, history of PAT, history of breast cancer, anxiety, depression who is being seen 09/07/2022 for the evaluation of elevated troponin at the request of Dr. Jacqulyn Bath.  History of Present Illness:   Jasmine Swanson is an 82 year old female with above medical history. Patient had multiple cardiac catheterizations in 2008 that revealed normal coronary arteries. Seen by Dr. Anne Fu in 2015 and had PAT that was treated with metoprolol. More recently, patient was seen by Dr. Dulce Sellar in 2019 for preoperative evaluation. At that time, patient was doing well from a cardiovascular standpoint and did not require further cardiac evaluation prior to surgery.   Patient presented to the ED at [facility] on 4/13 complaining of nausea and vomiting that had been ongoing for several weeks. Also has been having loose stools. Of note, patient's family reported that she had stopped drinking alcohol a few days ago, and she had been a fairly heavy drinker previously. She complained of anxiety and was having tremors in the ED. Initial vital signs in the ED showed BP 181/96, HR 112 BPM, O2 sat 93% on room air, and respirations 15 breaths per minute. Labs showed K 3.6, creatinine 0.79, WBC 6.4, hemoglobin 12.7, platelets 159. BNP elevated to 446.7. hsTn 31>248>476>558>867>695. Patient was given IV fluids in the ED, and she developed shortness of breath, tachycardia, and a wet sounding cough. Also became hypoxic to the 80s. CTA chest showed pulmonary edema with likely superimposed infection in the bilateral lower lobes, bilateral trace-small pleural effusions. Fluids were stopped, and patient was given IV lasix.  Also started on BiPAP, but was transitioned to [place] prior to admission to [facility]  While in the ED, patient had full body tremors and complained of feeling cold. Rectal temp 99.0 F.   Patient was admitted to the internal medicine service at [facility] for treatment of flash pulmonary edema. Cardiology consulted for elevated troponin.   On interview, patient denies having any past cardiac history besides hypertension. She does not take many medications, but does take a lot of supplements. For the past month or so, she has had nausea after eating. Reports that whenever she eats, she starts to feel nauseous and has a watery stool. Denies vomiting, abdominal pain. She started to have some abdominal distention earlier this week. She has lost about 10 lbs this month, and has been feeling increasingly weak. Since her stomach started bothering her, she has not been drinking alcohol. Notes that prior, she was drinking 1-2 glasses of wine nightly. She denies recent chest pain, palpitations. She did have mild shortness of breath earlier this week.   She came into the hospital because she felt very weak. After receiving IV fluids in the ED, she became very short of breath. Also had a wet cough and wheezing. Denies developing chest pain or palpitations. She was treated with lasix, and now feels much better. Denies shortness of breath, orthopnea, PND, ankle edema, abdominal distention. Has some dizziness when standing up. Did have some tremors overnight that improved with ativan.    Past Medical History:  Diagnosis Date   Anxiety    Arthritis    Bundle  branch block, left    Cancer    Complication of anesthesia    laryngeal spams 50 yrs ago   Depression    Dysphagia    Essential hypertension 06/17/2013   GERD (gastroesophageal reflux disease)    Hyperlipidemia    Kyphosis    PAC (premature atrial contraction) 06/17/2013   PAT (paroxysmal atrial tachycardia) 06/17/2013   Plantar fasciitis of right foot     Preop cardiovascular exam 10/16/2017   Primary localized osteoarthrosis of right shoulder 11/10/2017   Right shoulder pain 01/20/2012    Past Surgical History:  Procedure Laterality Date   ABDOMINAL HYSTERECTOMY     bladder tac     BREAST BIOPSY Right 06/25/2021   BREAST LUMPECTOMY Right 07/09/2021   BREAST LUMPECTOMY WITH RADIOACTIVE SEED LOCALIZATION Right 07/09/2021   Procedure: RIGHT BREAST LUMPECTOMY WITH RADIOACTIVE SEED LOCALIZATION;  Surgeon: Manus Rudd, MD;  Location: [facility];  Service: General;  Laterality: Right;   CARDIAC CATHETERIZATION     TONSILLECTOMY     TOTAL SHOULDER ARTHROPLASTY Right 11/10/2017   Procedure: TOTAL SHOULDER ARTHROPLASTY;  Surgeon: Teryl Lucy, MD;  Location: [facility] OR;  Service: Orthopedics;  Laterality: Right;     Home Medications:  Prior to Admission medications   Medication Sig Start Date End Date Taking? Authorizing Provider  acetaminophen (TYLENOL) 650 MG CR tablet Take 650 mg by mouth every 8 (eight) hours as needed for pain.    [provider]  Ascorbic Acid (VITAMIN C) 1000 MG tablet Take 1,000 mg by mouth daily.    [provider]  aspirin EC 81 MG tablet Take 81 mg by mouth every Monday, Wednesday, and Friday.    [provider]  Calcium Carb-Cholecalciferol (CALCIUM-VITAMIN D) 600-400 MG-UNIT TABS Take 1 tablet by mouth daily.    [provider]  Cholecalciferol 4000 units CAPS Take 4,000 Units by mouth daily.    [provider]  escitalopram (LEXAPRO) 10 MG tablet Take 10 mg by mouth every evening.  04/08/13   [provider]  fish oil-omega-3 fatty acids 1000 MG capsule Take 1 g by mouth daily.     [provider]  Flaxseed, Linseed, (FLAXSEED OIL) 1000 MG CAPS Take 1,000 mg by mouth daily.    [provider]  gabapentin (NEURONTIN) 100 MG capsule Take 1 capsule (100 mg total) by mouth 4 (four) times daily. Patient taking differently: Take  100 mg by mouth at bedtime. 12/27/19   Hyatt, Max T, DPM  LORazepam (ATIVAN) 0.5 MG tablet Take 0.5 mg by mouth every 8 (eight) hours.    [provider]  Magnesium 250 MG TABS Take 250 mg by mouth daily.     [provider]  metoprolol (TOPROL-XL) 200 MG 24 hr tablet Take 200 mg by mouth every evening.     [provider]  pyridOXINE (VITAMIN B-6) 100 MG tablet Take 100 mg by mouth daily.    [provider]  rosuvastatin (CRESTOR) 20 MG tablet Take 20 mg by mouth every evening.     [provider]    Inpatient Medications: Scheduled Meds:  aspirin EC  81 mg Oral Daily   furosemide  40 mg Oral Daily   heparin  5,000 Units Subcutaneous Q8H   LORazepam  0-4 mg Intravenous Q6H   Or   LORazepam  0-4 mg Oral Q6H   [START ON 09/09/2022] LORazepam  0-4 mg Intravenous Q12H   Or   [START ON 09/09/2022] LORazepam  0-4 mg  Oral Q12H   metoprolol  200 mg Oral QPM   rosuvastatin  20 mg Oral QPM   thiamine  100 mg Oral Daily   Or   thiamine  100 mg Intravenous Daily   Continuous Infusions:  PRN Meds: acetaminophen **OR** acetaminophen, melatonin, ondansetron **OR** ondansetron (ZOFRAN) IV  Allergies:    Allergies  Allergen Reactions   Benicar Hct [Olmesartan Medoxomil-Hctz]     Edema of lower extremities    Codeine Other (See Comments)    Drops blood pressure   Diovan [Valsartan] Other (See Comments)    Palpitations.     Niacin And Related Other (See Comments)    Turns patient red, makes her pass out    Social History:   Social History   Socioeconomic History   Marital status: Single    Spouse name: Not on file   Number of children: 1   Years of education: Not on file   Highest education level: Not on file  Occupational History   Not on file  Tobacco Use   Smoking status: Former    Packs/day: 0.50    Years: 15.00    Additional pack years: 0.00    Total pack years: 7.50    Types: Cigarettes   Smokeless tobacco: Never  Vaping Use    Vaping Use: Never used  Substance and Sexual Activity   Alcohol use: Yes    Comment: occ   Drug use: No   Sexual activity: Not Currently  Other Topics Concern   Not on file  Social History Narrative   Not on file   Social Determinants of Health   Financial Resource Strain: Not on file  Food Insecurity: No Food Insecurity (09/06/2022)   Hunger Vital Sign    Worried About Running Out of Food in the Last Year: Never true    Ran Out of Food in the Last Year: Never true  Transportation Needs: No Transportation Needs (09/06/2022)   PRAPARE - Administrator, Civil Service (Medical): No    Lack of Transportation (Non-Medical): No  Physical Activity: Not on file  Stress: Not on file  Social Connections: Not on file  Intimate Partner Violence: Not At Risk (09/06/2022)   Humiliation, Afraid, Rape, and Kick questionnaire    Fear of Current or Ex-Partner: No    Emotionally Abused: No    Physically Abused: No    Sexually Abused: No    Family History:    Family History  Problem Relation Age of Onset   Hyperlipidemia Mother    Hypertension Mother    Heart attack Mother    Colon cancer Father        dx. 68s   Prostate cancer Father        dx. 90s   Breast cancer Cousin        dx. 67s, paternal   Sudden death Neg Hx    Diabetes Neg Hx      ROS:  Please see the history of present illness.   All other ROS reviewed and negative.     Physical Exam/Data:   Vitals:   09/07/22 0450 09/07/22 0451 09/07/22 0600 09/07/22 0737  BP:    (!) 144/73  Pulse:   90 92  Resp:    17  Temp: (!) 97.5 F (36.4 C)   97.8 F (36.6 C)  TempSrc:    Oral  SpO2:    96%  Weight:  54.6 kg    Height:  Intake/Output Summary (Last 24 hours) at 09/07/2022 0752 Last data filed at 09/07/2022 0502 Gross per 24 hour  Intake 689.3 ml  Output 2050 ml  Net -1360.7 ml      09/07/2022    4:51 AM 09/06/2022    9:00 PM 09/06/2022    3:05 PM  Last 3 Weights  Weight (lbs) 120 lb 6.4 oz  123 lb 1.6 oz 123 lb  Weight (kg) 54.613 kg 55.838 kg 55.792 kg     Body mass index is 21.33 kg/m.  General:  Well nourished, well developed, in no acute distress. Laying in the bed with head elevated HEENT: normal Neck: no JVD with head elevated to 30 degrees  Vascular: Radial pulses 2+ bilaterally Cardiac:  normal S1, S2; RRR; no murmur Lungs:  crackles in bilateral lung bases. Normal WOB with 1L supplemental oxygen via [place]  Abd: soft. Tender to palpation in left lower quadrant  Ext: no edema in BLE  Musculoskeletal:  No deformities, BUE and BLE strength normal and equal Skin: warm and dry  Neuro:  CNs 2-12 intact, no focal abnormalities noted Psych:  Normal affect   EKG:  The EKG was personally reviewed and demonstrates:  Sinus tachycardia with HR 109 BPM, LBBB present  Telemetry:  Telemetry was personally reviewed and demonstrates:  Sinus rhythm, HR in the 90s-100s   Relevant CV Studies:   Laboratory Data:  High Sensitivity Troponin:   Recent Labs  Lab 09/06/22 1736 09/06/22 2007 09/06/22 2138 09/07/22 0209 09/07/22 0537  TROPONINIHS 248* 476* 558* 867* 695*     Chemistry Recent Labs  Lab 09/06/22 1523 09/06/22 1807 09/07/22 0102  NA 137 138 135  K 3.6 3.7 3.6  CL 105  --  102  CO2 20*  --  23  GLUCOSE 117*  --  122*  BUN 11  --  10  CREATININE 0.79  --  0.91  CALCIUM 8.5*  --  8.2*  MG  --   --  1.6*  GFRNONAA >60  --  >60  ANIONGAP 12  --  10    Recent Labs  Lab 09/06/22 1523 09/07/22 0102  PROT 7.1 6.3*  ALBUMIN 4.1 3.6  AST 30 30  ALT 21 21  ALKPHOS 45 40  BILITOT 1.0 1.1   Lipids No results for input(s): "CHOL", "TRIG", "HDL", "LABVLDL", "LDLCALC", "CHOLHDL" in the last 168 hours.  Hematology Recent Labs  Lab 09/06/22 1523 09/06/22 1807 09/07/22 0102  WBC 6.4  --  7.1  RBC 3.98  --  3.72*  HGB 12.7 12.9 12.1  HCT 37.5 38.0 34.8*  MCV 94.2  --  93.5  MCH 31.9  --  32.5  MCHC 33.9  --  34.8  RDW 12.1  --  12.4  PLT 159  --  172    Thyroid No results for input(s): "TSH", "FREET4" in the last 168 hours.  BNP Recent Labs  Lab 09/06/22 1523  BNP 446.7*    DDimer No results for input(s): "DDIMER" in the last 168 hours.   Radiology/Studies:  CT Angio Chest PE W and/or Wo Contrast  Result Date: 09/06/2022 CLINICAL DATA:  Pulmonary embolism (PE) suspected, high prob EXAM: CT ANGIOGRAPHY CHEST WITH CONTRAST TECHNIQUE: Multidetector CT imaging of the chest was performed using the standard protocol during bolus administration of intravenous contrast. Multiplanar CT image reconstructions and MIPs were obtained to evaluate the vascular anatomy. RADIATION DOSE REDUCTION: This exam was performed according to the departmental dose-optimization program which includes automated exposure  control, adjustment of the mA and/or kV according to patient size and/or use of iterative reconstruction technique. CONTRAST:  75mL OMNIPAQUE IOHEXOL 350 MG/ML SOLN COMPARISON:  None Available. FINDINGS: Cardiovascular: Satisfactory opacification of the pulmonary arteries to the segmental level. No evidence of pulmonary embolism. Normal heart size. No significant pericardial effusion. The thoracic aorta is normal in caliber. Severe atherosclerotic plaque of the thoracic aorta. No coronary artery calcifications. Mediastinum/Nodes: Upper limits of normal bilateral hilar lymph nodes likely reactive in etiology. No enlarged mediastinal or axillary lymph nodes. Thyroid gland, trachea, and esophagus demonstrate no significant findings. Lungs/Pleura: Diffuse interlobular septal wall thickening. Peribronchovascular ground-glass and consolidative airspace opacities most prominent within the lower lobes. No pulmonary nodule. No pulmonary mass. Bilateral trace to small volume pleural effusions. No pneumothorax. Upper Abdomen: No acute abnormality. Musculoskeletal: No chest wall abnormality. No suspicious lytic or blastic osseous lesions. No acute displaced fracture.  Multilevel degenerative changes of the spine. Total right shoulder arthroplasty. Review of the MIP images confirms the above findings. IMPRESSION: 1. Pulmonary edema with likely superimposed infection most prominent within the bilateral lower lobes. 2. Bilateral trace to small volume pleural effusions. 3. Upper limits of normal bilateral hilar lymph nodes likely reactive in etiology. 4.  Aortic Atherosclerosis (ICD10-I70.0). Electronically Signed   By: Tish Frederickson M.D.   On: 09/06/2022 18:22   DG Chest 1 View  Result Date: 09/06/2022 CLINICAL DATA:  HTN (hypertension) EXAM: CHEST  1 VIEW COMPARISON:  Chest x-ray November 10, 2017. FINDINGS: Small pleural effusion. Left greater than right bibasilar opacities. No visible pneumothorax. Cardiomediastinal silhouette is within normal limits. Right reverse shoulder arthroplasty. S-shaped thoracolumbar curvature. Polyarticular degenerative change. IMPRESSION: Small left pleural effusion. Left greater than right bibasilar opacities could represent atelectasis, aspiration, and/or pneumonia. Electronically Signed   By: Feliberto Harts M.D.   On: 09/06/2022 15:54     Assessment and Plan:   Elevated Troponin  Flash Pulmonary Edema  - Patient presented to the ED complaining of nausea and decreased oral intake for the past month. Had been having increased weakness and weight loss over the past month as well - After receiving IV fluids in the ED, she developed acute onset shortness of breath and a wet cough. Initially required Bi-PAP and became tachycardic with HR to the 120s  - hsTn 31>248>476>558>867>695  - Patient received IV lasix 60 mg and IV lasix 40 mg overnight. Output 2.05 L urine overnight  - Now on PO lasix 40 mg daily - Echocardiogram pending  - Patient denies chest pain. Did not have significant shortness of breath or chest pain prior to admission  - Suspect trop elevation is demand ischemia in the setting of flash pulmonary edema and tachycardia.  No need for IV heparin at this time. We will follow echo results and adjust plan accordingly   Tremors  Nausea  - Patient complained that she had full body tremors in the ED and again overnight. Symptoms improved with ativan  - In the ED, family expressed concern that patient drank alcohol heavily. Patient reports only drinking 1-2 glasses of wine per night  - On CIWA protocol per primary. She does have some symptoms of withdrawal (nausea, tremors, anxiety, tachycardia). - Management per primary  HTN  - Continue metoprolol succinate 200 mg daily   Risk Assessment/Risk Scores:   New York Heart Association ([name]) Functional Class [name] Class IV      For questions or updates, please contact [facility] Please consult [email or web address] for contact info under  Signed, Jonita Albee, PA-C  09/07/2022 7:52 AM  Patient seen and examined and agree with Robet Leu, PA-C as detailed above.  In brief, the patient is a 82 year old female with history of  HTN, HLD, LBBB on EKG, history of PAT, history of breast cancer, anxiety, depression who presented to the ER with nausea/vomiting with course complicated by acute respiratory distress after administration of IVF concerning for flash pulmonary edema as well as elevated troponin for which Cardiology was consulted.  Presented to [facility] with nausea/vomiting/loose stools and while receiving IVF in the ER, she developed acute respiratory distress requiring BiPAP with CTA chest revealing pulmonary edema. Notably, she was hypertensive at the time of arrival to 180s which may have been contributing. She received IV lasix in the ER with significant improvement. Trop in this setting 31>248>476>558>867>695. BNP 446. ECG with sinus tachycardia with LBBB (chronic). She was subsequently transferred to [facility].   Here, the patient feels much better with significant improvement in her breathing after IV lasix. Prior to this admission, she denies any  chest pain, SOB, orthopnea or PND. Notably, she is a heavy drinker per her family and had recently stopped drinking due to GI discomfort.   Overall, suspect her trop rise was due to demand in the setting of acute pulmonary edema. No current chest pain or SOB and ECG unchanged from prior. Will follow-up TTE to help guide management. If TTE reassuring, can consider coronary CTA as inpatient vs outpatient vs myoview to assess further.  GEN: No acute distress.   Neck: No JVD Cardiac: Tachycardic, regular, no murmurs Respiratory: Crackles at the bases GI: Soft, nontender, non-distended  MS: No edema; No deformity. Neuro:  Nonfocal  Psych: Normal affect    Plan: -Suspect trop elevation secondary to demand in the setting of acute pulmonary edema -CTA chest without significant coronary calcifications -Will follow-up TTE to further guide management going forward -If TTE reassuring, can consider coronary CTA vs myoview for further evaluation (patient would like to stay noninvasive if possible) -Continue SA 81mg  daily -Continue home metop 200mg  XL daily -Continue crestor 20mg  daily -On CIWA per primary  Laurance Flatten, MD

## 2022-09-07 NOTE — Progress Notes (Signed)
   Echocardiogram showed EF 30-35% with regional wall motion abnormalities concerning for LAD disease vs takostubo cardiomyopathy. Patient will need cardiac catheterization to rule out CAD. Discussed cardiac catheterization with patient and her daughter. We will start IV heparin.   Shared Decision Making/Informed Consent The risks [stroke (1 in 1000), death (1 in 1000), kidney failure [usually temporary] (1 in 500), bleeding (1 in 200), allergic reaction [possibly serious] (1 in 200)], benefits (diagnostic support and management of coronary artery disease) and alternatives of a cardiac catheterization were discussed in detail with Jasmine Swanson and she is willing to proceed.  Patient is added onto cath lab schedule for tomorrow. NPO at midnight.   Jonita Albee, PA-C 09/07/2022 4:48 PM

## 2022-09-07 NOTE — Progress Notes (Signed)
TTE reviewed. EF down to 30-35% with mid septal, all apical and apex hypokinesis. Possible LAD disease vs stress cardiomyopathy. Patient planned for cath tomorrow and was updated at bedside.  INFORMED CONSENT: I have reviewed the risks, indications, and alternatives to cardiac catheterization, possible angioplasty, and stenting with the patient. Risks include but are not limited to bleeding, infection, vascular injury, stroke, myocardial infection, arrhythmia, kidney injury, radiation-related injury in the case of prolonged fluoroscopy use, emergency cardiac surgery, and death. The patient understands the risks of serious complication is 1-2 in 1000 with diagnostic cardiac cath and 1-2% or less with angioplasty/stenting.    Laurance Flatten, MD

## 2022-09-07 NOTE — Progress Notes (Addendum)
PROGRESS NOTE    Jasmine Swanson  ULA:[id number] DOB: Aug 23, 1940 DOA: 09/06/2022 PCP: Gweneth Dimitri, MD   Brief Narrative:  HPI: 82 year old female history of hypertension, hyperlipidemia, prior history of breast cancer presents to the ER today with feeling shaky at home.  She states that she woke up and felt somewhat strange.  She had some abdominal discomfort and irritable bowel like syndrome symptoms.  She denies any diarrhea but just felt that her intestines were moving around a lot.  She denies any fever, chills.  She called her friend due to shaking at home.  Her friend brought her to the ER.   While in the ER and undergoing workup, she received some IV fluids.  She began feeling short of breath afterwards.  She became hypoxic.  She required BiPAP.  Chest x-ray showed pulmonary edema and she was started on IV Lasix.  She was weaned off BiPAP.  Patient does not have a history of CHF.   Her initial troponin was 31.  Increased to 248 and then up to 476.   EKG showed normal sinus rhythm with an IVCD.  This is different from her EKG from February 2023.  Patient denies any history of cardiac disease.  She states that she has had 2 cardiac catheterizations in the past but has not had any stents placed.   Patient transferred to [facility].   As far as her alcohol intake is concerned, the patient states that she has 1 glass of wine each day.  Does not drink any beer or liquor.  Patient states that she does not have a problem with alcohol.    ED Course: received 500 ml IVF and developed acute pulmonary edema. Started on bipap and given 60 mg IV lasix. CTPA negative for PE.  Assessment & Plan:   Principal Problem:   Acute respiratory failure with hypoxia Active Problems:   Acute pulmonary edema   Elevated troponin   Essential hypertension   Hyperlipidemia   DNR (do not resuscitate)  Acute hypoxic respiratory failure secondary to acute flash pulmonary edema Unclear the reason  for her flash pulmonary edema.  Cardiology consulted, patient on oral Lasix.  Echo pending.  Currently feeling better.  She is only on 1 L of oxygen and saturating 99%.  Elevated troponin: Per cardiology, this is likely demand ischemia.  Heparin is not recommended.  Echo pending.   Hyperlipidemia Continue Crestor.   Essential hypertension Blood pressure controlled.  Continue home dose of Toprol-XL.  Alcohol dependence/abuse: When discussed with the patient in front of her daughter at the bedside, patient clearly was trying to hide her heavy drinking.  She states she drinks barely 1 to 2 glasses of wine a day.  Her daughter verified right in front of her that she drinks all day long.  Her daughter said she drinks at least 3 to 4 glasses of wine a day.  The patient initially argued but then thanked his daughter to be honest while she could not.  She also thanked me in front of her daughter for being direct with her and pushing her and her daughter to be truthful.  Daughter also said that patient never goes without having any alcohol any day and this has been going on for many years.  Interestingly, after my rounds, the daughter pulled me on the side outside in the hallway to inform me actual alcohol intake by the patient.  She told me that patient drinks at least 2 bottles of wine  a day.  She also said that despite of thinking her and me in the room, the patient actually became very upset at the daughter for revealing that truth about her alcoholism.  Patient is not withdrawing now but she is at high risk.  We will continue CIWA protocol with as needed Ativan and multivitamin.  Nausea and weakness/ ?  Gastritis?  Esophagitis: Patient was asking why she was having nausea and vomiting and sometimes abdominal pain.  As mentioned above, finally we discovered that she is drinking heavily.  This raises concern for possible esophagitis and gastritis as the source of her symptoms.  I have started her on Protonix  40 mg IV twice daily.  If she does not improve, she may need endoscopy either inpatient or outpatient.  DVT prophylaxis: heparin injection 5,000 Units Start: 09/07/22 0100 SCDs Start: 09/07/22 0009   Code Status: DNR per attending hospitalist, patient is supposed to be DNR but order was placed as full code.  I verified with the daughter and she said that patient is supposed to be DNR.  Changing order. Family Communication: Daughter present at bedside.  Plan of care discussed with patient in length and he/she verbalized understanding and agreed with it.  Status is: Inpatient Remains inpatient appropriate because: Hypoxic, echo pending.  Will also consult PT OT.   Estimated body mass index is 21.33 kg/m as calculated from the following:   Height as of this encounter: 5\' 3"  (1.6 m).   Weight as of this encounter: 54.6 kg.    Nutritional Assessment: Body mass index is 21.33 kg/m.Marland Kitchen Seen by dietician.  I agree with the assessment and plan as outlined below: Nutrition Status:        . Skin Assessment: I have examined the patient's skin and I agree with the wound assessment as performed by the wound care RN as outlined below:    Consultants:  Cardiology  Procedures:  None  Antimicrobials:  Anti-infectives (From admission, onward)    Start     Dose/Rate Route Frequency Ordered Stop   09/06/22 1915  Ampicillin-Sulbactam (UNASYN) 3 g in sodium chloride 0.9 % 100 mL IVPB        3 g 200 mL/hr over 30 Minutes Intravenous  Once 09/06/22 1900 09/07/22 0734         Subjective: Patient seen and examined.  She complains of nausea and some epigastric abdominal pain.  No other complaint.  Objective: Vitals:   09/07/22 0451 09/07/22 0600 09/07/22 0737 09/07/22 1210  BP:   (!) 144/73 127/68  Pulse:  90 92 92  Resp:   17 17  Temp:   97.8 F (36.6 C) 97.9 F (36.6 C)  TempSrc:   Oral Oral  SpO2:   96% 99%  Weight: 54.6 kg     Height:        Intake/Output Summary (Last 24  hours) at 09/07/2022 1311 Last data filed at 09/07/2022 1102 Gross per 24 hour  Intake 689.3 ml  Output 2800 ml  Net -2110.7 ml   Filed Weights   09/06/22 1505 09/06/22 2100 09/07/22 0451  Weight: 55.8 kg 55.8 kg 54.6 kg    Examination:  General exam: Appears calm and comfortable  Respiratory system: Clear to auscultation. Respiratory effort normal. Cardiovascular system: S1 & S2 heard, RRR. No JVD, murmurs, rubs, gallops or clicks. No pedal edema. Gastrointestinal system: Abdomen is nondistended, soft and nontender. No organomegaly or masses felt. Normal bowel sounds heard. Central nervous system: Alert and oriented. No  focal neurological deficits. Extremities: Symmetric 5 x 5 power. Skin: No rashes, lesions or ulcers Psychiatry: Judgement and insight appear normal. Mood & affect appropriate.    Data Reviewed: I have personally reviewed following labs and imaging studies  CBC: Recent Labs  Lab 09/06/22 1523 09/06/22 1807 09/07/22 0102  WBC 6.4  --  7.1  NEUTROABS 4.6  --  5.6  HGB 12.7 12.9 12.1  HCT 37.5 38.0 34.8*  MCV 94.2  --  93.5  PLT 159  --  172   Basic Metabolic Panel: Recent Labs  Lab 09/06/22 1523 09/06/22 1807 09/07/22 0102  NA 137 138 135  K 3.6 3.7 3.6  CL 105  --  102  CO2 20*  --  23  GLUCOSE 117*  --  122*  BUN 11  --  10  CREATININE 0.79  --  0.91  CALCIUM 8.5*  --  8.2*  MG  --   --  1.6*   GFR: Estimated Creatinine Clearance: 38.7 mL/min (by C-G formula based on SCr of 0.91 mg/dL). Liver Function Tests: Recent Labs  Lab 09/06/22 1523 09/07/22 0102  AST 30 30  ALT 21 21  ALKPHOS 45 40  BILITOT 1.0 1.1  PROT 7.1 6.3*  ALBUMIN 4.1 3.6   Recent Labs  Lab 09/06/22 1523  LIPASE 24   No results for input(s): "AMMONIA" in the last 168 hours. Coagulation Profile: No results for input(s): "INR", "PROTIME" in the last 168 hours. Cardiac Enzymes: No results for input(s): "CKTOTAL", "CKMB", "CKMBINDEX", "TROPONINI" in the last 168  hours. BNP (last 3 results) No results for input(s): "PROBNP" in the last 8760 hours. HbA1C: No results for input(s): "HGBA1C" in the last 72 hours. CBG: Recent Labs  Lab 09/07/22 0145  GLUCAP 114*   Lipid Profile: No results for input(s): "CHOL", "HDL", "LDLCALC", "TRIG", "CHOLHDL", "LDLDIRECT" in the last 72 hours. Thyroid Function Tests: No results for input(s): "TSH", "T4TOTAL", "FREET4", "T3FREE", "THYROIDAB" in the last 72 hours. Anemia Panel: No results for input(s): "VITAMINB12", "FOLATE", "FERRITIN", "TIBC", "IRON", "RETICCTPCT" in the last 72 hours. Sepsis Labs: Recent Labs  Lab 09/07/22 0102  PROCALCITON <0.10    Recent Results (from the past 240 hour(s))  Resp panel by RT-PCR (RSV, Flu A&B, Covid) Anterior Nasal Swab     Status: None   Collection Time: 09/06/22  7:33 PM   Specimen: Anterior Nasal Swab  Result Value Ref Range Status   SARS Coronavirus 2 by RT PCR NEGATIVE NEGATIVE Final    Comment: (NOTE) SARS-CoV-2 target nucleic acids are NOT DETECTED.  The SARS-CoV-2 RNA is generally detectable in upper respiratory specimens during the acute phase of infection. The lowest concentration of SARS-CoV-2 viral copies this assay can detect is 138 copies/mL. A negative result does not preclude SARS-Cov-2 infection and should not be used as the sole basis for treatment or other patient management decisions. A negative result may occur with  improper specimen collection/handling, submission of specimen other than nasopharyngeal swab, presence of viral mutation(s) within the areas targeted by this assay, and inadequate number of viral copies(<138 copies/mL). A negative result must be combined with clinical observations, patient history, and epidemiological information. The expected result is Negative.  Fact Sheet for Patients:  BloggerCourse.com  Fact Sheet for Healthcare Providers:  SeriousBroker.it  This test  is no t yet approved or cleared by the [place] FDA and  has been authorized for detection and/or diagnosis of SARS-CoV-2 by FDA under an Emergency Use Authorization (EUA). This EUA  will remain  in effect (meaning this test can be used) for the duration of the COVID-19 declaration under Section 564(b)(1) of the Act, 21 U.S.C.section 360bbb-3(b)(1), unless the authorization is terminated  or revoked sooner.       Influenza A by PCR NEGATIVE NEGATIVE Final   Influenza B by PCR NEGATIVE NEGATIVE Final    Comment: (NOTE) The Xpert Xpress SARS-CoV-2/FLU/RSV plus assay is intended as an aid in the diagnosis of influenza from Nasopharyngeal swab specimens and should not be used as a sole basis for treatment. Nasal washings and aspirates are unacceptable for Xpert Xpress SARS-CoV-2/FLU/RSV testing.  Fact Sheet for Patients: BloggerCourse.com  Fact Sheet for Healthcare Providers: SeriousBroker.it  This test is not yet approved or cleared by the [place] FDA and has been authorized for detection and/or diagnosis of SARS-CoV-2 by FDA under an Emergency Use Authorization (EUA). This EUA will remain in effect (meaning this test can be used) for the duration of the COVID-19 declaration under Section 564(b)(1) of the Act, 21 U.S.C. section 360bbb-3(b)(1), unless the authorization is terminated or revoked.     Resp Syncytial Virus by PCR NEGATIVE NEGATIVE Final    Comment: (NOTE) Fact Sheet for Patients: BloggerCourse.com  Fact Sheet for Healthcare Providers: SeriousBroker.it  This test is not yet approved or cleared by the [place] FDA and has been authorized for detection and/or diagnosis of SARS-CoV-2 by FDA under an Emergency Use Authorization (EUA). This EUA will remain in effect (meaning this test can be used) for the duration of the COVID-19 declaration under Section  564(b)(1) of the Act, 21 U.S.C. section 360bbb-3(b)(1), unless the authorization is terminated or revoked.  Performed at [facility] [address] [place], [place] [address]      Radiology Studies: CT Angio Chest PE W and/or Wo Contrast  Result Date: 09/06/2022 CLINICAL DATA:  Pulmonary embolism (PE) suspected, high prob EXAM: CT ANGIOGRAPHY CHEST WITH CONTRAST TECHNIQUE: Multidetector CT imaging of the chest was performed using the standard protocol during bolus administration of intravenous contrast. Multiplanar CT image reconstructions and MIPs were obtained to evaluate the vascular anatomy. RADIATION DOSE REDUCTION: This exam was performed according to the departmental dose-optimization program which includes automated exposure control, adjustment of the mA and/or kV according to patient size and/or use of iterative reconstruction technique. CONTRAST:  70mL OMNIPAQUE IOHEXOL 350 MG/ML SOLN COMPARISON:  None Available. FINDINGS: Cardiovascular: Satisfactory opacification of the pulmonary arteries to the segmental level. No evidence of pulmonary embolism. Normal heart size. No significant pericardial effusion. The thoracic aorta is normal in caliber. Severe atherosclerotic plaque of the thoracic aorta. No coronary artery calcifications. Mediastinum/Nodes: Upper limits of normal bilateral hilar lymph nodes likely reactive in etiology. No enlarged mediastinal or axillary lymph nodes. Thyroid gland, trachea, and esophagus demonstrate no significant findings. Lungs/Pleura: Diffuse interlobular septal wall thickening. Peribronchovascular ground-glass and consolidative airspace opacities most prominent within the lower lobes. No pulmonary nodule. No pulmonary mass. Bilateral trace to small volume pleural effusions. No pneumothorax. Upper Abdomen: No acute abnormality. Musculoskeletal: No chest wall abnormality. No suspicious lytic or blastic osseous lesions. No acute displaced fracture.  Multilevel degenerative changes of the spine. Total right shoulder arthroplasty. Review of the MIP images confirms the above findings. IMPRESSION: 1. Pulmonary edema with likely superimposed infection most prominent within the bilateral lower lobes. 2. Bilateral trace to small volume pleural effusions. 3. Upper limits of normal bilateral hilar lymph nodes likely reactive in etiology. 4.  Aortic Atherosclerosis (ICD10-I70.0). Electronically Signed  By: Tish Frederickson M.D.   On: 09/06/2022 18:22   DG Chest 1 View  Result Date: 09/06/2022 CLINICAL DATA:  HTN (hypertension) EXAM: CHEST  1 VIEW COMPARISON:  Chest x-ray November 10, 2017. FINDINGS: Small pleural effusion. Left greater than right bibasilar opacities. No visible pneumothorax. Cardiomediastinal silhouette is within normal limits. Right reverse shoulder arthroplasty. S-shaped thoracolumbar curvature. Polyarticular degenerative change. IMPRESSION: Small left pleural effusion. Left greater than right bibasilar opacities could represent atelectasis, aspiration, and/or pneumonia. Electronically Signed   By: Feliberto Harts M.D.   On: 09/06/2022 15:54    Scheduled Meds:  aspirin EC  81 mg Oral Daily   furosemide  40 mg Oral Daily   heparin  5,000 Units Subcutaneous Q8H   LORazepam  0-4 mg Intravenous Q6H   Or   LORazepam  0-4 mg Oral Q6H   [START ON 09/09/2022] LORazepam  0-4 mg Intravenous Q12H   Or   [START ON 09/09/2022] LORazepam  0-4 mg Oral Q12H   metoprolol  200 mg Oral QPM   pantoprazole (PROTONIX) IV  40 mg Intravenous Q12H   rosuvastatin  20 mg Oral QPM   thiamine  100 mg Oral Daily   Or   thiamine  100 mg Intravenous Daily   Continuous Infusions:     LOS: 1 day   Hughie Closs, MD [facility]  09/07/2022, 1:11 PM   *Please note that this is a verbal dictation therefore any spelling or grammatical errors are due to the "Dragon Medical One" system interpretation.  Please page via Amion and do not message via secure  chat for urgent patient care matters. Secure chat can be used for non urgent patient care matters.  How to contact the [facility] Attending or Consulting provider 7A - 7P or covering provider during after hours 7P -7A, for this patient?  Check the care team in [facility] and look for a) attending/consulting TRH provider listed and b) the [facility] team listed. Page or secure chat 7A-7P. Log into [email or web address] and use [facility]'s universal password to access. If you do not have the password, please contact the hospital operator. Locate the [facility] provider you are looking for under [facility] and page to a number that you can be directly reached. If you still have difficulty reaching the provider, please page the [facility] (Director on Call) for the Hospitalists listed on amion for assistance.

## 2022-09-07 NOTE — Evaluation (Signed)
Physical Therapy Evaluation Patient Details Name: Jasmine Swanson MRN: [id number] DOB: 02-Feb-1941 Today's Date: 09/07/2022  History of Present Illness  82 year old female who presented 09/06/22 after feeling shaky and weak at home. Chest x-ray showed pulmonary edema, further work up revealed elevated troponin and alcohol dependence. PMHx: hypertension, hyperlipidemia, anxiety, depression, prior history of breast cancer, diplopia, etoh   Clinical Impression  Pt presents with condition above and deficits mentioned below, see PT Problem List. PTA, she was independent without AD, living alone in a 1-level house with 5 STE. Pt reports a shuffling gait pattern at baseline. Pt demonstrates deficits in gross strength, balance, activity tolerance, and cognition at this time. Pt had x2 minor LOB bouts upon initially standing and ambulating without UE support, but was able to recover on her own. As distance progressed with ambulation so did her balance, to the point she was dancing without LOB within the room. Min guard assist provided throughout for safety only. Pt will likely progress well, thus do not anticipate any follow-up PT needs at d/c, but will continue to follow acutely to maximize her return to baseline prior to d/c.     Recommendations for follow up therapy are one component of a multi-disciplinary discharge planning process, led by the attending physician.  Recommendations may be updated based on patient status, additional functional criteria and insurance authorization.  Follow Up Recommendations       Assistance Recommended at Discharge PRN  Patient can return home with the following  A little help with walking and/or transfers;A little help with bathing/dressing/bathroom;Assistance with cooking/housework;Assist for transportation;Help with stairs or ramp for entrance    Equipment Recommendations None recommended by PT  Recommendations for Other Services       Functional Status  Assessment Patient has had a recent decline in their functional status and demonstrates the ability to make significant improvements in function in a reasonable and predictable amount of time.     Precautions / Restrictions Precautions Precautions: Fall Restrictions Weight Bearing Restrictions: No      Mobility  Bed Mobility Overal bed mobility: Modified Independent             General bed mobility comments: HOB elevated, no assistance needed    Transfers Overall transfer level: Needs assistance Equipment used: None Transfers: Sit to/from Stand Sit to Stand: Min guard           General transfer comment: 2x mild LOBs with initially coming to stand, min guard for safety    Ambulation/Gait Ambulation/Gait assistance: Min guard, +2 safety/equipment Gait Distance (Feet): 65 Feet Assistive device: None Gait Pattern/deviations: Step-through pattern, Decreased stride length, Shuffle, Narrow base of support Gait velocity: reduced Gait velocity interpretation: <1.8 ft/sec, indicate of risk for recurrent falls   General Gait Details: Pt with initialy x2 mild LOB bouts, but pt quickly recovered with step reactional strategies. Pt progressed to ambulating more quickly and even dancing in room without LOB. Min guard for Wellsite geologist    Modified Rankin (Stroke Patients Only)       Balance Overall balance assessment: Mild deficits observed, not formally tested                                           Pertinent Vitals/Pain Pain Assessment Pain Assessment: Faces Faces Pain Scale: No hurt  Pain Intervention(s): Monitored during session    Home Living Family/patient expects to be discharged to:: Private residence Living Arrangements: Alone Available Help at Discharge: Family;Available PRN/intermittently Type of Home: House Home Access: Stairs to enter Entrance Stairs-Rails: Right Entrance Stairs-Number of  Steps: 5 (garage)   Home Layout: One level Home Equipment: Grab bars - toilet;Grab bars - tub/shower      Prior Function Prior Level of Function : Independent/Modified Independent             Mobility Comments: no AD, admits to shuffling gait pattern at baseline ADLs Comments: indep, drives, hx of double vision. retired Charity fundraiser from [employer]        Extremity/Trunk Assessment   Upper Extremity Assessment Upper Extremity Assessment: Defer to OT evaluation    Lower Extremity Assessment Lower Extremity Assessment: Generalized weakness    Cervical / Trunk Assessment Cervical / Trunk Assessment: Normal  Communication   Communication: No difficulties  Cognition Arousal/Alertness: Awake/alert Behavior During Therapy: Flat affect Overall Cognitive Status: No family/caregiver present to determine baseline cognitive functioning Area of Impairment: Attention, Memory, Safety/judgement, Awareness, Problem solving, Orientation                 Orientation Level: Disoriented to, Time Current Attention Level: Sustained Memory: Decreased short-term memory   Safety/Judgement: Decreased awareness of deficits Awareness: Emergent Problem Solving: Slow processing, Requires verbal cues General Comments: Oriented to person, place and situation. Had difficulty with day of the week. Pt is verbose, has poor attention, is unable to dual task and can be self-distracting        General Comments General comments (skin integrity, edema, etc.): VSS on RA    Exercises     Assessment/Plan    PT Assessment Patient needs continued PT services  PT Problem List Decreased strength;Decreased activity tolerance;Decreased balance;Decreased mobility;Decreased cognition       PT Treatment Interventions DME instruction;Gait training;Stair training;Functional mobility training;Therapeutic activities;Therapeutic exercise;Balance training;Neuromuscular re-education;Cognitive  remediation;Patient/family education    PT Goals (Current goals can be found in the Care Plan section)  Acute Rehab PT Goals Patient Stated Goal: to feel better PT Goal Formulation: With patient Time For Goal Achievement: 09/21/22 Potential to Achieve Goals: Good    Frequency Min 1X/week     Co-evaluation PT/OT/SLP Co-Evaluation/Treatment: Yes Reason for Co-Treatment: Other (comment) (imminent for OT, assumed likely need ASAP for PT then also) PT goals addressed during session: Mobility/safety with mobility;Balance OT goals addressed during session: ADL's and self-care       AM-PAC PT "6 Clicks" Mobility  Outcome Measure Help needed turning from your back to your side while in a flat bed without using bedrails?: None Help needed moving from lying on your back to sitting on the side of a flat bed without using bedrails?: None Help needed moving to and from a bed to a chair (including a wheelchair)?: A Little Help needed standing up from a chair using your arms (e.g., wheelchair or bedside chair)?: A Little Help needed to walk in hospital room?: A Little Help needed climbing 3-5 steps with a railing? : A Little 6 Click Score: 20    End of Session Equipment Utilized During Treatment: Gait belt Activity Tolerance: Patient tolerated treatment well Patient left: in chair;with call bell/phone within reach   PT Visit Diagnosis: Unsteadiness on feet (R26.81);Other abnormalities of gait and mobility (R26.89);Muscle weakness (generalized) (M62.81)    Time: 1219-7588 PT Time Calculation (min) (ACUTE ONLY): 19 min   Charges:  PT Evaluation $PT Eval Low Complexity: 1 Low          Raymond Gurney, PT, DPT Acute Rehabilitation Services  Office: [phone]   Jewel Baize 09/07/2022, 4:05 PM

## 2022-09-07 NOTE — Progress Notes (Signed)
Echocardiogram 2D Echocardiogram has been performed.  Lucendia Herrlich 09/07/2022, 1:36 PM

## 2022-09-08 ENCOUNTER — Encounter (HOSPITAL_COMMUNITY)
Admission: EM | Disposition: A | Discharge status: Home Health | Payer: Self-pay | Source: Home / Self Care | Attending: Family Medicine

## 2022-09-08 ENCOUNTER — Encounter (HOSPITAL_COMMUNITY): Payer: Self-pay | Admitting: Internal Medicine

## 2022-09-08 ENCOUNTER — Other Ambulatory Visit (HOSPITAL_COMMUNITY): Payer: Self-pay

## 2022-09-08 DIAGNOSIS — I214 Non-ST elevation (NSTEMI) myocardial infarction: Secondary | ICD-10-CM

## 2022-09-08 DIAGNOSIS — I5021 Acute systolic (congestive) heart failure: Secondary | ICD-10-CM

## 2022-09-08 DIAGNOSIS — I428 Other cardiomyopathies: Secondary | ICD-10-CM

## 2022-09-08 DIAGNOSIS — E876 Hypokalemia: Secondary | ICD-10-CM

## 2022-09-08 DIAGNOSIS — J9601 Acute respiratory failure with hypoxia: Secondary | ICD-10-CM | POA: Diagnosis not present

## 2022-09-08 DIAGNOSIS — I420 Dilated cardiomyopathy: Secondary | ICD-10-CM

## 2022-09-08 HISTORY — PX: LEFT HEART CATH AND CORONARY ANGIOGRAPHY: CATH118249

## 2022-09-08 HISTORY — DX: Hypokalemia: E87.6

## 2022-09-08 HISTORY — DX: Acute systolic (congestive) heart failure: I50.21

## 2022-09-08 HISTORY — DX: Non-ST elevation (NSTEMI) myocardial infarction: I21.4

## 2022-09-08 HISTORY — DX: Dilated cardiomyopathy: I42.0

## 2022-09-08 LAB — CBC WITH DIFFERENTIAL/PLATELET
Abs Immature Granulocytes: 0.02 10*3/uL (ref 0.00–0.07)
Basophils Absolute: 0 10*3/uL (ref 0.0–0.1)
Basophils Relative: 0 %
Eosinophils Absolute: 0.1 10*3/uL (ref 0.0–0.5)
Eosinophils Relative: 2 %
HCT: 33.9 % — ABNORMAL LOW (ref 36.0–46.0)
Hemoglobin: 11.7 g/dL — ABNORMAL LOW (ref 12.0–15.0)
Immature Granulocytes: 0 %
Lymphocytes Relative: 31 %
Lymphs Abs: 1.9 10*3/uL (ref 0.7–4.0)
MCH: 32.1 pg (ref 26.0–34.0)
MCHC: 34.5 g/dL (ref 30.0–36.0)
MCV: 93.1 fL (ref 80.0–100.0)
Monocytes Absolute: 0.6 10*3/uL (ref 0.1–1.0)
Monocytes Relative: 9 %
Neutro Abs: 3.7 10*3/uL (ref 1.7–7.7)
Neutrophils Relative %: 58 %
Platelets: 160 10*3/uL (ref 150–400)
RBC: 3.64 MIL/uL — ABNORMAL LOW (ref 3.87–5.11)
RDW: 12.5 % (ref 11.5–15.5)
WBC: 6.4 10*3/uL (ref 4.0–10.5)
nRBC: 0 % (ref 0.0–0.2)

## 2022-09-08 LAB — BASIC METABOLIC PANEL
Anion gap: 10 (ref 5–15)
BUN: 13 mg/dL (ref 8–23)
CO2: 26 mmol/L (ref 22–32)
Calcium: 8.6 mg/dL — ABNORMAL LOW (ref 8.9–10.3)
Chloride: 98 mmol/L (ref 98–111)
Creatinine, Ser: 0.87 mg/dL (ref 0.44–1.00)
GFR, Estimated: >60 mL/min (ref >60)
Glucose, Bld: 131 mg/dL — ABNORMAL HIGH (ref 70–99)
Potassium: 3.3 mmol/L — ABNORMAL LOW (ref 3.5–5.1)
Sodium: 134 mmol/L — ABNORMAL LOW (ref 135–145)

## 2022-09-08 LAB — HEPARIN LEVEL (UNFRACTIONATED)
Heparin Unfractionated: 0.94 IU/mL — ABNORMAL HIGH (ref 0.30–0.70)
Heparin Unfractionated: 0.6 IU/mL (ref 0.30–0.70)

## 2022-09-08 LAB — MAGNESIUM: Magnesium: 2.2 mg/dL (ref 1.7–2.4)

## 2022-09-08 SURGERY — LEFT HEART CATH AND CORONARY ANGIOGRAPHY
Anesthesia: LOCAL

## 2022-09-08 MED ORDER — THIAMINE HCL 100 MG/ML IJ SOLN: 100.0000 mg | Freq: Every day | INTRAMUSCULAR | Status: DC

## 2022-09-08 MED ORDER — FENTANYL CITRATE (PF) 100 MCG/2ML IJ SOLN
INTRAMUSCULAR | Status: AC
  Filled 2022-09-08: qty 2

## 2022-09-08 MED ORDER — SODIUM CHLORIDE 0.9% FLUSH: 3.0000 mL | INTRAVENOUS | Status: DC | PRN

## 2022-09-08 MED ORDER — FOLIC ACID 1 MG PO TABS
1.0000 mg | ORAL_TABLET | Freq: Every day | ORAL | Status: DC
  Administered 2022-09-08 – 2022-09-09 (×2): 1 mg via ORAL
  Filled 2022-09-08 (×2): qty 1

## 2022-09-08 MED ORDER — SODIUM CHLORIDE 0.9% FLUSH
3.0000 mL | Freq: Two times a day (BID) | INTRAVENOUS | Status: DC
  Administered 2022-09-08 – 2022-09-09 (×2): 3 mL via INTRAVENOUS

## 2022-09-08 MED ORDER — VERAPAMIL HCL 2.5 MG/ML IV SOLN
INTRAVENOUS | Status: DC | PRN
  Administered 2022-09-08 (×2): 10 mL via INTRA_ARTERIAL

## 2022-09-08 MED ORDER — VERAPAMIL HCL 2.5 MG/ML IV SOLN
INTRAVENOUS | Status: AC
  Filled 2022-09-08: qty 2

## 2022-09-08 MED ORDER — POTASSIUM CHLORIDE CRYS ER 20 MEQ PO TBCR
40.0000 meq | EXTENDED_RELEASE_TABLET | Freq: Once | ORAL | Status: AC
  Administered 2022-09-08: 40 meq via ORAL
  Filled 2022-09-08: qty 2

## 2022-09-08 MED ORDER — HEPARIN (PORCINE) IN NACL 1000-0.9 UT/500ML-% IV SOLN
INTRAVENOUS | Status: DC | PRN
  Administered 2022-09-08 (×2): 500 mL

## 2022-09-08 MED ORDER — LIDOCAINE HCL (PF) 1 % IJ SOLN
INTRAMUSCULAR | Status: DC | PRN
  Administered 2022-09-08: 2 mL

## 2022-09-08 MED ORDER — HEPARIN SODIUM (PORCINE) 1000 UNIT/ML IJ SOLN
INTRAMUSCULAR | Status: DC | PRN
  Administered 2022-09-08: 3000 [IU] via INTRAVENOUS

## 2022-09-08 MED ORDER — ADULT MULTIVITAMIN W/MINERALS CH
1.0000 | ORAL_TABLET | Freq: Every day | ORAL | Status: DC
  Administered 2022-09-08 – 2022-09-09 (×2): 1 via ORAL
  Filled 2022-09-08 (×2): qty 1

## 2022-09-08 MED ORDER — HEPARIN SODIUM (PORCINE) 1000 UNIT/ML IJ SOLN
INTRAMUSCULAR | Status: AC
  Filled 2022-09-08: qty 10

## 2022-09-08 MED ORDER — LORAZEPAM 1 MG PO TABS: 1.0000 mg | ORAL_TABLET | Freq: Two times a day (BID) | ORAL | Status: DC | PRN

## 2022-09-08 MED ORDER — ENOXAPARIN SODIUM 40 MG/0.4ML IJ SOSY
40.0000 mg | PREFILLED_SYRINGE | INTRAMUSCULAR | Status: DC
  Administered 2022-09-09: 40 mg via SUBCUTANEOUS
  Filled 2022-09-08: qty 0.4

## 2022-09-08 MED ORDER — MIDAZOLAM HCL 2 MG/2ML IJ SOLN
INTRAMUSCULAR | Status: DC | PRN
  Administered 2022-09-08: 1 mg via INTRAVENOUS

## 2022-09-08 MED ORDER — LORAZEPAM 1 MG PO TABS: 1.0000 mg | ORAL_TABLET | ORAL | Status: DC | PRN

## 2022-09-08 MED ORDER — FENTANYL CITRATE (PF) 100 MCG/2ML IJ SOLN
INTRAMUSCULAR | Status: DC | PRN
  Administered 2022-09-08: 25 ug via INTRAVENOUS

## 2022-09-08 MED ORDER — HYDRALAZINE HCL 20 MG/ML IJ SOLN: 10.0000 mg | INTRAMUSCULAR | Status: AC | PRN

## 2022-09-08 MED ORDER — IOHEXOL 350 MG/ML SOLN
INTRAVENOUS | Status: DC | PRN
  Administered 2022-09-08: 20 mL

## 2022-09-08 MED ORDER — MIDAZOLAM HCL 2 MG/2ML IJ SOLN
INTRAMUSCULAR | Status: AC
  Filled 2022-09-08: qty 2

## 2022-09-08 MED ORDER — THIAMINE MONONITRATE 100 MG PO TABS
100.0000 mg | ORAL_TABLET | Freq: Every day | ORAL | Status: DC
  Administered 2022-09-08 – 2022-09-09 (×2): 100 mg via ORAL
  Filled 2022-09-08: qty 1

## 2022-09-08 MED ORDER — SODIUM CHLORIDE 0.9 % IV SOLN: 250.0000 mL | INTRAVENOUS | Status: DC | PRN

## 2022-09-08 MED ORDER — DAPAGLIFLOZIN PROPANEDIOL 10 MG PO TABS
10.0000 mg | ORAL_TABLET | Freq: Every day | ORAL | Status: DC
  Administered 2022-09-08 – 2022-09-09 (×2): 10 mg via ORAL
  Filled 2022-09-08 (×2): qty 1

## 2022-09-08 MED ORDER — LIDOCAINE HCL (PF) 1 % IJ SOLN
INTRAMUSCULAR | Status: AC
  Filled 2022-09-08: qty 30

## 2022-09-08 SURGICAL SUPPLY — 11 items
CATH 5FR JL3.5 JR4 ANG PIG MP (CATHETERS) IMPLANT
DEVICE RAD TR BAND REGULAR (VASCULAR PRODUCTS) IMPLANT
ELECT DEFIB PAD ADLT CADENCE (PAD) IMPLANT
GLIDESHEATH SLEND SS 6F .021 (SHEATH) IMPLANT
GUIDEWIRE INQWIRE 1.5J.035X260 (WIRE) IMPLANT
INQWIRE 1.5J .035X260CM (WIRE) ×1
KIT HEART LEFT (KITS) ×2 IMPLANT
PACK CARDIAC CATHETERIZATION (CUSTOM PROCEDURE TRAY) ×2 IMPLANT
SHEATH PROBE COVER 6X72 (BAG) IMPLANT
TRANSDUCER W/STOPCOCK (MISCELLANEOUS) ×2 IMPLANT
TUBING CIL FLEX 10 FLL-RA (TUBING) ×2 IMPLANT

## 2022-09-08 NOTE — Progress Notes (Addendum)
Progress Note  Patient Name: Jasmine Swanson Date of Encounter: 09/08/2022  [facility] Cardiologist: Norman Herrlich, MD   Patient Profile  Cardiac Studies & Procedures       ECHOCARDIOGRAM  ECHOCARDIOGRAM COMPLETE 09/07/2022  Narrative ECHOCARDIOGRAM REPORT    Patient Name:   Jasmine Swanson [facility] Date of Exam: 09/07/2022 Medical Rec #:  [id number]         Height:       63.0 in Accession #:    [id number]        Weight:       120.4 lb Date of Birth:  11-Sep-1940         BSA:          1.558 m Patient Age:    82 years          BP:           144/73 mmHg Patient Gender: F                 HR:           90 bpm. Exam Location:  Inpatient  Procedure: 2D Echo, Cardiac Doppler, Color Doppler and Strain Analysis  Indications:    Acute pulmonary edema  History:        Patient has no prior history of Echocardiogram examinations. Arrythmias:Tachycardia; Risk Factors:Hypertension and Dyslipidemia. Breast Cancer of R breast.  Sonographer:    Lucendia Herrlich Referring Phys: [id number] ERIC CHEN  IMPRESSIONS   1. Left ventricular ejection fraction, by estimation, is 30 to 35%. The left ventricle has moderately decreased function. The left ventricle demonstrates regional wall motion abnormalities (see scoring diagram/findings for description). The mid-to-apical inferoseptal, mid-to-apical anteroseptal, mid-to-apical inferior, apical anterior, apical lateral and apex are severely hypokinetic. Findings concerning for possible LAD disease vs takostubo cardiomyopathy. There is mild concentric left ventricular hypertrophy. Indeterminate diastolic filling due to E-A fusion. 2. Right ventricular systolic function is normal. The right ventricular size is normal. 3. Left atrial size was mildly dilated. 4. The mitral valve is grossly normal. Trivial mitral valve regurgitation. No evidence of mitral stenosis. 5. The aortic valve is tricuspid. There is mild calcification of the aortic valve. There is  mild thickening of the aortic valve. Aortic valve regurgitation is not visualized. Aortic valve sclerosis/calcification is present, without any evidence of aortic stenosis. 6. The inferior vena cava is dilated in size with >50% respiratory variability, suggesting right atrial pressure of 8 mmHg.  FINDINGS Left Ventricle: The mid-to-apical inferoseptum, mid-to-apical anteroseptal, mid-to-apical inferior, apical anterior, apical lateral and the LV apex are severely hypokinetic. Left ventricular ejection fraction, by estimation, is 30 to 35%. The left ventricle has moderately decreased function. The left ventricle demonstrates regional wall motion abnormalities. Global longitudinal strain performed but not reported based on interpreter judgement due to suboptimal tracking. The left ventricular internal cavity size was normal in size. There is mild concentric left ventricular hypertrophy. Indeterminate diastolic filling due to E-A fusion.  Right Ventricle: The right ventricular size is normal. No increase in right ventricular wall thickness. Right ventricular systolic function is normal.  Left Atrium: Left atrial size was mildly dilated.  Right Atrium: Right atrial size was normal in size.  Pericardium: There is no evidence of pericardial effusion.  Mitral Valve: The mitral valve is grossly normal. Trivial mitral valve regurgitation. No evidence of mitral valve stenosis.  Tricuspid Valve: The tricuspid valve is normal in structure. Tricuspid valve regurgitation is not demonstrated.  Aortic Valve: The aortic valve is tricuspid.  There is mild calcification of the aortic valve. There is mild thickening of the aortic valve. Aortic valve regurgitation is not visualized. Aortic valve sclerosis/calcification is present, without any evidence of aortic stenosis. Aortic valve peak gradient measures 6.4 mmHg.  Pulmonic Valve: The pulmonic valve was normal in structure. Pulmonic valve regurgitation is not  visualized.  Aorta: The aortic root and ascending aorta are structurally normal, with no evidence of dilitation.  Venous: The inferior vena cava is dilated in size with greater than 50% respiratory variability, suggesting right atrial pressure of 8 mmHg.  IAS/Shunts: The atrial septum is grossly normal.   LEFT VENTRICLE PLAX 2D LVIDd:         4.30 cm     Diastology LVIDs:         3.50 cm     LV e' medial:    8.38 cm/s LV PW:         1.20 cm     LV E/e' medial:  13.4 LV IVS:        1.20 cm     LV e' lateral:   7.89 cm/s LVOT diam:     1.90 cm     LV E/e' lateral: 14.2 LV SV:         43 LV SV Index:   28 LVOT Area:     2.84 cm  LV Volumes (MOD) LV vol d, MOD A2C: 73.8 ml LV vol d, MOD A4C: 65.7 ml LV vol s, MOD A2C: 43.6 ml LV vol s, MOD A4C: 42.2 ml LV SV MOD A2C:     30.2 ml LV SV MOD A4C:     65.7 ml LV SV MOD BP:      26.0 ml  RIGHT VENTRICLE            IVC RV S prime:     7.77 cm/s  IVC diam: 2.00 cm TAPSE (M-mode): 1.4 cm  LEFT ATRIUM             Index        RIGHT ATRIUM          Index LA diam:        2.90 cm 1.86 cm/m   RA Area:     9.51 cm LA Vol (A2C):   57.5 ml 36.89 ml/m  RA Volume:   18.20 ml 11.68 ml/m LA Vol (A4C):   48.9 ml 31.38 ml/m LA Biplane Vol: 54.9 ml 35.23 ml/m AORTIC VALVE AV Area (Vmax): 2.00 cm AV Vmax:        126.00 cm/s AV Peak Grad:   6.4 mmHg LVOT Vmax:      88.97 cm/s LVOT Vmean:     56.267 cm/s LVOT VTI:       0.151 m  AORTA Ao Root diam: 2.90 cm Ao Asc diam:  2.70 cm  MITRAL VALVE MV Area (PHT): 5.09 cm     SHUNTS MV Decel Time: 149 msec     Systemic VTI:  0.15 m MV E velocity: 112.00 cm/s  Systemic Diam: 1.90 cm  Laurance Flatten MD Electronically signed by Laurance Flatten MD Signature Date/Time: 09/07/2022/3:00:05 PM    Final             Subjective   Denies any chest pain or pressure.  No shortness of breath.  She is n.p.o. for cardiac cath today to rule out LAD disease versus stress cardiomyopathy.  EF on  echo 30 to 35%.  Inpatient Medications    Scheduled Meds:  aspirin EC  81 mg Oral Daily   dapagliflozin propanediol  10 mg Oral Daily   folic acid  1 mg Oral Daily   metoprolol  200 mg Oral QPM   multivitamin with minerals  1 tablet Oral Daily   pantoprazole (PROTONIX) IV  40 mg Intravenous Q12H   rosuvastatin  20 mg Oral QPM   sodium chloride flush  3 mL Intravenous Q12H   thiamine  100 mg Oral Daily   Or   thiamine  100 mg Intravenous Daily   Continuous Infusions:  sodium chloride     sodium chloride 10 mL/hr at 09/08/22 0621   heparin 550 Units/hr (09/08/22 0434)   PRN Meds: sodium chloride, acetaminophen **OR** acetaminophen, LORazepam **OR** LORazepam, melatonin, ondansetron **OR** ondansetron (ZOFRAN) IV, sodium chloride flush   Vital Signs    Vitals:   09/07/22 2100 09/07/22 2350 09/08/22 0403 09/08/22 0807  BP: 122/62 132/81 131/81 (!) 158/80  Pulse: 78 78 65 69  Resp:  18 18 18   Temp:  98.3 F (36.8 C) 97.6 F (36.4 C) 97.8 F (36.6 C)  TempSrc:  Oral Oral Oral  SpO2:  97% 97% 98%  Weight:   54.5 kg   Height:        Intake/Output Summary (Last 24 hours) at 09/08/2022 0934 Last data filed at 09/07/2022 2300 Gross per 24 hour  Intake 402.92 ml  Output 900 ml  Net -497.08 ml      09/08/2022    4:03 AM 09/07/2022    4:51 AM 09/06/2022    9:00 PM  Last 3 Weights  Weight (lbs) 120 lb 2.4 oz 120 lb 6.4 oz 123 lb 1.6 oz  Weight (kg) 54.5 kg 54.613 kg 55.838 kg      Telemetry    Normal sinus rhythm- Personally Reviewed  ECG    No EKG to review- Personally Reviewed  Physical Exam   GEN: No acute distress.   Neck: No JVD Cardiac: RRR, no murmurs, rubs, or gallops.  Respiratory: Clear to auscultation bilaterally. GI: Soft, nontender, non-distended  MS: No edema; No deformity. Neuro:  Nonfocal  Psych: Normal affect   Labs    High Sensitivity Troponin:   Recent Labs  Lab 09/06/22 1736 09/06/22 2007 09/06/22 2138 09/07/22 0209 09/07/22 0537   TROPONINIHS 248* 476* 558* 867* 695*      Chemistry Recent Labs  Lab 09/06/22 1523 09/06/22 1807 09/07/22 0102 09/08/22 0246  NA 137 138 135 134*  K 3.6 3.7 3.6 3.3*  CL 105  --  102 98  CO2 20*  --  23 26  GLUCOSE 117*  --  122* 131*  BUN 11  --  10 13  CREATININE 0.79  --  0.91 0.87  CALCIUM 8.5*  --  8.2* 8.6*  PROT 7.1  --  6.3*  --   ALBUMIN 4.1  --  3.6  --   AST 30  --  30  --   ALT 21  --  21  --   ALKPHOS 45  --  40  --   BILITOT 1.0  --  1.1  --   GFRNONAA >60  --  >60 >60  ANIONGAP 12  --  10 10     Hematology Recent Labs  Lab 09/06/22 1523 09/06/22 1807 09/07/22 0102 09/08/22 0246  WBC 6.4  --  7.1 6.4  RBC 3.98  --  3.72* 3.64*  HGB 12.7 12.9 12.1 11.7*  HCT 37.5 38.0 34.8* 33.9*  MCV  94.2  --  93.5 93.1  MCH 31.9  --  32.5 32.1  MCHC 33.9  --  34.8 34.5  RDW 12.1  --  12.4 12.5  PLT 159  --  172 160    BNP Recent Labs  Lab 09/06/22 1523  BNP 446.7*     DDimer No results for input(s): "DDIMER" in the last 168 hours.    Radiology    ECHOCARDIOGRAM COMPLETE  Result Date: 09/07/2022    ECHOCARDIOGRAM REPORT   Patient Name:   Jasmine Swanson [facility] Date of Exam: 09/07/2022 Medical Rec #:  [id number]         Height:       63.0 in Accession #:    [id number]        Weight:       120.4 lb Date of Birth:  1941-03-03         BSA:          1.558 m Patient Age:    82 years          BP:           144/73 mmHg Patient Gender: F                 HR:           90 bpm. Exam Location:  Inpatient Procedure: 2D Echo, Cardiac Doppler, Color Doppler and Strain Analysis Indications:    Acute pulmonary edema  History:        Patient has no prior history of Echocardiogram examinations.                 Arrythmias:Tachycardia; Risk Factors:Hypertension and                 Dyslipidemia. Breast Cancer of R breast.  Sonographer:    Lucendia Herrlich Referring Phys: [id number] ERIC CHEN IMPRESSIONS  1. Left ventricular ejection fraction, by estimation, is 30 to 35%. The left ventricle  has moderately decreased function. The left ventricle demonstrates regional wall motion abnormalities (see scoring diagram/findings for description). The mid-to-apical inferoseptal, mid-to-apical anteroseptal, mid-to-apical inferior, apical anterior, apical lateral and apex are severely hypokinetic. Findings concerning for possible LAD disease vs takostubo cardiomyopathy. There is mild concentric left ventricular hypertrophy. Indeterminate diastolic filling due to E-A fusion.  2. Right ventricular systolic function is normal. The right ventricular size is normal.  3. Left atrial size was mildly dilated.  4. The mitral valve is grossly normal. Trivial mitral valve regurgitation. No evidence of mitral stenosis.  5. The aortic valve is tricuspid. There is mild calcification of the aortic valve. There is mild thickening of the aortic valve. Aortic valve regurgitation is not visualized. Aortic valve sclerosis/calcification is present, without any evidence of aortic stenosis.  6. The inferior vena cava is dilated in size with >50% respiratory variability, suggesting right atrial pressure of 8 mmHg. FINDINGS  Left Ventricle: The mid-to-apical inferoseptum, mid-to-apical anteroseptal, mid-to-apical inferior, apical anterior, apical lateral and the LV apex are severely hypokinetic. Left ventricular ejection fraction, by estimation, is 30 to 35%. The left ventricle has moderately decreased function. The left ventricle demonstrates regional wall motion abnormalities. Global longitudinal strain performed but not reported based on interpreter judgement due to suboptimal tracking. The left ventricular internal cavity size was normal in size. There is mild concentric left ventricular hypertrophy. Indeterminate diastolic filling due to E-A fusion. Right Ventricle: The right ventricular size is normal. No increase in right ventricular wall thickness. Right ventricular systolic function is normal. Left  Atrium: Left atrial size was  mildly dilated. Right Atrium: Right atrial size was normal in size. Pericardium: There is no evidence of pericardial effusion. Mitral Valve: The mitral valve is grossly normal. Trivial mitral valve regurgitation. No evidence of mitral valve stenosis. Tricuspid Valve: The tricuspid valve is normal in structure. Tricuspid valve regurgitation is not demonstrated. Aortic Valve: The aortic valve is tricuspid. There is mild calcification of the aortic valve. There is mild thickening of the aortic valve. Aortic valve regurgitation is not visualized. Aortic valve sclerosis/calcification is present, without any evidence of aortic stenosis. Aortic valve peak gradient measures 6.4 mmHg. Pulmonic Valve: The pulmonic valve was normal in structure. Pulmonic valve regurgitation is not visualized. Aorta: The aortic root and ascending aorta are structurally normal, with no evidence of dilitation. Venous: The inferior vena cava is dilated in size with greater than 50% respiratory variability, suggesting right atrial pressure of 8 mmHg. IAS/Shunts: The atrial septum is grossly normal.  LEFT VENTRICLE PLAX 2D LVIDd:         4.30 cm     Diastology LVIDs:         3.50 cm     LV e' medial:    8.38 cm/s LV PW:         1.20 cm     LV E/e' medial:  13.4 LV IVS:        1.20 cm     LV e' lateral:   7.89 cm/s LVOT diam:     1.90 cm     LV E/e' lateral: 14.2 LV SV:         43 LV SV Index:   28 LVOT Area:     2.84 cm  LV Volumes (MOD) LV vol d, MOD A2C: 73.8 ml LV vol d, MOD A4C: 65.7 ml LV vol s, MOD A2C: 43.6 ml LV vol s, MOD A4C: 42.2 ml LV SV MOD A2C:     30.2 ml LV SV MOD A4C:     65.7 ml LV SV MOD BP:      26.0 ml RIGHT VENTRICLE            IVC RV S prime:     7.77 cm/s  IVC diam: 2.00 cm TAPSE (M-mode): 1.4 cm LEFT ATRIUM             Index        RIGHT ATRIUM          Index LA diam:        2.90 cm 1.86 cm/m   RA Area:     9.51 cm LA Vol (A2C):   57.5 ml 36.89 ml/m  RA Volume:   18.20 ml 11.68 ml/m LA Vol (A4C):   48.9 ml 31.38 ml/m  LA Biplane Vol: 54.9 ml 35.23 ml/m  AORTIC VALVE AV Area (Vmax): 2.00 cm AV Vmax:        126.00 cm/s AV Peak Grad:   6.4 mmHg LVOT Vmax:      88.97 cm/s LVOT Vmean:     56.267 cm/s LVOT VTI:       0.151 m  AORTA Ao Root diam: 2.90 cm Ao Asc diam:  2.70 cm MITRAL VALVE MV Area (PHT): 5.09 cm     SHUNTS MV Decel Time: 149 msec     Systemic VTI:  0.15 m MV E velocity: 112.00 cm/s  Systemic Diam: 1.90 cm Laurance Flatten MD Electronically signed by Laurance Flatten MD Signature Date/Time: 09/07/2022/3:00:05 PM    Final  CT Angio Chest PE W and/or Wo Contrast  Result Date: 09/06/2022 CLINICAL DATA:  Pulmonary embolism (PE) suspected, high prob EXAM: CT ANGIOGRAPHY CHEST WITH CONTRAST TECHNIQUE: Multidetector CT imaging of the chest was performed using the standard protocol during bolus administration of intravenous contrast. Multiplanar CT image reconstructions and MIPs were obtained to evaluate the vascular anatomy. RADIATION DOSE REDUCTION: This exam was performed according to the departmental dose-optimization program which includes automated exposure control, adjustment of the mA and/or kV according to patient size and/or use of iterative reconstruction technique. CONTRAST:  75mL OMNIPAQUE IOHEXOL 350 MG/ML SOLN COMPARISON:  None Available. FINDINGS: Cardiovascular: Satisfactory opacification of the pulmonary arteries to the segmental level. No evidence of pulmonary embolism. Normal heart size. No significant pericardial effusion. The thoracic aorta is normal in caliber. Severe atherosclerotic plaque of the thoracic aorta. No coronary artery calcifications. Mediastinum/Nodes: Upper limits of normal bilateral hilar lymph nodes likely reactive in etiology. No enlarged mediastinal or axillary lymph nodes. Thyroid gland, trachea, and esophagus demonstrate no significant findings. Lungs/Pleura: Diffuse interlobular septal wall thickening. Peribronchovascular ground-glass and consolidative airspace opacities most  prominent within the lower lobes. No pulmonary nodule. No pulmonary mass. Bilateral trace to small volume pleural effusions. No pneumothorax. Upper Abdomen: No acute abnormality. Musculoskeletal: No chest wall abnormality. No suspicious lytic or blastic osseous lesions. No acute displaced fracture. Multilevel degenerative changes of the spine. Total right shoulder arthroplasty. Review of the MIP images confirms the above findings. IMPRESSION: 1. Pulmonary edema with likely superimposed infection most prominent within the bilateral lower lobes. 2. Bilateral trace to small volume pleural effusions. 3. Upper limits of normal bilateral hilar lymph nodes likely reactive in etiology. 4.  Aortic Atherosclerosis (ICD10-I70.0). Electronically Signed   By: Tish Frederickson M.D.   On: 09/06/2022 18:22   DG Chest 1 View  Result Date: 09/06/2022 CLINICAL DATA:  HTN (hypertension) EXAM: CHEST  1 VIEW COMPARISON:  Chest x-ray November 10, 2017. FINDINGS: Small pleural effusion. Left greater than right bibasilar opacities. No visible pneumothorax. Cardiomediastinal silhouette is within normal limits. Right reverse shoulder arthroplasty. S-shaped thoracolumbar curvature. Polyarticular degenerative change. IMPRESSION: Small left pleural effusion. Left greater than right bibasilar opacities could represent atelectasis, aspiration, and/or pneumonia. Electronically Signed   By: Feliberto Harts M.D.   On: 09/06/2022 15:54    Patient Profile     82 y.o. female a hx of HTN, HLD, LBBB on EKG, history of PAT, history of breast cancer, anxiety, depression who is being seen 09/07/2022 for the evaluation of elevated troponin at the request of Dr. Jacqulyn Bath.   Assessment & Plan    Elevated Troponin  Flash Pulmonary Edema  DCM - Patient presented to the ED complaining of nausea and decreased oral intake for the past month. Had been having increased weakness and weight loss over the past month as well - After receiving IV fluids in the  ED, she developed acute onset shortness of breath and a wet cough. Initially required Bi-PAP and became tachycardic with HR to the 120s  - hsTn 31>248>476>558>867>695  - Patient received IV lasix 120 mg of IV Lasix initially good urine output - Now on PO lasix 40 mg daily - She put out 1.45 L yesterday and is net -2.1 L since admission - Echo showed moderate LV dysfunction with EF 30 to 35% with mid to apical inferior/inferior septal/anterior septal, apical anterior and lateral hypokinesis and hypokinesis of the apex. - Did not have significant shortness of breath or chest pain prior to admission  -  Suspicious that she may have had a stress-induced MI/cardiomyopathy- - is n.p.o. for left heart catheterization today to define coronary anatomy - Continue aspirin 81 mg daily, IV heparin drip, Crestor 20 mg daily and beta-blocker - Got a diffuse body rash on Benicar HCT as well as significant lower extremity edema and felt terrible on valsartan so we will hold off for now on starting ARB or Entresto apparently got lower extremity edema with Benicar HCT and palpitations with Diovan. - Start Farxiga 10 mg daily - Ultimately will benefit from addition of spironolactone which likely will add at discharge   Tremors  Nausea  - Patient complained that she had full body tremors in the ED  Symptoms improved with ativan  - In the ED, family expressed concern that patient drank alcohol heavily. Patient reports only drinking 1-2 glasses of wine per night  - On CIWA protocol per primary. She does have some symptoms of withdrawal (nausea, tremors, anxiety, tachycardia). - Management per primary   HTN  -BP is controlled on exam  -Continue Toprol-XL 200 mg daily    Hypokalemia -Replete today -Repeat be met in a.m. -Mag was low yesterday at 1.6 was given 2 g IV yesterday -Repeat mag level today  I have spent a total of 30 minutes with patient reviewing 2D echo , telemetry, EKGs, labs and examining patient  as well as establishing an assessment and plan that was discussed with the patient.  > 50% of time was spent in direct patient care.       For questions or updates, please contact [facility] Please consult [email or web address] for contact info under        Signed, Armanda Magic, MD  09/08/2022, 9:34 AM

## 2022-09-08 NOTE — Interval H&P Note (Signed)
History and Physical Interval Note:  09/08/2022 2:24 PM  Jasmine Swanson  has presented today for surgery, with the diagnosis of NSTEMI.  The various methods of treatment have been discussed with the patient and family. After consideration of risks, benefits and other options for treatment, the patient has consented to  Procedure(s): LEFT HEART CATH AND CORONARY ANGIOGRAPHY (N/A) as a surgical intervention.  The patient's history has been reviewed, patient examined, no change in status, stable for surgery.  I have reviewed the patient's chart and labs.  Questions were answered to the patient's satisfaction.  The patient has agreed to rescind her DNR order for the duration of the cardiac catheterization; full code order has been placed.  She will return to her previous code status upon departure from the cath lab.  Cath Lab Visit (complete for each Cath Lab visit)  Clinical Evaluation Leading to the Procedure:   ACS: Yes.    Non-ACS:  N/A  Macai Sisneros

## 2022-09-08 NOTE — H&P (View-Only) (Signed)
  Progress Note  Patient Name: Jasmine Swanson Date of Encounter: 09/08/2022  [facility] Cardiologist: Brian Munley, MD   Patient Profile  Cardiac Studies & Procedures       ECHOCARDIOGRAM  ECHOCARDIOGRAM COMPLETE 09/07/2022  Narrative ECHOCARDIOGRAM REPORT    Patient Name:   Jasmine Swanson Date of Exam: 09/07/2022 Medical Rec #:  [id number]         Height:       63.0 in Accession #:    [id number]        Weight:       120.4 lb Date of Birth:  05/20/1941         BSA:          1.558 m Patient Age:    81 years          BP:           144/73 mmHg Patient Gender: F                 HR:           90 bpm. Exam Location:  Inpatient  Procedure: 2D Echo, Cardiac Doppler, Color Doppler and Strain Analysis  Indications:    Acute pulmonary edema  History:        Patient has no prior history of Echocardiogram examinations. Arrythmias:Tachycardia; Risk Factors:Hypertension and Dyslipidemia. Breast Cancer of R breast.  Sonographer:    Shanika Turnbull Referring Phys: [id number] ERIC CHEN  IMPRESSIONS   1. Left ventricular ejection fraction, by estimation, is 30 to 35%. The left ventricle has moderately decreased function. The left ventricle demonstrates regional wall motion abnormalities (see scoring diagram/findings for description). The mid-to-apical inferoseptal, mid-to-apical anteroseptal, mid-to-apical inferior, apical anterior, apical lateral and apex are severely hypokinetic. Findings concerning for possible LAD disease vs takostubo cardiomyopathy. There is mild concentric left ventricular hypertrophy. Indeterminate diastolic filling due to E-A fusion. 2. Right ventricular systolic function is normal. The right ventricular size is normal. 3. Left atrial size was mildly dilated. 4. The mitral valve is grossly normal. Trivial mitral valve regurgitation. No evidence of mitral stenosis. 5. The aortic valve is tricuspid. There is mild calcification of the aortic valve. There is  mild thickening of the aortic valve. Aortic valve regurgitation is not visualized. Aortic valve sclerosis/calcification is present, without any evidence of aortic stenosis. 6. The inferior vena cava is dilated in size with >50% respiratory variability, suggesting right atrial pressure of 8 mmHg.  FINDINGS Left Ventricle: The mid-to-apical inferoseptum, mid-to-apical anteroseptal, mid-to-apical inferior, apical anterior, apical lateral and the LV apex are severely hypokinetic. Left ventricular ejection fraction, by estimation, is 30 to 35%. The left ventricle has moderately decreased function. The left ventricle demonstrates regional wall motion abnormalities. Global longitudinal strain performed but not reported based on interpreter judgement due to suboptimal tracking. The left ventricular internal cavity size was normal in size. There is mild concentric left ventricular hypertrophy. Indeterminate diastolic filling due to E-A fusion.  Right Ventricle: The right ventricular size is normal. No increase in right ventricular wall thickness. Right ventricular systolic function is normal.  Left Atrium: Left atrial size was mildly dilated.  Right Atrium: Right atrial size was normal in size.  Pericardium: There is no evidence of pericardial effusion.  Mitral Valve: The mitral valve is grossly normal. Trivial mitral valve regurgitation. No evidence of mitral valve stenosis.  Tricuspid Valve: The tricuspid valve is normal in structure. Tricuspid valve regurgitation is not demonstrated.  Aortic Valve: The aortic valve is tricuspid.   There is mild calcification of the aortic valve. There is mild thickening of the aortic valve. Aortic valve regurgitation is not visualized. Aortic valve sclerosis/calcification is present, without any evidence of aortic stenosis. Aortic valve peak gradient measures 6.4 mmHg.  Pulmonic Valve: The pulmonic valve was normal in structure. Pulmonic valve regurgitation is not  visualized.  Aorta: The aortic root and ascending aorta are structurally normal, with no evidence of dilitation.  Venous: The inferior vena cava is dilated in size with greater than 50% respiratory variability, suggesting right atrial pressure of 8 mmHg.  IAS/Shunts: The atrial septum is grossly normal.   LEFT VENTRICLE PLAX 2D LVIDd:         4.30 cm     Diastology LVIDs:         3.50 cm     LV e' medial:    8.38 cm/s LV PW:         1.20 cm     LV E/e' medial:  13.4 LV IVS:        1.20 cm     LV e' lateral:   7.89 cm/s LVOT diam:     1.90 cm     LV E/e' lateral: 14.2 LV SV:         43 LV SV Index:   28 LVOT Area:     2.84 cm  LV Volumes (MOD) LV vol d, MOD A2C: 73.8 ml LV vol d, MOD A4C: 65.7 ml LV vol s, MOD A2C: 43.6 ml LV vol s, MOD A4C: 42.2 ml LV SV MOD A2C:     30.2 ml LV SV MOD A4C:     65.7 ml LV SV MOD BP:      26.0 ml  RIGHT VENTRICLE            IVC RV S prime:     7.77 cm/s  IVC diam: 2.00 cm TAPSE (M-mode): 1.4 cm  LEFT ATRIUM             Index        RIGHT ATRIUM          Index LA diam:        2.90 cm 1.86 cm/m   RA Area:     9.51 cm LA Vol (A2C):   57.5 ml 36.89 ml/m  RA Volume:   18.20 ml 11.68 ml/m LA Vol (A4C):   48.9 ml 31.38 ml/m LA Biplane Vol: 54.9 ml 35.23 ml/m AORTIC VALVE AV Area (Vmax): 2.00 cm AV Vmax:        126.00 cm/s AV Peak Grad:   6.4 mmHg LVOT Vmax:      88.97 cm/s LVOT Vmean:     56.267 cm/s LVOT VTI:       0.151 m  AORTA Ao Root diam: 2.90 cm Ao Asc diam:  2.70 cm  MITRAL VALVE MV Area (PHT): 5.09 cm     SHUNTS MV Decel Time: 149 msec     Systemic VTI:  0.15 m MV E velocity: 112.00 cm/s  Systemic Diam: 1.90 cm  Heather Pemberton MD Electronically signed by Heather Pemberton MD Signature Date/Time: 09/07/2022/3:00:05 PM    Final             Subjective   Denies any chest pain or pressure.  No shortness of breath.  She is n.p.o. for cardiac cath today to rule out LAD disease versus stress cardiomyopathy.  EF on  echo 30 to 35%.  Inpatient Medications    Scheduled Meds:    aspirin EC  81 mg Oral Daily   dapagliflozin propanediol  10 mg Oral Daily   folic acid  1 mg Oral Daily   metoprolol  200 mg Oral QPM   multivitamin with minerals  1 tablet Oral Daily   pantoprazole (PROTONIX) IV  40 mg Intravenous Q12H   rosuvastatin  20 mg Oral QPM   sodium chloride flush  3 mL Intravenous Q12H   thiamine  100 mg Oral Daily   Or   thiamine  100 mg Intravenous Daily   Continuous Infusions:  sodium chloride     sodium chloride 10 mL/hr at 09/08/22 0621   heparin 550 Units/hr (09/08/22 0434)   PRN Meds: sodium chloride, acetaminophen **OR** acetaminophen, LORazepam **OR** LORazepam, melatonin, ondansetron **OR** ondansetron (ZOFRAN) IV, sodium chloride flush   Vital Signs    Vitals:   09/07/22 2100 09/07/22 2350 09/08/22 0403 09/08/22 0807  BP: 122/62 132/81 131/81 (!) 158/80  Pulse: 78 78 65 69  Resp:  18 18 18  Temp:  98.3 F (36.8 C) 97.6 F (36.4 C) 97.8 F (36.6 C)  TempSrc:  Oral Oral Oral  SpO2:  97% 97% 98%  Weight:   54.5 kg   Height:        Intake/Output Summary (Last 24 hours) at 09/08/2022 0934 Last data filed at 09/07/2022 2300 Gross per 24 hour  Intake 402.92 ml  Output 900 ml  Net -497.08 ml      09/08/2022    4:03 AM 09/07/2022    4:51 AM 09/06/2022    9:00 PM  Last 3 Weights  Weight (lbs) 120 lb 2.4 oz 120 lb 6.4 oz 123 lb 1.6 oz  Weight (kg) 54.5 kg 54.613 kg 55.838 kg      Telemetry    Normal sinus rhythm- Personally Reviewed  ECG    No EKG to review- Personally Reviewed  Physical Exam   GEN: No acute distress.   Neck: No JVD Cardiac: RRR, no murmurs, rubs, or gallops.  Respiratory: Clear to auscultation bilaterally. GI: Soft, nontender, non-distended  MS: No edema; No deformity. Neuro:  Nonfocal  Psych: Normal affect   Labs    High Sensitivity Troponin:   Recent Labs  Lab 09/06/22 1736 09/06/22 2007 09/06/22 2138 09/07/22 0209 09/07/22 0537   TROPONINIHS 248* 476* 558* 867* 695*      Chemistry Recent Labs  Lab 09/06/22 1523 09/06/22 1807 09/07/22 0102 09/08/22 0246  NA 137 138 135 134*  K 3.6 3.7 3.6 3.3*  CL 105  --  102 98  CO2 20*  --  23 26  GLUCOSE 117*  --  122* 131*  BUN 11  --  10 13  CREATININE 0.79  --  0.91 0.87  CALCIUM 8.5*  --  8.2* 8.6*  PROT 7.1  --  6.3*  --   ALBUMIN 4.1  --  3.6  --   AST 30  --  30  --   ALT 21  --  21  --   ALKPHOS 45  --  40  --   BILITOT 1.0  --  1.1  --   GFRNONAA >60  --  >60 >60  ANIONGAP 12  --  10 10     Hematology Recent Labs  Lab 09/06/22 1523 09/06/22 1807 09/07/22 0102 09/08/22 0246  WBC 6.4  --  7.1 6.4  RBC 3.98  --  3.72* 3.64*  HGB 12.7 12.9 12.1 11.7*  HCT 37.5 38.0 34.8* 33.9*  MCV   94.2  --  93.5 93.1  MCH 31.9  --  32.5 32.1  MCHC 33.9  --  34.8 34.5  RDW 12.1  --  12.4 12.5  PLT 159  --  172 160    BNP Recent Labs  Lab 09/06/22 1523  BNP 446.7*     DDimer No results for input(s): "DDIMER" in the last 168 hours.    Radiology    ECHOCARDIOGRAM COMPLETE  Result Date: 09/07/2022    ECHOCARDIOGRAM REPORT   Patient Name:   Jasmine Swanson Date of Exam: 09/07/2022 Medical Rec #:  [id number]         Height:       63.0 in Accession #:    [id number]        Weight:       120.4 lb Date of Birth:  01/19/1941         BSA:          1.558 m Patient Age:    81 years          BP:           144/73 mmHg Patient Gender: F                 HR:           90 bpm. Exam Location:  Inpatient Procedure: 2D Echo, Cardiac Doppler, Color Doppler and Strain Analysis Indications:    Acute pulmonary edema  History:        Patient has no prior history of Echocardiogram examinations.                 Arrythmias:Tachycardia; Risk Factors:Hypertension and                 Dyslipidemia. Breast Cancer of R breast.  Sonographer:    Shanika Turnbull Referring Phys: [id number] ERIC CHEN IMPRESSIONS  1. Left ventricular ejection fraction, by estimation, is 30 to 35%. The left ventricle  has moderately decreased function. The left ventricle demonstrates regional wall motion abnormalities (see scoring diagram/findings for description). The mid-to-apical inferoseptal, mid-to-apical anteroseptal, mid-to-apical inferior, apical anterior, apical lateral and apex are severely hypokinetic. Findings concerning for possible LAD disease vs takostubo cardiomyopathy. There is mild concentric left ventricular hypertrophy. Indeterminate diastolic filling due to E-A fusion.  2. Right ventricular systolic function is normal. The right ventricular size is normal.  3. Left atrial size was mildly dilated.  4. The mitral valve is grossly normal. Trivial mitral valve regurgitation. No evidence of mitral stenosis.  5. The aortic valve is tricuspid. There is mild calcification of the aortic valve. There is mild thickening of the aortic valve. Aortic valve regurgitation is not visualized. Aortic valve sclerosis/calcification is present, without any evidence of aortic stenosis.  6. The inferior vena cava is dilated in size with >50% respiratory variability, suggesting right atrial pressure of 8 mmHg. FINDINGS  Left Ventricle: The mid-to-apical inferoseptum, mid-to-apical anteroseptal, mid-to-apical inferior, apical anterior, apical lateral and the LV apex are severely hypokinetic. Left ventricular ejection fraction, by estimation, is 30 to 35%. The left ventricle has moderately decreased function. The left ventricle demonstrates regional wall motion abnormalities. Global longitudinal strain performed but not reported based on interpreter judgement due to suboptimal tracking. The left ventricular internal cavity size was normal in size. There is mild concentric left ventricular hypertrophy. Indeterminate diastolic filling due to E-A fusion. Right Ventricle: The right ventricular size is normal. No increase in right ventricular wall thickness. Right ventricular systolic function is normal. Left   Atrium: Left atrial size was  mildly dilated. Right Atrium: Right atrial size was normal in size. Pericardium: There is no evidence of pericardial effusion. Mitral Valve: The mitral valve is grossly normal. Trivial mitral valve regurgitation. No evidence of mitral valve stenosis. Tricuspid Valve: The tricuspid valve is normal in structure. Tricuspid valve regurgitation is not demonstrated. Aortic Valve: The aortic valve is tricuspid. There is mild calcification of the aortic valve. There is mild thickening of the aortic valve. Aortic valve regurgitation is not visualized. Aortic valve sclerosis/calcification is present, without any evidence of aortic stenosis. Aortic valve peak gradient measures 6.4 mmHg. Pulmonic Valve: The pulmonic valve was normal in structure. Pulmonic valve regurgitation is not visualized. Aorta: The aortic root and ascending aorta are structurally normal, with no evidence of dilitation. Venous: The inferior vena cava is dilated in size with greater than 50% respiratory variability, suggesting right atrial pressure of 8 mmHg. IAS/Shunts: The atrial septum is grossly normal.  LEFT VENTRICLE PLAX 2D LVIDd:         4.30 cm     Diastology LVIDs:         3.50 cm     LV e' medial:    8.38 cm/s LV PW:         1.20 cm     LV E/e' medial:  13.4 LV IVS:        1.20 cm     LV e' lateral:   7.89 cm/s LVOT diam:     1.90 cm     LV E/e' lateral: 14.2 LV SV:         43 LV SV Index:   28 LVOT Area:     2.84 cm  LV Volumes (MOD) LV vol d, MOD A2C: 73.8 ml LV vol d, MOD A4C: 65.7 ml LV vol s, MOD A2C: 43.6 ml LV vol s, MOD A4C: 42.2 ml LV SV MOD A2C:     30.2 ml LV SV MOD A4C:     65.7 ml LV SV MOD BP:      26.0 ml RIGHT VENTRICLE            IVC RV S prime:     7.77 cm/s  IVC diam: 2.00 cm TAPSE (M-mode): 1.4 cm LEFT ATRIUM             Index        RIGHT ATRIUM          Index LA diam:        2.90 cm 1.86 cm/m   RA Area:     9.51 cm LA Vol (A2C):   57.5 ml 36.89 ml/m  RA Volume:   18.20 ml 11.68 ml/m LA Vol (A4C):   48.9 ml 31.38 ml/m  LA Biplane Vol: 54.9 ml 35.23 ml/m  AORTIC VALVE AV Area (Vmax): 2.00 cm AV Vmax:        126.00 cm/s AV Peak Grad:   6.4 mmHg LVOT Vmax:      88.97 cm/s LVOT Vmean:     56.267 cm/s LVOT VTI:       0.151 m  AORTA Ao Root diam: 2.90 cm Ao Asc diam:  2.70 cm MITRAL VALVE MV Area (PHT): 5.09 cm     SHUNTS MV Decel Time: 149 msec     Systemic VTI:  0.15 m MV E velocity: 112.00 cm/s  Systemic Diam: 1.90 cm Heather Pemberton MD Electronically signed by Heather Pemberton MD Signature Date/Time: 09/07/2022/3:00:05 PM    Final      CT Angio Chest PE W and/or Wo Contrast  Result Date: 09/06/2022 CLINICAL DATA:  Pulmonary embolism (PE) suspected, high prob EXAM: CT ANGIOGRAPHY CHEST WITH CONTRAST TECHNIQUE: Multidetector CT imaging of the chest was performed using the standard protocol during bolus administration of intravenous contrast. Multiplanar CT image reconstructions and MIPs were obtained to evaluate the vascular anatomy. RADIATION DOSE REDUCTION: This exam was performed according to the departmental dose-optimization program which includes automated exposure control, adjustment of the mA and/or kV according to patient size and/or use of iterative reconstruction technique. CONTRAST:  75mL OMNIPAQUE IOHEXOL 350 MG/ML SOLN COMPARISON:  None Available. FINDINGS: Cardiovascular: Satisfactory opacification of the pulmonary arteries to the segmental level. No evidence of pulmonary embolism. Normal heart size. No significant pericardial effusion. The thoracic aorta is normal in caliber. Severe atherosclerotic plaque of the thoracic aorta. No coronary artery calcifications. Mediastinum/Nodes: Upper limits of normal bilateral hilar lymph nodes likely reactive in etiology. No enlarged mediastinal or axillary lymph nodes. Thyroid gland, trachea, and esophagus demonstrate no significant findings. Lungs/Pleura: Diffuse interlobular septal wall thickening. Peribronchovascular ground-glass and consolidative airspace opacities most  prominent within the lower lobes. No pulmonary nodule. No pulmonary mass. Bilateral trace to small volume pleural effusions. No pneumothorax. Upper Abdomen: No acute abnormality. Musculoskeletal: No chest wall abnormality. No suspicious lytic or blastic osseous lesions. No acute displaced fracture. Multilevel degenerative changes of the spine. Total right shoulder arthroplasty. Review of the MIP images confirms the above findings. IMPRESSION: 1. Pulmonary edema with likely superimposed infection most prominent within the bilateral lower lobes. 2. Bilateral trace to small volume pleural effusions. 3. Upper limits of normal bilateral hilar lymph nodes likely reactive in etiology. 4.  Aortic Atherosclerosis (ICD10-I70.0). Electronically Signed   By: Morgane  Naveau M.D.   On: 09/06/2022 18:22   DG Chest 1 View  Result Date: 09/06/2022 CLINICAL DATA:  HTN (hypertension) EXAM: CHEST  1 VIEW COMPARISON:  Chest x-ray November 10, 2017. FINDINGS: Small pleural effusion. Left greater than right bibasilar opacities. No visible pneumothorax. Cardiomediastinal silhouette is within normal limits. Right reverse shoulder arthroplasty. S-shaped thoracolumbar curvature. Polyarticular degenerative change. IMPRESSION: Small left pleural effusion. Left greater than right bibasilar opacities could represent atelectasis, aspiration, and/or pneumonia. Electronically Signed   By: Frederick S Jones M.D.   On: 09/06/2022 15:54    Patient Profile     81 y.o. female a hx of HTN, HLD, LBBB on EKG, history of PAT, history of breast cancer, anxiety, depression who is being seen 09/07/2022 for the evaluation of elevated troponin at the request of Dr. Pahwani.   Assessment & Plan    Elevated Troponin  Flash Pulmonary Edema  DCM - Patient presented to the ED complaining of nausea and decreased oral intake for the past month. Had been having increased weakness and weight loss over the past month as well - After receiving IV fluids in the  ED, she developed acute onset shortness of breath and a wet cough. Initially required Bi-PAP and became tachycardic with HR to the 120s  - hsTn 31>248>476>558>867>695  - Patient received IV lasix 120 mg of IV Lasix initially good urine output - Now on PO lasix 40 mg daily - She put out 1.45 L yesterday and is net -2.1 L since admission - Echo showed moderate LV dysfunction with EF 30 to 35% with mid to apical inferior/inferior septal/anterior septal, apical anterior and lateral hypokinesis and hypokinesis of the apex. - Did not have significant shortness of breath or chest pain prior to admission  -   Suspicious that she may have had a stress-induced MI/cardiomyopathy- - is n.p.o. for left heart catheterization today to define coronary anatomy - Continue aspirin 81 mg daily, IV heparin drip, Crestor 20 mg daily and beta-blocker - Got a diffuse body rash on Benicar HCT as well as significant lower extremity edema and felt terrible on valsartan so we will hold off for now on starting ARB or Entresto apparently got lower extremity edema with Benicar HCT and palpitations with Diovan. - Start Farxiga 10 mg daily - Ultimately will benefit from addition of spironolactone which likely will add at discharge   Tremors  Nausea  - Patient complained that she had full body tremors in the ED  Symptoms improved with ativan  - In the ED, family expressed concern that patient drank alcohol heavily. Patient reports only drinking 1-2 glasses of wine per night  - On CIWA protocol per primary. She does have some symptoms of withdrawal (nausea, tremors, anxiety, tachycardia). - Management per primary   HTN  -BP is controlled on exam  -Continue Toprol-XL 200 mg daily    Hypokalemia -Replete today -Repeat be met in a.m. -Mag was low yesterday at 1.6 was given 2 g IV yesterday -Repeat mag level today  I have spent a total of 30 minutes with patient reviewing 2D echo , telemetry, EKGs, labs and examining patient  as well as establishing an assessment and plan that was discussed with the patient.  > 50% of time was spent in direct patient care.       For questions or updates, please contact [facility] Please consult [email or web address] for contact info under        Signed, Kyrstan Gotwalt, MD  09/08/2022, 9:34 AM   

## 2022-09-08 NOTE — Progress Notes (Addendum)
   Heart Failure Stewardship Pharmacist Progress Note   PCP: Gweneth Dimitri, MD PCP-Cardiologist: Norman Herrlich, MD    HPI:  82 yo F with PMH of HTN, HLD, anxiety, depression, history of breast cancer, and LBBB on EKG.  Presented to the ED on 4/13 with nausea and vomiting for several weeks. After receiving IV fluids in the ED, she became very short of breath. Placed on bipap. CXR showed pulmonary edema and she was started on IV Lasix. ECHO on 4/14 showed EF reduced to 30-35%, regional wall motion abnormalities, findings concerning for possible LAD disease vs takostubo cardiomyopathy, mild LVH, and RV normal. Scheduled for LHC today.  Current HF Medications: Beta Blocker: metoprolol XL 200 mg daily SGLT2i: Farxiga 10 mg daily  Prior to admission HF Medications: Beta blocker: metoprolol XL 200 mg daily  Pertinent Lab Values: Serum creatinine 0.87, BUN 13, Potassium 3.3, Sodium 134, BNP 446.7, Magnesium 2.2  Vital Signs: Weight: 120 lbs (admission weight: 123 lbs) Blood pressure: 130-150/80s  Heart rate: 60-80s  I/O: -1.5L yesterday; net -2.3L  Medication Assistance / Insurance Benefits Check: Does the patient have prescription insurance?  Yes Type of insurance plan: BCBS Medicare  Does the patient qualify for medication assistance through manufacturers or grants?   Pending Eligible grants and/or patient assistance programs: pending Medication assistance applications in progress: none  Medication assistance applications approved: none Approved medication assistance renewals will be completed by: pending  Outpatient Pharmacy:  Prior to admission outpatient pharmacy: [facility] Is the patient willing to use [facility] pharmacy at discharge? Yes Is the patient willing to transition their outpatient pharmacy to utilize a [facility] outpatient pharmacy?   Pending    Assessment: 1. Acute systolic CHF (LVEF 30-35%), pending ischemic evaluation. [name] class II symptoms. - Off lasix  pending cath today. KCl 40 mEq x 1 given for replacement. Keep K>4 and Mg>2. - Continue metoprolol XL 200 mg daily - States she has diffuse body rash and LE edema with olmesartan and palpitations with valsartan. Holding off on trialing Entresto at this time. - Consider starting spironolactone after cath - Agree with starting Farxiga 10 mg daily   Plan: 1) Medication changes recommended at this time: - Agree with changes  2) Patient assistance: - Farxiga/Jardiance copay $45  3)  Education  - To be completed prior to discharge - Patient in cath - unavailable for education, will attempt tomorrow   Sharen Hones, PharmD, BCPS Heart Failure Stewardship Pharmacist Phone [phone]

## 2022-09-08 NOTE — Progress Notes (Signed)
PROGRESS NOTE    Jasmine Swanson  AVW:[id number] DOB: 13-Jun-1940 DOA: 09/06/2022 PCP: Gweneth Dimitri, MD   Brief Narrative:  HPI: 82 year old female history of hypertension, hyperlipidemia, prior history of breast cancer presents to the ER today with feeling shaky at home.  She states that she woke up and felt somewhat strange.  She had some abdominal discomfort and irritable bowel like syndrome symptoms.  She denies any diarrhea but just felt that her intestines were moving around a lot.  She denies any fever, chills.  She called her friend due to shaking at home.  Her friend brought her to the ER.   While in the ER and undergoing workup, she received some IV fluids.  She began feeling short of breath afterwards.  She became hypoxic.  She required BiPAP.  Chest x-ray showed pulmonary edema and she was started on IV Lasix.  She was weaned off BiPAP.  Patient does not have a history of CHF.   Her initial troponin was 31.  Increased to 248 and then up to 476.   EKG showed normal sinus rhythm with an IVCD.  This is different from her EKG from February 2023.  Patient denies any history of cardiac disease.  She states that she has had 2 cardiac catheterizations in the past but has not had any stents placed.   Patient transferred to [facility].   As far as her alcohol intake is concerned, the patient states that she has 1 glass of wine each day.  Does not drink any beer or liquor.  Patient states that she does not have a problem with alcohol.    ED Course: received 500 ml IVF and developed acute pulmonary edema. Started on bipap and given 60 mg IV lasix. CTPA negative for PE.  Assessment & Plan:   Principal Problem:   Acute respiratory failure with hypoxia Active Problems:   Acute pulmonary edema   Elevated troponin   Primary hypertension   Hyperlipidemia   DNR (do not resuscitate)   DCM (dilated cardiomyopathy)   Acute systolic CHF (congestive heart failure)    Hypokalemia  Acute hypoxic respiratory failure secondary to acute flash pulmonary edema/new diagnosis of acute systolic congestive heart failure/NSTEMI Cardiology consulted, their initial opinion was that her elevated troponins were secondary to demand ischemia however echo shows reduced ejection fraction of 30 to 35% with wall motion abnormality indicating NSTEMI.  Patient is asymptomatic.  Patient is scheduled for cardiac cath today, likely has LAD disease.   Hyperlipidemia Continue Crestor.   Essential hypertension Blood pressure controlled.  Continue home dose of Toprol-XL.  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  She is getting scheduled Ativan.  I will discontinue them.  Will place her on CIWA protocol with as needed Ativan.  No signs of withdrawal today.    Nausea and weakness/ ?  Gastritis?  Esophagitis: Her heavy drink and raises concern for possible esophagitis and gastritis as the source of her symptoms.  Started on Protonix twice daily.  Symptoms improved.  DVT prophylaxis: SCDs Start: 09/07/22 0009   Code Status: DNR  Family Communication: Daughter present at bedside.  Plan of care discussed with patient in length and he/she verbalized understanding and agreed with it.  Status is: Inpatient Remains inpatient appropriate because: Cardiac cath today.   Estimated body mass index is 21.28 kg/m as calculated from the following:   Height as of this encounter:  (1.6 m).  Weight as of this encounter: 54.5 kg.    Nutritional Assessment: Body mass index is 21.28 kg/m.Marland Kitchen Seen by dietician.  I agree with the assessment and plan as outlined below: Nutrition Status:        . Skin Assessment: I have examined the patient's skin and I agree with the wound assessment as performed by the wound care RN as outlined below:    Consultants:  Cardiology  Procedures:  None  Antimicrobials:  Anti-infectives (From admission,  onward)    Start     Dose/Rate Route Frequency Ordered Stop   09/06/22 1915  Ampicillin-Sulbactam (UNASYN) 3 g in sodium chloride 0.9 % 100 mL IVPB        3 g 200 mL/hr over 30 Minutes Intravenous  Once 09/06/22 1900 09/07/22 0734         Subjective: Patient seen and examined.  She said that she is feeling better today.  She had no complaints.  Objective: Vitals:   09/07/22 2100 09/07/22 2350 09/08/22 0403 09/08/22 0807  BP: 122/62 132/81 131/81 (!) 158/80  Pulse: 78 78 65 69  Resp:  18 18 18   Temp:  98.3 F (36.8 C) 97.6 F (36.4 C) 97.8 F (36.6 C)  TempSrc:  Oral Oral Oral  SpO2:  97% 97% 98%  Weight:   54.5 kg   Height:        Intake/Output Summary (Last 24 hours) at 09/08/2022 1323 Last data filed at 09/08/2022 1100 Gross per 24 hour  Intake 522.92 ml  Output 1100 ml  Net -577.08 ml    Filed Weights   09/06/22 2100 09/07/22 0451 09/08/22 0403  Weight: 55.8 kg 54.6 kg 54.5 kg    Examination:  General exam: Appears calm and comfortable  Respiratory system: Clear to auscultation. Respiratory effort normal. Cardiovascular system: S1 & S2 heard, RRR. No JVD, murmurs, rubs, gallops or clicks. No pedal edema. Gastrointestinal system: Abdomen is nondistended, soft and nontender. No organomegaly or masses felt. Normal bowel sounds heard. Central nervous system: Alert and oriented. No focal neurological deficits. Extremities: Symmetric 5 x 5 power. Skin: No rashes, lesions or ulcers.   Data Reviewed: I have personally reviewed following labs and imaging studies  CBC: Recent Labs  Lab 09/06/22 1523 09/06/22 1807 09/07/22 0102 09/08/22 0246  WBC 6.4  --  7.1 6.4  NEUTROABS 4.6  --  5.6 3.7  HGB 12.7 12.9 12.1 11.7*  HCT 37.5 38.0 34.8* 33.9*  MCV 94.2  --  93.5 93.1  PLT 159  --  172 160    Basic Metabolic Panel: Recent Labs  Lab 09/06/22 1523 09/06/22 1807 09/07/22 0102 09/08/22 0246  NA 137 138 135 134*  K 3.6 3.7 3.6 3.3*  CL 105  --  102 98   CO2 20*  --  23 26  GLUCOSE 117*  --  122* 131*  BUN 11  --  10 13  CREATININE 0.79  --  0.91 0.87  CALCIUM 8.5*  --  8.2* 8.6*  MG  --   --  1.6* 2.2    GFR: Estimated Creatinine Clearance: 40.5 mL/min (by C-G formula based on SCr of 0.87 mg/dL). Liver Function Tests: Recent Labs  Lab 09/06/22 1523 09/07/22 0102  AST 30 30  ALT 21 21  ALKPHOS 45 40  BILITOT 1.0 1.1  PROT 7.1 6.3*  ALBUMIN 4.1 3.6    Recent Labs  Lab 09/06/22 1523  LIPASE 24    No results for input(s): "AMMONIA" in the  last 168 hours. Coagulation Profile: No results for input(s): "INR", "PROTIME" in the last 168 hours. Cardiac Enzymes: No results for input(s): "CKTOTAL", "CKMB", "CKMBINDEX", "TROPONINI" in the last 168 hours. BNP (last 3 results) No results for input(s): "PROBNP" in the last 8760 hours. HbA1C: No results for input(s): "HGBA1C" in the last 72 hours. CBG: Recent Labs  Lab 09/07/22 0145  GLUCAP 114*    Lipid Profile: No results for input(s): "CHOL", "HDL", "LDLCALC", "TRIG", "CHOLHDL", "LDLDIRECT" in the last 72 hours. Thyroid Function Tests: No results for input(s): "TSH", "T4TOTAL", "FREET4", "T3FREE", "THYROIDAB" in the last 72 hours. Anemia Panel: No results for input(s): "VITAMINB12", "FOLATE", "FERRITIN", "TIBC", "IRON", "RETICCTPCT" in the last 72 hours. Sepsis Labs: Recent Labs  Lab 09/07/22 0102  PROCALCITON <0.10     Recent Results (from the past 240 hour(s))  Resp panel by RT-PCR (RSV, Flu A&B, Covid) Anterior Nasal Swab     Status: None   Collection Time: 09/06/22  7:33 PM   Specimen: Anterior Nasal Swab  Result Value Ref Range Status   SARS Coronavirus 2 by RT PCR NEGATIVE NEGATIVE Final    Comment: (NOTE) SARS-CoV-2 target nucleic acids are NOT DETECTED.  The SARS-CoV-2 RNA is generally detectable in upper respiratory specimens during the acute phase of infection. The lowest concentration of SARS-CoV-2 viral copies this assay can detect is 138  copies/mL. A negative result does not preclude SARS-Cov-2 infection and should not be used as the sole basis for treatment or other patient management decisions. A negative result may occur with  improper specimen collection/handling, submission of specimen other than nasopharyngeal swab, presence of viral mutation(s) within the areas targeted by this assay, and inadequate number of viral copies(<138 copies/mL). A negative result must be combined with clinical observations, patient history, and epidemiological information. The expected result is Negative.  Fact Sheet for Patients:  BloggerCourse.com  Fact Sheet for Healthcare Providers:  SeriousBroker.it  This test is no t yet approved or cleared by the [place] FDA and  has been authorized for detection and/or diagnosis of SARS-CoV-2 by FDA under an Emergency Use Authorization (EUA). This EUA will remain  in effect (meaning this test can be used) for the duration of the COVID-19 declaration under Section 564(b)(1) of the Act, 21 U.S.C.section 360bbb-3(b)(1), unless the authorization is terminated  or revoked sooner.       Influenza A by PCR NEGATIVE NEGATIVE Final   Influenza B by PCR NEGATIVE NEGATIVE Final    Comment: (NOTE) The Xpert Xpress SARS-CoV-2/FLU/RSV plus assay is intended as an aid in the diagnosis of influenza from Nasopharyngeal swab specimens and should not be used as a sole basis for treatment. Nasal washings and aspirates are unacceptable for Xpert Xpress SARS-CoV-2/FLU/RSV testing.  Fact Sheet for Patients: BloggerCourse.com  Fact Sheet for Healthcare Providers: SeriousBroker.it  This test is not yet approved or cleared by the [place] FDA and has been authorized for detection and/or diagnosis of SARS-CoV-2 by FDA under an Emergency Use Authorization (EUA). This EUA will remain in effect (meaning  this test can be used) for the duration of the COVID-19 declaration under Section 564(b)(1) of the Act, 21 U.S.C. section 360bbb-3(b)(1), unless the authorization is terminated or revoked.     Resp Syncytial Virus by PCR NEGATIVE NEGATIVE Final    Comment: (NOTE) Fact Sheet for Patients: BloggerCourse.com  Fact Sheet for Healthcare Providers: SeriousBroker.it  This test is not yet approved or cleared by the [place] FDA and has been authorized for detection  and/or diagnosis of SARS-CoV-2 by FDA under an Emergency Use Authorization (EUA). This EUA will remain in effect (meaning this test can be used) for the duration of the COVID-19 declaration under Section 564(b)(1) of the Act, 21 U.S.C. section 360bbb-3(b)(1), unless the authorization is terminated or revoked.  Performed at [facility], [address] [place], [place] [address]      Radiology Studies: ECHOCARDIOGRAM COMPLETE  Result Date: 09/07/2022    ECHOCARDIOGRAM REPORT   Patient Name:   Jasmine Swanson [facility] Date of Exam: 09/07/2022 Medical Rec #:  [id number]         Height:       63.0 in Accession #:    [id number]        Weight:       120.4 lb Date of Birth:  [birth year]         BSA:          1.558 m Patient Age:    81 years          BP:           144/73 mmHg Patient Gender: F                 HR:           90 bpm. Exam Location:  Inpatient Procedure: 2D Echo, Cardiac Doppler, Color Doppler and Strain Analysis Indications:    Acute pulmonary edema  History:        Patient has no prior history of Echocardiogram examinations.                 Arrythmias:Tachycardia; Risk Factors:Hypertension and                 Dyslipidemia. Breast Cancer of R breast.  Sonographer:    Lucendia Herrlich Referring Phys: [id number] ERIC CHEN IMPRESSIONS  1. Left ventricular ejection fraction, by estimation, is 30 to 35%. The left ventricle has moderately decreased function. The left ventricle  demonstrates regional wall motion abnormalities (see scoring diagram/findings for description). The mid-to-apical inferoseptal, mid-to-apical anteroseptal, mid-to-apical inferior, apical anterior, apical lateral and apex are severely hypokinetic. Findings concerning for possible LAD disease vs takostubo cardiomyopathy. There is mild concentric left ventricular hypertrophy. Indeterminate diastolic filling due to E-A fusion.  2. Right ventricular systolic function is normal. The right ventricular size is normal.  3. Left atrial size was mildly dilated.  4. The mitral valve is grossly normal. Trivial mitral valve regurgitation. No evidence of mitral stenosis.  5. The aortic valve is tricuspid. There is mild calcification of the aortic valve. There is mild thickening of the aortic valve. Aortic valve regurgitation is not visualized. Aortic valve sclerosis/calcification is present, without any evidence of aortic stenosis.  6. The inferior vena cava is dilated in size with >50% respiratory variability, suggesting right atrial pressure of 8 mmHg. FINDINGS  Left Ventricle: The mid-to-apical inferoseptum, mid-to-apical anteroseptal, mid-to-apical inferior, apical anterior, apical lateral and the LV apex are severely hypokinetic. Left ventricular ejection fraction, by estimation, is 30 to 35%. The left ventricle has moderately decreased function. The left ventricle demonstrates regional wall motion abnormalities. Global longitudinal strain performed but not reported based on interpreter judgement due to suboptimal tracking. The left ventricular internal cavity size was normal in size. There is mild concentric left ventricular hypertrophy. Indeterminate diastolic filling due to E-A fusion. Right Ventricle: The right ventricular size is normal. No increase in right ventricular wall thickness. Right ventricular systolic function is normal. Left Atrium: Left atrial  size was mildly dilated. Right Atrium: Right atrial size was  normal in size. Pericardium: There is no evidence of pericardial effusion. Mitral Valve: The mitral valve is grossly normal. Trivial mitral valve regurgitation. No evidence of mitral valve stenosis. Tricuspid Valve: The tricuspid valve is normal in structure. Tricuspid valve regurgitation is not demonstrated. Aortic Valve: The aortic valve is tricuspid. There is mild calcification of the aortic valve. There is mild thickening of the aortic valve. Aortic valve regurgitation is not visualized. Aortic valve sclerosis/calcification is present, without any evidence of aortic stenosis. Aortic valve peak gradient measures 6.4 mmHg. Pulmonic Valve: The pulmonic valve was normal in structure. Pulmonic valve regurgitation is not visualized. Aorta: The aortic root and ascending aorta are structurally normal, with no evidence of dilitation. Venous: The inferior vena cava is dilated in size with greater than 50% respiratory variability, suggesting right atrial pressure of 8 mmHg. IAS/Shunts: The atrial septum is grossly normal.  LEFT VENTRICLE PLAX 2D LVIDd:         4.30 cm     Diastology LVIDs:         3.50 cm     LV e' medial:    8.38 cm/s LV PW:         1.20 cm     LV E/e' medial:  13.4 LV IVS:        1.20 cm     LV e' lateral:   7.89 cm/s LVOT diam:     1.90 cm     LV E/e' lateral: 14.2 LV SV:         43 LV SV Index:   28 LVOT Area:     2.84 cm  LV Volumes (MOD) LV vol d, MOD A2C: 73.8 ml LV vol d, MOD A4C: 65.7 ml LV vol s, MOD A2C: 43.6 ml LV vol s, MOD A4C: 42.2 ml LV SV MOD A2C:     30.2 ml LV SV MOD A4C:     65.7 ml LV SV MOD BP:      26.0 ml RIGHT VENTRICLE            IVC RV S prime:     7.77 cm/s  IVC diam: 2.00 cm TAPSE (M-mode): 1.4 cm LEFT ATRIUM             Index        RIGHT ATRIUM          Index LA diam:        2.90 cm 1.86 cm/m   RA Area:     9.51 cm LA Vol (A2C):   57.5 ml 36.89 ml/m  RA Volume:   18.20 ml 11.68 ml/m LA Vol (A4C):   48.9 ml 31.38 ml/m LA Biplane Vol: 54.9 ml 35.23 ml/m  AORTIC VALVE AV  Area (Vmax): 2.00 cm AV Vmax:        126.00 cm/s AV Peak Grad:   6.4 mmHg LVOT Vmax:      88.97 cm/s LVOT Vmean:     56.267 cm/s LVOT VTI:       0.151 m  AORTA Ao Root diam: 2.90 cm Ao Asc diam:  2.70 cm MITRAL VALVE MV Area (PHT): 5.09 cm     SHUNTS MV Decel Time: 149 msec     Systemic VTI:  0.15 m MV E velocity: 112.00 cm/s  Systemic Diam: 1.90 cm Laurance Flatten MD Electronically signed by Laurance Flatten MD Signature Date/Time: 09/07/2022/3:00:05 PM    Final    CT Angio  Chest PE W and/or Wo Contrast  Result Date: 09/06/2022 CLINICAL DATA:  Pulmonary embolism (PE) suspected, high prob EXAM: CT ANGIOGRAPHY CHEST WITH CONTRAST TECHNIQUE: Multidetector CT imaging of the chest was performed using the standard protocol during bolus administration of intravenous contrast. Multiplanar CT image reconstructions and MIPs were obtained to evaluate the vascular anatomy. RADIATION DOSE REDUCTION: This exam was performed according to the departmental dose-optimization program which includes automated exposure control, adjustment of the mA and/or kV according to patient size and/or use of iterative reconstruction technique. CONTRAST:  80mL OMNIPAQUE IOHEXOL 350 MG/ML SOLN COMPARISON:  None Available. FINDINGS: Cardiovascular: Satisfactory opacification of the pulmonary arteries to the segmental level. No evidence of pulmonary embolism. Normal heart size. No significant pericardial effusion. The thoracic aorta is normal in caliber. Severe atherosclerotic plaque of the thoracic aorta. No coronary artery calcifications. Mediastinum/Nodes: Upper limits of normal bilateral hilar lymph nodes likely reactive in etiology. No enlarged mediastinal or axillary lymph nodes. Thyroid gland, trachea, and esophagus demonstrate no significant findings. Lungs/Pleura: Diffuse interlobular septal wall thickening. Peribronchovascular ground-glass and consolidative airspace opacities most prominent within the lower lobes. No pulmonary  nodule. No pulmonary mass. Bilateral trace to small volume pleural effusions. No pneumothorax. Upper Abdomen: No acute abnormality. Musculoskeletal: No chest wall abnormality. No suspicious lytic or blastic osseous lesions. No acute displaced fracture. Multilevel degenerative changes of the spine. Total right shoulder arthroplasty. Review of the MIP images confirms the above findings. IMPRESSION: 1. Pulmonary edema with likely superimposed infection most prominent within the bilateral lower lobes. 2. Bilateral trace to small volume pleural effusions. 3. Upper limits of normal bilateral hilar lymph nodes likely reactive in etiology. 4.  Aortic Atherosclerosis (ICD10-I70.0). Electronically Signed   By: Tish Frederickson M.D.   On: 09/06/2022 18:22   DG Chest 1 View  Result Date: 09/06/2022 CLINICAL DATA:  HTN (hypertension) EXAM: CHEST  1 VIEW COMPARISON:  Chest x-ray November 10, 2017. FINDINGS: Small pleural effusion. Left greater than right bibasilar opacities. No visible pneumothorax. Cardiomediastinal silhouette is within normal limits. Right reverse shoulder arthroplasty. S-shaped thoracolumbar curvature. Polyarticular degenerative change. IMPRESSION: Small left pleural effusion. Left greater than right bibasilar opacities could represent atelectasis, aspiration, and/or pneumonia. Electronically Signed   By: Feliberto Harts M.D.   On: 09/06/2022 15:54    Scheduled Meds:  aspirin EC  81 mg Oral Daily   dapagliflozin propanediol  10 mg Oral Daily   folic acid  1 mg Oral Daily   metoprolol  200 mg Oral QPM   multivitamin with minerals  1 tablet Oral Daily   pantoprazole (PROTONIX) IV  40 mg Intravenous Q12H   rosuvastatin  20 mg Oral QPM   sodium chloride flush  3 mL Intravenous Q12H   thiamine  100 mg Oral Daily   Or   thiamine  100 mg Intravenous Daily   Continuous Infusions:  sodium chloride     sodium chloride 10 mL/hr at 09/08/22 8638   heparin 550 Units/hr (09/08/22 0434)      LOS: 2  days   Hughie Closs, MD [facility]  09/08/2022, 1:23 PM   *Please note that this is a verbal dictation therefore any spelling or grammatical errors are due to the "Dragon Medical One" system interpretation.  Please page via Amion and do not message via secure chat for urgent patient care matters. Secure chat can be used for non urgent patient care matters.  How to contact the [facility] Attending or Consulting provider 7A - 7P or covering provider  during after hours 7P -7A, for this patient?  Check the care team in [facility] and look for a) attending/consulting [facility] provider listed and b) the [facility] team listed. Page or secure chat 7A-7P. Log into [email or web address] and use [place]'s universal password to access. If you do not have the password, please contact the hospital operator. Locate the [facility] provider you are looking for under [facility] and page to a number that you can be directly reached. If you still have difficulty reaching the provider, please page the [facility] (Director on Call) for the Hospitalists listed on amion for assistance.

## 2022-09-08 NOTE — Plan of Care (Signed)
  Problem: Pain Managment: Goal: General experience of comfort will improve Outcome: Completed/Met   

## 2022-09-08 NOTE — Progress Notes (Signed)
Physical Therapy Treatment Patient Details Name: Jasmine Swanson MRN: [id number] DOB: 1940/08/24 Today's Date: 09/08/2022   History of Present Illness 82 year old female who presented 09/06/22 after feeling shaky and weak at home. Chest x-ray showed pulmonary edema, further work up revealed elevated troponin and alcohol dependence. PMHx: hypertension, hyperlipidemia, anxiety, depression, prior history of breast cancer, diplopia, etoh    PT Comments    Pt tolerated treatment well today. Pt was able to ambulate in the hallway and navigate stairs at supervision level with no AD. Pt presents at or near baseline mobility. Pt has no further acute PT needs and will be signing off. Re consult PT if mobility status changes. Pt scheduled for cardiac cath today.   Recommendations for follow up therapy are one component of a multi-disciplinary discharge planning process, led by the attending physician.  Recommendations may be updated based on patient status, additional functional criteria and insurance authorization.  Follow Up Recommendations       Assistance Recommended at Discharge PRN  Patient can return home with the following A little help with walking and/or transfers;A little help with bathing/dressing/bathroom;Assistance with cooking/housework;Assist for transportation;Help with stairs or ramp for entrance   Equipment Recommendations  None recommended by PT    Recommendations for Other Services       Precautions / Restrictions Precautions Precautions: Fall Restrictions Weight Bearing Restrictions: No     Mobility  Bed Mobility Overal bed mobility: Modified Independent             General bed mobility comments: HOB elevated, no assistance needed    Transfers Overall transfer level: Independent Equipment used: None Transfers: Sit to/from Stand Sit to Stand: Independent           General transfer comment: No LOB noted    Ambulation/Gait Ambulation/Gait  assistance: Supervision Gait Distance (Feet): 150 Feet Assistive device: None Gait Pattern/deviations: Step-through pattern, Decreased stride length, Shuffle, Narrow base of support Gait velocity: reduced     General Gait Details: no LOB noted. Slow cautious gait.   Stairs Stairs: Yes Stairs assistance: Supervision Stair Management: One rail Left, Alternating pattern, Forwards Number of Stairs: 5 General stair comments: no LOB noted   Wheelchair Mobility    Modified Rankin (Stroke Patients Only)       Balance Overall balance assessment: Mild deficits observed, not formally tested                                          Cognition Arousal/Alertness: Awake/alert Behavior During Therapy: WFL for tasks assessed/performed Overall Cognitive Status: Within Functional Limits for tasks assessed                                          Exercises      General Comments General comments (skin integrity, edema, etc.): VSS on RA      Pertinent Vitals/Pain Pain Assessment Pain Assessment: No/denies pain    Home Living                          Prior Function            PT Goals (current goals can now be found in the care plan section) Progress towards PT goals: Goals met/education completed, patient discharged from PT  Frequency    Other (Comment) (DCPT)      PT Plan Other (comment) (DCPT)    Co-evaluation              AM-PAC PT "6 Clicks" Mobility   Outcome Measure  Help needed turning from your back to your side while in a flat bed without using bedrails?: None Help needed moving from lying on your back to sitting on the side of a flat bed without using bedrails?: None Help needed moving to and from a bed to a chair (including a wheelchair)?: A Little Help needed standing up from a chair using your arms (e.g., wheelchair or bedside chair)?: A Little Help needed to walk in hospital room?: A Little Help  needed climbing 3-5 steps with a railing? : A Little 6 Click Score: 20    End of Session Equipment Utilized During Treatment: Gait belt Activity Tolerance: Patient tolerated treatment well Patient left:  (Handoff to mobility specialist in hallway) Nurse Communication: Mobility status PT Visit Diagnosis: Unsteadiness on feet (R26.81);Other abnormalities of gait and mobility (R26.89);Muscle weakness (generalized) (M62.81)     Time: 7829-5621 PT Time Calculation (min) (ACUTE ONLY): 8 min  Charges:  $Gait Training: 8-22 mins                     Shela Nevin, PT, DPT Acute Rehab Services [phone]    Gladys Damme 09/08/2022, 12:39 PM

## 2022-09-08 NOTE — Progress Notes (Signed)
Mobility Specialist Progress Note:   09/08/22 1000  Mobility  Activity Ambulated independently in hallway  Level of Assistance Independent  Assistive Device None  Distance Ambulated (ft) 200 ft  Activity Response Tolerated well  Mobility Referral Yes  $Mobility charge 1 Mobility   Pt received post-PT stair session. Required no physical assistance throughout hallway ambulation. Pt eager for cath today, left in bed with all needs met. Family member present.   Addison Lank Mobility Specialist Please contact via SecureChat or  Rehab office at [phone]

## 2022-09-08 NOTE — Progress Notes (Signed)
ANTICOAGULATION CONSULT NOTE   Pharmacy Consult for heparin Indication: chest pain/ACS  Allergies  Allergen Reactions   Amlodipine Swelling   Benicar Hct [Olmesartan Medoxomil-Hctz] Other (See Comments)    Edema of lower extremities    Codeine Other (See Comments)    Drops blood pressure   Diovan [Valsartan] Other (See Comments)    Palpitations.     Niacin And Related Other (See Comments)    Turns patient red, makes her pass out   Ropinirole Hcl Other (See Comments)    Patient Measurements: Height: 5\' 3"  (160 cm) Weight: 54.5 kg (120 lb 2.4 oz) IBW/kg (Calculated) : 52.4 Heparin Dosing Weight: 55.8 kg   Vital Signs: Temp: 97.6 F (36.4 C) (04/15 0403) Temp Source: Oral (04/15 0403) BP: 131/81 (04/15 0403) Pulse Rate: 65 (04/15 0403)  Labs: Recent Labs    09/06/22 1523 09/06/22 1736 09/06/22 1807 09/06/22 2007 09/06/22 2138 09/07/22 0102 09/07/22 0209 09/07/22 0537 09/08/22 0246  HGB 12.7  --  12.9  --   --  12.1  --   --  11.7*  HCT 37.5  --  38.0  --   --  34.8*  --   --  33.9*  PLT 159  --   --   --   --  172  --   --  160  HEPARINUNFRC  --   --   --   --   --   --   --   --  0.94*  CREATININE 0.79  --   --   --   --  0.91  --   --  0.87  TROPONINIHS 31*   < >  --    < > 558*  --  867* 695*  --    < > = values in this interval not displayed.    Estimated Creatinine Clearance: 40.5 mL/min (by C-G formula based on SCr of 0.87 mg/dL).   Medical History: Past Medical History:  Diagnosis Date   Anxiety    Arthritis    Bundle branch block, left    Cancer    Complication of anesthesia    laryngeal spams 50 yrs ago   Depression    Dysphagia    Essential hypertension 06/17/2013   GERD (gastroesophageal reflux disease)    Hyperlipidemia    Kyphosis    PAC (premature atrial contraction) 06/17/2013   PAT (paroxysmal atrial tachycardia) 06/17/2013   Plantar fasciitis of right foot    Preop cardiovascular exam 10/16/2017   Primary localized  osteoarthrosis of right shoulder 11/10/2017   Right shoulder pain 01/20/2012    Medications:  Scheduled:   aspirin EC  81 mg Oral Daily   furosemide  40 mg Oral Daily   LORazepam  0-4 mg Intravenous Q6H   Or   LORazepam  0-4 mg Oral Q6H   [START ON 09/09/2022] LORazepam  0-4 mg Intravenous Q12H   Or   [START ON 09/09/2022] LORazepam  0-4 mg Oral Q12H   metoprolol  200 mg Oral QPM   pantoprazole (PROTONIX) IV  40 mg Intravenous Q12H   rosuvastatin  20 mg Oral QPM   sodium chloride flush  3 mL Intravenous Q12H   thiamine  100 mg Oral Daily   Or   thiamine  100 mg Intravenous Daily    Assessment: 81 yof presenting with N/V for several weeks, did develop SOB and tachycardia after receiving IVF - no AC PTA.   Heparin level above goal on 650 units/hr -  Per RN no overt s/sx of bleeding; CBC stable  Goal of Therapy:  Heparin level 0.3-0.7 units/ml Monitor platelets by anticoagulation protocol: Yes   Plan:  Reduce heparin infusion to 550 units/hr Check anti-Xa level in 8 hours and daily while on heparin Continue to monitor H&H and platelets  Ruben Im, PharmD Clinical Pharmacist 09/08/2022 4:15 AM Please check AMION for all [facility] Pharm[facility] numbers

## 2022-09-09 ENCOUNTER — Other Ambulatory Visit: Payer: Self-pay | Admitting: Cardiology

## 2022-09-09 ENCOUNTER — Encounter (HOSPITAL_COMMUNITY): Payer: Self-pay | Admitting: Internal Medicine

## 2022-09-09 ENCOUNTER — Other Ambulatory Visit (HOSPITAL_COMMUNITY): Payer: Self-pay

## 2022-09-09 DIAGNOSIS — J9601 Acute respiratory failure with hypoxia: Secondary | ICD-10-CM | POA: Diagnosis not present

## 2022-09-09 DIAGNOSIS — E876 Hypokalemia: Secondary | ICD-10-CM

## 2022-09-09 LAB — CBC WITH DIFFERENTIAL/PLATELET
Abs Immature Granulocytes: 0.02 10*3/uL (ref 0.00–0.07)
Basophils Absolute: 0 10*3/uL (ref 0.0–0.1)
Basophils Relative: 1 %
Eosinophils Absolute: 0.1 10*3/uL (ref 0.0–0.5)
Eosinophils Relative: 1 %
HCT: 35.9 % — ABNORMAL LOW (ref 36.0–46.0)
Hemoglobin: 11.7 g/dL — ABNORMAL LOW (ref 12.0–15.0)
Immature Granulocytes: 0 %
Lymphocytes Relative: 30 %
Lymphs Abs: 2 10*3/uL (ref 0.7–4.0)
MCH: 32.4 pg (ref 26.0–34.0)
MCHC: 32.6 g/dL (ref 30.0–36.0)
MCV: 99.4 fL (ref 80.0–100.0)
Monocytes Absolute: 0.7 10*3/uL (ref 0.1–1.0)
Monocytes Relative: 11 %
Neutro Abs: 3.7 10*3/uL (ref 1.7–7.7)
Neutrophils Relative %: 57 %
Platelets: 149 10*3/uL — ABNORMAL LOW (ref 150–400)
RBC: 3.61 MIL/uL — ABNORMAL LOW (ref 3.87–5.11)
RDW: 12.4 % (ref 11.5–15.5)
WBC: 6.6 10*3/uL (ref 4.0–10.5)
nRBC: 0 % (ref 0.0–0.2)

## 2022-09-09 LAB — BASIC METABOLIC PANEL
Anion gap: 9 (ref 5–15)
BUN: 13 mg/dL (ref 8–23)
CO2: 25 mmol/L (ref 22–32)
Calcium: 8.6 mg/dL — ABNORMAL LOW (ref 8.9–10.3)
Chloride: 100 mmol/L (ref 98–111)
Creatinine, Ser: 1 mg/dL (ref 0.44–1.00)
GFR, Estimated: 56 mL/min — ABNORMAL LOW (ref >60)
Glucose, Bld: 115 mg/dL — ABNORMAL HIGH (ref 70–99)
Potassium: 3.4 mmol/L — ABNORMAL LOW (ref 3.5–5.1)
Sodium: 134 mmol/L — ABNORMAL LOW (ref 135–145)

## 2022-09-09 MED ORDER — DAPAGLIFLOZIN PROPANEDIOL 10 MG PO TABS
10.0000 mg | ORAL_TABLET | Freq: Every day | ORAL | 0 refills | Status: DC
  Filled 2022-09-09 (×2): qty 30, 30d supply, fill #0

## 2022-09-09 MED ORDER — SPIRONOLACTONE 25 MG PO TABS
12.5000 mg | ORAL_TABLET | Freq: Every day | ORAL | 0 refills | Status: DC
  Filled 2022-09-09: qty 15, 30d supply, fill #0

## 2022-09-09 MED ORDER — POTASSIUM CHLORIDE CRYS ER 20 MEQ PO TBCR
20.0000 meq | EXTENDED_RELEASE_TABLET | Freq: Every day | ORAL | 0 refills | Status: DC
  Filled 2022-09-09: qty 30, 30d supply, fill #0

## 2022-09-09 MED ORDER — POTASSIUM CHLORIDE CRYS ER 20 MEQ PO TBCR
20.0000 meq | EXTENDED_RELEASE_TABLET | Freq: Every day | ORAL | Status: DC
  Filled 2022-09-09: qty 1

## 2022-09-09 MED ORDER — POTASSIUM CHLORIDE CRYS ER 20 MEQ PO TBCR
40.0000 meq | EXTENDED_RELEASE_TABLET | Freq: Once | ORAL | Status: AC
  Administered 2022-09-09: 40 meq via ORAL
  Filled 2022-09-09: qty 2

## 2022-09-09 MED ORDER — OMEPRAZOLE 40 MG PO CPDR
40.0000 mg | DELAYED_RELEASE_CAPSULE | Freq: Two times a day (BID) | ORAL | 0 refills | Status: DC
  Filled 2022-09-09: qty 60, 30d supply, fill #0

## 2022-09-09 MED ORDER — SPIRONOLACTONE 12.5 MG HALF TABLET
12.5000 mg | ORAL_TABLET | Freq: Every day | ORAL | Status: DC
  Filled 2022-09-09: qty 1

## 2022-09-09 MED ORDER — FUROSEMIDE 40 MG PO TABS
40.0000 mg | ORAL_TABLET | Freq: Every day | ORAL | 0 refills | Status: DC
  Filled 2022-09-09: qty 30, 30d supply, fill #0

## 2022-09-09 MED ORDER — FUROSEMIDE 40 MG PO TABS
40.0000 mg | ORAL_TABLET | Freq: Every day | ORAL | Status: DC
  Filled 2022-09-09: qty 1

## 2022-09-09 NOTE — Progress Notes (Signed)
Discharge instructions reviewed with pt and her daughter.   Copy of instructions given to pt. Scripts filled by [facility] and were delivered to pt's room. Pt and daughter verbalized understanding of instructions and able to teach back. Instructions discussed about cath site care and limitations.  Pt d/c'd via wheelchair with belongings, with daughter.           Escorted by staff.

## 2022-09-09 NOTE — Care Management Important Message (Signed)
Important Message  Patient Details  Name: Jasmine Swanson MRN: [id number] Date of Birth: 1940/10/13   Medicare Important Message Given:  Yes     Renie Ora 09/09/2022, 4:21 PM

## 2022-09-09 NOTE — Progress Notes (Addendum)
   Heart Failure Stewardship Pharmacist Progress Note   PCP: Gweneth Dimitri, MD PCP-Cardiologist: Norman Herrlich, MD    HPI:  82 yo F with PMH of HTN, HLD, anxiety, depression, history of breast cancer, and LBBB on EKG.  Presented to the ED on 4/13 with nausea and vomiting for several weeks. After receiving IV fluids in the ED, she became very short of breath. Placed on bipap. CXR showed pulmonary edema and she was started on IV Lasix. ECHO on 4/14 showed EF reduced to 30-35%, regional wall motion abnormalities, findings concerning for possible LAD disease vs Takotsubo cardiomyopathy, mild LVH, and RV normal. LHC 4/15 showed no significant CAD, mildly elevated LVEDP ~20. Plan medical management for Takotsubo cardiomyopathy.  Current HF Medications: Diuretic: furosemide 40 mg PO daily Beta Blocker: metoprolol XL 200 mg daily MRA: spironolactone 12.5 mg daily SGLT2i: Farxiga 10 mg daily  Prior to admission HF Medications: Beta blocker: metoprolol XL 200 mg daily  Pertinent Lab Values: Serum creatinine 1.00, BUN 13, Potassium 3.4, Sodium 134, BNP 446.7, Magnesium 2.2  Vital Signs: Weight: 122 lbs (admission weight: 123 lbs) Blood pressure: 110-140/60s  Heart rate: 60s  I/O: -0.8L yesterday; net -2.6L  Medication Assistance / Insurance Benefits Check: Does the patient have prescription insurance?  Yes Type of insurance plan: BCBS Medicare  Outpatient Pharmacy:  Prior to admission outpatient pharmacy: [facility] Is the patient willing to use [facility] TOC pharmacy at discharge? Yes Is the patient willing to transition their outpatient pharmacy to utilize a [facility] outpatient pharmacy?   No    Assessment: 1. 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 [name] class II symptoms. - Agree with starting furosemide 40 mg PO daily today. KCl 40 mEq x 1 given for replacement. Starting KCl 20 mEq daily. Keep K>4 and Mg>2. - Continue metoprolol XL 200 mg daily - States she has diffuse body rash  and LE edema with olmesartan and palpitations with valsartan. Holding off on trialing Entresto at this time. - Agree with starting spironolactone 12.5 mg daily - Continue Farxiga 10 mg daily   Plan: 1) Medication changes recommended at this time: - Agree with changes  2) Patient assistance: - Farxiga/Jardiance copay $45  3)  Education  - Patient has been educated on current HF medications and potential additions to HF medication regimen - Patient verbalizes understanding that over the next few months, these medication doses may change and more medications may be added to optimize HF regimen - Patient has been educated on basic disease state pathophysiology and goals of therapy   Sharen Hones, PharmD, BCPS Heart Failure Stewardship Pharmacist Phone [phone]

## 2022-09-09 NOTE — Progress Notes (Signed)
Heart Failure Nurse Navigator Progress Note  PCP: Gweneth Dimitri, MD PCP-Cardiologist: Dulce Sellar Admission Diagnosis: Acute respiratory failure with hypoxia, Hypervolemia, elevated troponin.  Admitted from: Home   Presentation:   Jasmine Swanson presented with nausea and vomiting for several weeks, anxiety, elevated blood pressures. BP 136/77, HR 110, BNP 446, Troponin 695, IV lasix given. Placed on BiPAP after given fluids and became short of breath, CXR showed pulmonary edema, ECHO 4/14 LVEF 30-35%, possible LAD disease vs Takotsubo cardiomyopathy.   Patient and her daughter were educated on the sign and symptoms of heart failure, daily weights, when to call her doctor or go to the ED, Diet/ fluid restrictions, taking all medications as prescribed and attending all medical appointments. Patient verbalized her understanding of education. A HF TOC appointment 09/24/2022 @ 3 pm.   ECHO/ LVEF: 30-35%   Clinical Course:  Past Medical History:  Diagnosis Date   Anxiety    Arthritis    Bundle branch block, left    Cancer    Complication of anesthesia    laryngeal spams 50 yrs ago   Depression    Dysphagia    Essential hypertension 06/17/2013   GERD (gastroesophageal reflux disease)    Hyperlipidemia    Kyphosis    PAC (premature atrial contraction) 06/17/2013   PAT (paroxysmal atrial tachycardia) 06/17/2013   Plantar fasciitis of right foot    Preop cardiovascular exam 10/16/2017   Primary localized osteoarthrosis of right shoulder 11/10/2017   Right shoulder pain 01/20/2012     Social History   Socioeconomic History   Marital status: Single    Spouse name: Not on file   Number of children: 1   Years of education: Not on file   Highest education level: Not on file  Occupational History   Not on file  Tobacco Use   Smoking status: Former    Packs/day: 0.50    Years: 15.00    Additional pack years: 0.00    Total pack years: 7.50    Types: Cigarettes   Smokeless tobacco:  Never  Vaping Use   Vaping Use: Never used  Substance and Sexual Activity   Alcohol use: Yes    Comment: occ   Drug use: No   Sexual activity: Not Currently  Other Topics Concern   Not on file  Social History Narrative   Not on file   Social Determinants of Health   Financial Resource Strain: Not on file  Food Insecurity: No Food Insecurity (09/06/2022)   Hunger Vital Sign    Worried About Running Out of Food in the Last Year: Never true    Ran Out of Food in the Last Year: Never true  Transportation Needs: No Transportation Needs (09/06/2022)   PRAPARE - Administrator, Civil Service (Medical): No    Lack of Transportation (Non-Medical): No  Physical Activity: Not on file  Stress: Not on file  Social Connections: Not on file   Education Assessment and Provision:  Detailed education and instructions provided on heart failure disease management including the following:  Signs and symptoms of Heart Failure When to call the physician Importance of daily weights Low sodium diet Fluid restriction Medication management Anticipated future follow-up appointments  Patient education given on each of the above topics.  Patient acknowledges understanding via teach back method and acceptance of all instructions.  Education Materials:  "Living Better With Heart Failure" Booklet, HF zone tool, & Daily Weight Tracker Tool.  Patient has scale at home: yes  Patient has pill box at home: yes    High Risk Criteria for Readmission and/or Poor Patient Outcomes: Heart failure hospital admissions (last 6 months): 0  No Show rate: 1 %  Difficult social situation: No Demonstrates medication adherence: Yes Primary Language: English Literacy level: Reading, writing, and comprehension  Barriers of Care:   Diet/ fluids/ daily weights New HF continued Education  Considerations/Referrals:   Referral made to Heart Failure Pharmacist Stewardship: Yes Referral made to Heart Failure  CSW/NCM TOC: No Referral made to Heart & Vascular TOC clinic: Yes, 09/24/2022 @ 3 pm.   Items for Follow-up on DC/TOC: Diet/ fluids/ daily weights New HF continued Education   [employer], Scientist, research (physical sciences), Scientist, clinical (histocompatibility and immunogenetics) Only

## 2022-09-09 NOTE — Discharge Instructions (Signed)
Have blood drawn next week at [facility] office for Dr. Dulce Sellar I have sent in order for BMP is any questions.

## 2022-09-09 NOTE — Progress Notes (Signed)
Progress Note  Patient Name: Jasmine Swanson Date of Encounter: 09/09/2022  [facility] Cardiologist: Norman Herrlich, MD   Patient Profile  Cardiac Studies & Procedures   CARDIAC CATHETERIZATION  CARDIAC CATHETERIZATION 09/08/2022  Narrative Conclusions: No angiographically significant coronary artery disease. Mildly elevated left ventricular filling pressure (LVEDP ~20 mmHg).  Recommendations: Continue escalation of goal-directed medical therapy for nonischemic cardiomyopathy. Consider gentle diuresis as renal function allows. Primary prevention of coronary artery disease.  Yvonne Kendall, MD [facility]  Findings Coronary Findings Diagnostic  Dominance: Right  Left Main Vessel is large. Vessel is angiographically normal.  Left Anterior Descending Vessel is moderate in size.  First Diagonal Branch Vessel is small in size.  Second Diagonal Branch Vessel is small in size.  Left Circumflex Vessel is large. Vessel is angiographically normal.  First Obtuse Marginal Branch Vessel is moderate in size.  Second Obtuse Marginal Branch Vessel is small in size.  Third Obtuse Marginal Branch Vessel is large in size.  Right Coronary Artery Vessel is moderate in size.  Right Posterior Descending Artery Vessel is moderate in size.  Right Posterior Atrioventricular Artery Vessel is moderate in size.  First Right Posterolateral Branch Vessel is small in size.  Second Right Posterolateral Branch Vessel is small in size.  Third Right Posterolateral Branch Vessel is small in size.  Intervention  No interventions have been documented.    ECHOCARDIOGRAM  ECHOCARDIOGRAM COMPLETE 09/07/2022  Narrative ECHOCARDIOGRAM REPORT    Patient Name:   Jasmine Swanson [facility] Date of Exam: 09/07/2022 Medical Rec #:  [id number]         Height:       63.0 in Accession #:    [id number]        Weight:       120.4 lb Date of Birth:  Mar 30, 1941         BSA:           1.558 m Patient Age:    82 years          BP:           144/73 mmHg Patient Gender: F                 HR:           90 bpm. Exam Location:  Inpatient  Procedure: 2D Echo, Cardiac Doppler, Color Doppler and Strain Analysis  Indications:    Acute pulmonary edema  History:        Patient has no prior history of Echocardiogram examinations. Arrythmias:Tachycardia; Risk Factors:Hypertension and Dyslipidemia. Breast Cancer of R breast.  Sonographer:    Lucendia Herrlich Referring Phys: [id number] ERIC CHEN  IMPRESSIONS   1. Left ventricular ejection fraction, by estimation, is 30 to 35%. The left ventricle has moderately decreased function. The left ventricle demonstrates regional wall motion abnormalities (see scoring diagram/findings for description). The mid-to-apical inferoseptal, mid-to-apical anteroseptal, mid-to-apical inferior, apical anterior, apical lateral and apex are severely hypokinetic. Findings concerning for possible LAD disease vs takostubo cardiomyopathy. There is mild concentric left ventricular hypertrophy. Indeterminate diastolic filling due to E-A fusion. 2. Right ventricular systolic function is normal. The right ventricular size is normal. 3. Left atrial size was mildly dilated. 4. The mitral valve is grossly normal. Trivial mitral valve regurgitation. No evidence of mitral stenosis. 5. The aortic valve is tricuspid. There is mild calcification of the aortic valve. There is mild thickening of the aortic valve. Aortic valve regurgitation is not visualized. Aortic valve sclerosis/calcification is present,  without any evidence of aortic stenosis. 6. The inferior vena cava is dilated in size with >50% respiratory variability, suggesting right atrial pressure of 8 mmHg.  FINDINGS Left Ventricle: The mid-to-apical inferoseptum, mid-to-apical anteroseptal, mid-to-apical inferior, apical anterior, apical lateral and the LV apex are severely hypokinetic. Left ventricular ejection  fraction, by estimation, is 30 to 35%. The left ventricle has moderately decreased function. The left ventricle demonstrates regional wall motion abnormalities. Global longitudinal strain performed but not reported based on interpreter judgement due to suboptimal tracking. The left ventricular internal cavity size was normal in size. There is mild concentric left ventricular hypertrophy. Indeterminate diastolic filling due to E-A fusion.  Right Ventricle: The right ventricular size is normal. No increase in right ventricular wall thickness. Right ventricular systolic function is normal.  Left Atrium: Left atrial size was mildly dilated.  Right Atrium: Right atrial size was normal in size.  Pericardium: There is no evidence of pericardial effusion.  Mitral Valve: The mitral valve is grossly normal. Trivial mitral valve regurgitation. No evidence of mitral valve stenosis.  Tricuspid Valve: The tricuspid valve is normal in structure. Tricuspid valve regurgitation is not demonstrated.  Aortic Valve: The aortic valve is tricuspid. There is mild calcification of the aortic valve. There is mild thickening of the aortic valve. Aortic valve regurgitation is not visualized. Aortic valve sclerosis/calcification is present, without any evidence of aortic stenosis. Aortic valve peak gradient measures 6.4 mmHg.  Pulmonic Valve: The pulmonic valve was normal in structure. Pulmonic valve regurgitation is not visualized.  Aorta: The aortic root and ascending aorta are structurally normal, with no evidence of dilitation.  Venous: The inferior vena cava is dilated in size with greater than 50% respiratory variability, suggesting right atrial pressure of 8 mmHg.  IAS/Shunts: The atrial septum is grossly normal.   LEFT VENTRICLE PLAX 2D LVIDd:         4.30 cm     Diastology LVIDs:         3.50 cm     LV e' medial:    8.38 cm/s LV PW:         1.20 cm     LV E/e' medial:  13.4 LV IVS:        1.20 cm     LV  e' lateral:   7.89 cm/s LVOT diam:     1.90 cm     LV E/e' lateral: 14.2 LV SV:         43 LV SV Index:   28 LVOT Area:     2.84 cm  LV Volumes (MOD) LV vol d, MOD A2C: 73.8 ml LV vol d, MOD A4C: 65.7 ml LV vol s, MOD A2C: 43.6 ml LV vol s, MOD A4C: 42.2 ml LV SV MOD A2C:     30.2 ml LV SV MOD A4C:     65.7 ml LV SV MOD BP:      26.0 ml  RIGHT VENTRICLE            IVC RV S prime:     7.77 cm/s  IVC diam: 2.00 cm TAPSE (M-mode): 1.4 cm  LEFT ATRIUM             Index        RIGHT ATRIUM          Index LA diam:        2.90 cm 1.86 cm/m   RA Area:     9.51 cm LA Vol (A2C):   57.5 ml  36.89 ml/m  RA Volume:   18.20 ml 11.68 ml/m LA Vol (A4C):   48.9 ml 31.38 ml/m LA Biplane Vol: 54.9 ml 35.23 ml/m AORTIC VALVE AV Area (Vmax): 2.00 cm AV Vmax:        126.00 cm/s AV Peak Grad:   6.4 mmHg LVOT Vmax:      88.97 cm/s LVOT Vmean:     56.267 cm/s LVOT VTI:       0.151 m  AORTA Ao Root diam: 2.90 cm Ao Asc diam:  2.70 cm  MITRAL VALVE MV Area (PHT): 5.09 cm     SHUNTS MV Decel Time: 149 msec     Systemic VTI:  0.15 m MV E velocity: 112.00 cm/s  Systemic Diam: 1.90 cm  Laurance Flatten MD Electronically signed by Laurance Flatten MD Signature Date/Time: 09/07/2022/3:00:05 PM    Final             Subjective   Has any chest pain or shortness of breath today.  Cardiac cath done yesterday due to new LV dysfunction with EF 30 to 35%.  Cath revealed normal coronary arteries with no CAD mildly elevated LVEDP at 18 mmHg.  Findings consistent with stress cardiomyopathy. Inpatient Medications    Scheduled Meds:  aspirin EC  81 mg Oral Daily   dapagliflozin propanediol  10 mg Oral Daily   enoxaparin (LOVENOX) injection  40 mg Subcutaneous Q24H   folic acid  1 mg Oral Daily   metoprolol  200 mg Oral QPM   multivitamin with minerals  1 tablet Oral Daily   pantoprazole (PROTONIX) IV  40 mg Intravenous Q12H   rosuvastatin  20 mg Oral QPM   sodium chloride flush  3 mL  Intravenous Q12H   sodium chloride flush  3 mL Intravenous Q12H   thiamine  100 mg Oral Daily   Or   thiamine  100 mg Intravenous Daily   Continuous Infusions:  sodium chloride     PRN Meds: sodium chloride, acetaminophen **OR** acetaminophen, LORazepam **OR** LORazepam, melatonin, ondansetron **OR** ondansetron (ZOFRAN) IV, sodium chloride flush   Vital Signs    Vitals:   09/08/22 2002 09/09/22 0041 09/09/22 0400 09/09/22 0836  BP: (!) 127/45 (!) 90/39 (!) 117/45 135/70  Pulse: 78 65 64 64  Resp: 17 15 13 16   Temp: 98.1 F (36.7 C) 97.7 F (36.5 C) 97.7 F (36.5 C) 98 F (36.7 C)  TempSrc: Oral Oral Oral Oral  SpO2: 98% 98% 98% 95%  Weight:   55.7 kg   Height:        Intake/Output Summary (Last 24 hours) at 09/09/2022 9983 Last data filed at 09/08/2022 2006 Gross per 24 hour  Intake 240 ml  Output 1325 ml  Net -1085 ml       09/09/2022    4:00 AM 09/08/2022    4:03 AM 09/07/2022    4:51 AM  Last 3 Weights  Weight (lbs) 122 lb 12.7 oz 120 lb 2.4 oz 120 lb 6.4 oz  Weight (kg) 55.7 kg 54.5 kg 54.613 kg      Telemetry    Normal sinus rhythm personally Reviewed  ECG    No EKG to review- Personally Reviewed  Physical Exam   GEN: Well nourished, well developed in no acute distress HEENT: Normal NECK: No JVD; No carotid bruits LYMPHATICS: No lymphadenopathy CARDIAC:RRR, no murmurs, rubs, gallops RESPIRATORY:  Clear to auscultation without rales, wheezing or rhonchi  ABDOMEN: Soft, non-tender, non-distended MUSCULOSKELETAL:  No edema; No deformity  SKIN: Warm and dry NEUROLOGIC:  Alert and oriented x 3 PSYCHIATRIC:  Normal affect  Labs    High Sensitivity Troponin:   Recent Labs  Lab 09/06/22 1736 09/06/22 2007 09/06/22 2138 09/07/22 0209 09/07/22 0537  TROPONINIHS 248* 476* 558* 867* 695*       Chemistry Recent Labs  Lab 09/06/22 1523 09/06/22 1807 09/07/22 0102 09/08/22 0246 09/09/22 0105  NA 137   < > 135 134* 134*  K 3.6   < > 3.6  3.3* 3.4*  CL 105  --  102 98 100  CO2 20*  --  23 26 25   GLUCOSE 117*  --  122* 131* 115*  BUN 11  --  10 13 13   CREATININE 0.79  --  0.91 0.87 1.00  CALCIUM 8.5*  --  8.2* 8.6* 8.6*  PROT 7.1  --  6.3*  --   --   ALBUMIN 4.1  --  3.6  --   --   AST 30  --  30  --   --   ALT 21  --  21  --   --   ALKPHOS 45  --  40  --   --   BILITOT 1.0  --  1.1  --   --   GFRNONAA >60  --  >60 >60 56*  ANIONGAP 12  --  10 10 9    < > = values in this interval not displayed.      Hematology Recent Labs  Lab 09/07/22 0102 09/08/22 0246 09/09/22 0105  WBC 7.1 6.4 6.6  RBC 3.72* 3.64* 3.61*  HGB 12.1 11.7* 11.7*  HCT 34.8* 33.9* 35.9*  MCV 93.5 93.1 99.4  MCH 32.5 32.1 32.4  MCHC 34.8 34.5 32.6  RDW 12.4 12.5 12.4  PLT 172 160 149*     BNP Recent Labs  Lab 09/06/22 1523  BNP 446.7*      DDimer No results for input(s): "DDIMER" in the last 168 hours.    Radiology    CARDIAC CATHETERIZATION  Result Date: 09/08/2022 Conclusions: No angiographically significant coronary artery disease. Mildly elevated left ventricular filling pressure (LVEDP ~20 mmHg). Recommendations: Continue escalation of goal-directed medical therapy for nonischemic cardiomyopathy. Consider gentle diuresis as renal function allows. Primary prevention of coronary artery disease. Yvonne Kendall, MD [facility]  ECHOCARDIOGRAM COMPLETE  Result Date: 09/07/2022    ECHOCARDIOGRAM REPORT   Patient Name:   Jasmine Swanson [facility] Date of Exam: 09/07/2022 Medical Rec #:  [id number]         Height:       63.0 in Accession #:    [id number]        Weight:       120.4 lb Date of Birth:  06-04-1940         BSA:          1.558 m Patient Age:    82 years          BP:           144/73 mmHg Patient Gender: F                 HR:           90 bpm. Exam Location:  Inpatient Procedure: 2D Echo, Cardiac Doppler, Color Doppler and Strain Analysis Indications:    Acute pulmonary edema  History:        Patient has no prior history of  Echocardiogram examinations.  Arrythmias:Tachycardia; Risk Factors:Hypertension and                 Dyslipidemia. Breast Cancer of R breast.  Sonographer:    Lucendia Herrlich Referring Phys: [id number] ERIC CHEN IMPRESSIONS  1. Left ventricular ejection fraction, by estimation, is 30 to 35%. The left ventricle has moderately decreased function. The left ventricle demonstrates regional wall motion abnormalities (see scoring diagram/findings for description). The mid-to-apical inferoseptal, mid-to-apical anteroseptal, mid-to-apical inferior, apical anterior, apical lateral and apex are severely hypokinetic. Findings concerning for possible LAD disease vs takostubo cardiomyopathy. There is mild concentric left ventricular hypertrophy. Indeterminate diastolic filling due to E-A fusion.  2. Right ventricular systolic function is normal. The right ventricular size is normal.  3. Left atrial size was mildly dilated.  4. The mitral valve is grossly normal. Trivial mitral valve regurgitation. No evidence of mitral stenosis.  5. The aortic valve is tricuspid. There is mild calcification of the aortic valve. There is mild thickening of the aortic valve. Aortic valve regurgitation is not visualized. Aortic valve sclerosis/calcification is present, without any evidence of aortic stenosis.  6. The inferior vena cava is dilated in size with >50% respiratory variability, suggesting right atrial pressure of 8 mmHg. FINDINGS  Left Ventricle: The mid-to-apical inferoseptum, mid-to-apical anteroseptal, mid-to-apical inferior, apical anterior, apical lateral and the LV apex are severely hypokinetic. Left ventricular ejection fraction, by estimation, is 30 to 35%. The left ventricle has moderately decreased function. The left ventricle demonstrates regional wall motion abnormalities. Global longitudinal strain performed but not reported based on interpreter judgement due to suboptimal tracking. The left ventricular internal  cavity size was normal in size. There is mild concentric left ventricular hypertrophy. Indeterminate diastolic filling due to E-A fusion. Right Ventricle: The right ventricular size is normal. No increase in right ventricular wall thickness. Right ventricular systolic function is normal. Left Atrium: Left atrial size was mildly dilated. Right Atrium: Right atrial size was normal in size. Pericardium: There is no evidence of pericardial effusion. Mitral Valve: The mitral valve is grossly normal. Trivial mitral valve regurgitation. No evidence of mitral valve stenosis. Tricuspid Valve: The tricuspid valve is normal in structure. Tricuspid valve regurgitation is not demonstrated. Aortic Valve: The aortic valve is tricuspid. There is mild calcification of the aortic valve. There is mild thickening of the aortic valve. Aortic valve regurgitation is not visualized. Aortic valve sclerosis/calcification is present, without any evidence of aortic stenosis. Aortic valve peak gradient measures 6.4 mmHg. Pulmonic Valve: The pulmonic valve was normal in structure. Pulmonic valve regurgitation is not visualized. Aorta: The aortic root and ascending aorta are structurally normal, with no evidence of dilitation. Venous: The inferior vena cava is dilated in size with greater than 50% respiratory variability, suggesting right atrial pressure of 8 mmHg. IAS/Shunts: The atrial septum is grossly normal.  LEFT VENTRICLE PLAX 2D LVIDd:         4.30 cm     Diastology LVIDs:         3.50 cm     LV e' medial:    8.38 cm/s LV PW:         1.20 cm     LV E/e' medial:  13.4 LV IVS:        1.20 cm     LV e' lateral:   7.89 cm/s LVOT diam:     1.90 cm     LV E/e' lateral: 14.2 LV SV:         43 LV SV Index:  28 LVOT Area:     2.84 cm  LV Volumes (MOD) LV vol d, MOD A2C: 73.8 ml LV vol d, MOD A4C: 65.7 ml LV vol s, MOD A2C: 43.6 ml LV vol s, MOD A4C: 42.2 ml LV SV MOD A2C:     30.2 ml LV SV MOD A4C:     65.7 ml LV SV MOD BP:      26.0 ml RIGHT  VENTRICLE            IVC RV S prime:     7.77 cm/s  IVC diam: 2.00 cm TAPSE (M-mode): 1.4 cm LEFT ATRIUM             Index        RIGHT ATRIUM          Index LA diam:        2.90 cm 1.86 cm/m   RA Area:     9.51 cm LA Vol (A2C):   57.5 ml 36.89 ml/m  RA Volume:   18.20 ml 11.68 ml/m LA Vol (A4C):   48.9 ml 31.38 ml/m LA Biplane Vol: 54.9 ml 35.23 ml/m  AORTIC VALVE AV Area (Vmax): 2.00 cm AV Vmax:        126.00 cm/s AV Peak Grad:   6.4 mmHg LVOT Vmax:      88.97 cm/s LVOT Vmean:     56.267 cm/s LVOT VTI:       0.151 m  AORTA Ao Root diam: 2.90 cm Ao Asc diam:  2.70 cm MITRAL VALVE MV Area (PHT): 5.09 cm     SHUNTS MV Decel Time: 149 msec     Systemic VTI:  0.15 m MV E velocity: 112.00 cm/s  Systemic Diam: 1.90 cm Laurance Flatten MD Electronically signed by Laurance Flatten MD Signature Date/Time: 09/07/2022/3:00:05 PM    Final     Patient Profile     82 y.o. female a hx of HTN, HLD, LBBB on EKG, history of PAT, history of breast cancer, anxiety, depression who is being seen 09/07/2022 for the evaluation of elevated troponin at the request of Dr. Jacqulyn Bath.   Assessment & Plan    Elevated Troponin  Flash Pulmonary Edema  Takotsubo cardiomyopathy - Patient presented to the ED complaining of nausea and decreased oral intake for the past month. Had been having increased weakness and weight loss over the past month as well - After receiving IV fluids in the ED, she developed acute onset shortness of breath and a wet cough. Initially required Bi-PAP and became tachycardic with HR to the 120s  - hsTn 31>248>476>558>867>695  - Patient received IV lasix 120 mg of IV Lasix initially good urine output - Now on PO lasix 40 mg daily - She put out 1.33 L yesterday and is net - 2.6 L since admission - Echo showed moderate LV dysfunction with EF 30 to 35% with mid to apical inferior/inferior septal/anterior septal, apical anterior and lateral hypokinesis and hypokinesis of the apex. - Did not have  significant shortness of breath or chest pain prior to admission  -  Left heart cath yesterday showed normal coronary arteries.  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 - Continue Farxiga  daily, Lasix 40 mg daily and Toprol XL 200 mg daily - Serum creatinine stable today at 1 post cath so will add spironolactone 12.5 mg daily - Bmet in 1 week -I do not see any indication for aspirin therapy at this time.  She was on this when she came in so we will defer to [facility] -Commend repeat echo in 2 months to make sure LV function has normalized   Tremors  Nausea  - Patient complained that she had full body tremors in the ED  Symptoms improved with ativan  - In the ED, family expressed concern that patient drank alcohol heavily. Patient reports only drinking 1-2 glasses of wine per night  - On CIWA protocol per primary. She does have some symptoms of withdrawal (nausea, tremors, anxiety, tachycardia). - Management per primary   HTN  -BP is controlled on exam and at times elevated -Continue Toprol-XL 200 mg daily   -Start spironolactone 12.5 mg daily  Hypokalemia -Potassium 3.4 today -Replete this morning. -Magnesium normal at 2.2 after mag sulfate IV -Starting spironolactone which will help -Place on K-Dur 20 mEq daily -Repeat bmet in 1 week  [facility] will sign off.   Medication Recommendations: Farxiga 10 mg daily, Toprol-XL 200 mg daily, Lasix 40 mg daily, Crestor 20 mg daily, spironolactone 12.5 mg daily and K-Dur 20 mEq daily Other recommendations (labs, testing, etc): Be met in 1 week Follow up as an  outpatient: Dr. Dulce Sellar in 2 weeks  I have spent a total of 30 minutes with patient reviewing 2D echo , telemetry, EKGs, labs and examining patient as well as establishing an assessment and plan that was discussed with the patient.  > 50% of time was spent in direct patient care.       For questions or updates, please contact [facility] Please consult [email or web address] for contact info under        Signed, Armanda Magic, MD  09/09/2022, 9:09 AM

## 2022-09-09 NOTE — TOC Transition Note (Signed)
Transition of Care [facility]) - CM/SW Discharge Note   Patient Details  Name: Jasmine Swanson MRN: [id number] Date of Birth: 25-Apr-1941  [facility] CM/SW Contact:  Leone Haven, RN Phone Number: 09/09/2022, 10:43 AM   Clinical Narrative:    Patient is for dc today, per pt/ot eval no follow up is needed.  Daughter is at the bedside to transport patient home. She has no other needs.         Patient Goals and CMS Choice      Discharge Placement                         Discharge Plan and Services Additional resources added to the After Visit Summary for                                       Social Determinants of Health (SDOH) Interventions SDOH Screenings   Food Insecurity: No Food Insecurity (09/06/2022)  Housing: Low Risk  (09/06/2022)  Transportation Needs: No Transportation Needs (09/06/2022)  Utilities: Not At Risk (09/06/2022)  Tobacco Use: Medium Risk (09/08/2022)     Readmission Risk Interventions     No data to display

## 2022-09-09 NOTE — Discharge Summary (Addendum)
Physician Discharge Summary  NORELLE RUNNION FAO:[id number] DOB: 1941/05/06 DOA: 09/06/2022  PCP: Gweneth Dimitri, MD  Admit date: 09/06/2022 Discharge date: 09/09/2022 30 Day Unplanned Readmission Risk Score    Flowsheet Row ED to Hosp-Admission (Current) from 09/06/2022 in [facility] 3E HF PCU  30 Day Unplanned Readmission Risk Score (%) 18.99 Filed at 09/09/2022 0801       This score is the patient's risk of an unplanned readmission within 30 days of being discharged (0 -100%). The score is based on dignosis, age, lab data, medications, orders, and past utilization.   Low:  0-14.9   Medium: 15-21.9   High: 22-29.9   Extreme: 30 and above          Admitted From: Home Disposition: Home  Recommendations for Outpatient Follow-up:  Follow up with PCP in 1-2 weeks Please obtain BMP/CBC in one week  follow-up with your primary cardiologist Dr. Dulce Sellar in 1 week Please follow up with your PCP on the following pending results: Unresulted Labs (From admission, onward)     Start     Ordered   09/15/22 0500  Creatinine, serum  (enoxaparin (LOVENOX)    CrCl >/= 30 ml/min)  Weekly,   R     Comments: while on enoxaparin therapy   Question:  Specimen collection method  Answer:  Lab=Lab collect   09/08/22 1512   09/09/22 0500  Lipoprotein A (LPA)  Tomorrow morning,   R       Question:  Specimen collection method  Answer:  Lab=Lab collect   09/08/22 1512              Home Health: None Equipment/Devices: None  Discharge Condition: Stable CODE STATUS: DNR Diet recommendation: Cardiac  Subjective: Seen and examined.  Feels well.  No complaints.  Brief/Interim Summary: 82 year old female history of hypertension, hyperlipidemia, prior history of breast cancer presented to the ER with feeling shaky at home.  She states that she woke up and felt somewhat strange.  She had some abdominal discomfort and irritable bowel like syndrome symptoms.  Denied any diarrhea, any problems  urination, fever or chills.    While in the ER and undergoing workup, she received some IV fluids.  She began feeling short of breath afterwards.  She became hypoxic.  She required BiPAP.  Chest x-ray showed pulmonary edema and she was started on IV Lasix.  She was weaned off BiPAP.  Patient does not have a history of CHF. Her initial troponin was 31.  Increased to 248 and then up to 476. EKG showed normal sinus rhythm with an IVCD.  This is different from her EKG from February 2023.  Patient denied any history of cardiac disease.  She states that she has had 2 cardiac catheterizations in the past but has not had any stents placed.  Patient was transferred to [facility], admitted under hospital service and cardiology consulted.  Acute hypoxic respiratory failure secondary to acute flash pulmonary edema/new diagnosis of acute systolic congestive heart failure/NSTEMI Cardiology consulted, their initial opinion was that her elevated troponins were secondary to demand ischemia however echo shows reduced ejection fraction of 30 to 35% with wall motion abnormality indicating NSTEMI.  Patient is asymptomatic.  She underwent cardiac catheterization was found to have clean coronaries.  Per cardiology, she has a stressed induced cardiomyopathy.  They have added new medications including Aldactone, Lasix, Marcelline Deist and she was already on Toprol-XL and aspirin which will be continued as well as Crestor.  We  are adding potassium chloride.  She has been cleared by cardiology for discharge.  She is asymptomatic.  She will follow-up with her primary cardiologist Dr. Dulce Sellar in 1 week and repeat labs.    Hyperlipidemia Continue Crestor.   Essential hypertension Blood pressure controlled.  Continue home dose of Toprol-XL.   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  No signs of withdrawal during hospitalization.  She was on CIWA protocol.  I had lengthy discussion  almost every day about abstaining from alcohol.  She states that she is motivated and would not drink again.   Nausea and weakness/ ?  Gastritis?  Esophagitis: Her heavy drink and raises concern for possible esophagitis and gastritis as the source of her symptoms.  Started on Protonix twice daily which I am going to prescribe her at discharge.  She has been advised to stay away from spicy food, excessive coffee or soda and of course of alcohol.  Hypokalemia: She will be replaced before discharge.  Mild hyponatremia: Stable.  Discharge plan was discussed with patient and/or family member and they verbalized understanding and agreed with it.  Discharge Diagnoses:  Principal Problem:   Acute respiratory failure with hypoxia Active Problems:   Acute pulmonary edema   Elevated troponin   Primary hypertension   Hyperlipidemia   DNR (do not resuscitate)   DCM (dilated cardiomyopathy)   Acute systolic CHF (congestive heart failure)   Hypokalemia   Non-ST elevation (NSTEMI) myocardial infarction    Discharge Instructions   Allergies as of 09/09/2022       Reactions   Amlodipine Swelling   Benicar Hct [olmesartan Medoxomil-hctz] Other (See Comments)   Edema of lower extremities along with diffuse body rash   Codeine Other (See Comments)   Drops blood pressure   Diovan [valsartan] Other (See Comments)   Palpitations.     Niacin And Related Other (See Comments)   Turns patient red, makes her pass out   Ropinirole Hcl Other (See Comments)        Medication List     TAKE these medications    acetaminophen 500 MG tablet Commonly known as: TYLENOL Take 500 mg by mouth every 6 (six) hours as needed for moderate pain.   aspirin EC 81 MG tablet Take 81 mg by mouth See admin instructions. Take one tablet by mouth on Monday Wednesday and Fridays only per patient   calcium carbonate 500 MG chewable tablet Commonly known as: TUMS - dosed in mg elemental calcium Chew 1 tablet by  mouth daily as needed for indigestion or heartburn.   Calcium-Vitamin D 600-400 MG-UNIT Tabs Take 1 tablet by mouth daily.   Cholecalciferol 100 MCG (4000 UT) Caps Take 4,000 Units by mouth daily.   dapagliflozin propanediol 10 MG Tabs tablet Commonly known as: FARXIGA Take 1 tablet (10 mg total) by mouth daily. Start taking on: September 10, 2022   escitalopram 10 MG tablet Commonly known as: LEXAPRO Take 10 mg by mouth every evening.   fish oil-omega-3 fatty acids 1000 MG capsule Take 1 g by mouth every other day.   Flaxseed Oil 1000 MG Caps Take 1,000 mg by mouth every other day.   furosemide 40 MG tablet Commonly known as: LASIX Take 1 tablet (40 mg total) by mouth daily.   gabapentin 100 MG capsule Commonly known as: NEURONTIN Take 1 capsule (100 mg total) by mouth 4 (four) times daily.   LORazepam 0.5 MG tablet Commonly known as:  ATIVAN Take 0.5 mg by mouth every 6 (six) hours as needed for anxiety.   Magnesium 250 MG Tabs Take 250 mg by mouth daily.   metoprolol 200 MG 24 hr tablet Commonly known as: TOPROL-XL Take 200 mg by mouth every evening.   omeprazole 40 MG capsule Commonly known as: PRILOSEC Take 1 capsule (40 mg total) by mouth 2 (two) times daily.   potassium chloride SA 20 MEQ tablet Commonly known as: KLOR-CON M Take 1 tablet (20 mEq total) by mouth daily.   pyridOXINE 100 MG tablet Commonly known as: VITAMIN B6 Take 100 mg by mouth daily.   rosuvastatin 20 MG tablet Commonly known as: CRESTOR Take 20 mg by mouth every evening.   spironolactone 25 MG tablet Commonly known as: ALDACTONE Take 0.5 tablets (12.5 mg total) by mouth daily.   vitamin C 1000 MG tablet Take 1,000 mg by mouth daily.        Follow-up Information     Baldo Daub, MD Follow up.   Specialties: Cardiology, Radiology Why: the office should call you with appt,  if you have not heard by Friday please call the office Contact information: [address] [place] [address] [phone]         [facility] Follow up on 09/16/2022.   Specialty: Cardiology Why: Have lab work done at [facility] office on 09/16/22  you may eat that morning Contact information: [address], [address] [place] [address] [phone]        Gweneth Dimitri, MD Follow up in 1 week(s).   Specialty: Family Medicine Contact information: [address] [place] [address] [phone]                Allergies  Allergen Reactions   Amlodipine Swelling   Benicar Hct [Olmesartan Medoxomil-Hctz] Other (See Comments)    Edema of lower extremities along with diffuse body rash   Codeine Other (See Comments)    Drops blood pressure   Diovan [Valsartan] Other (See Comments)    Palpitations.     Niacin And Related Other (See Comments)    Turns patient red, makes her pass out   Ropinirole Hcl Other (See Comments)    Consultations: Cardiology   Procedures/Studies: CARDIAC CATHETERIZATION  Result Date: 09/08/2022 Conclusions: No angiographically significant coronary artery disease. Mildly elevated left ventricular filling pressure (LVEDP ~20 mmHg). Recommendations: Continue escalation of goal-directed medical therapy for nonischemic cardiomyopathy. Consider gentle diuresis as renal function allows. Primary prevention of coronary artery disease. Yvonne Kendall, MD [facility]  ECHOCARDIOGRAM COMPLETE  Result Date: 09/07/2022    ECHOCARDIOGRAM REPORT   Patient Name:   ANNASTACIA DUBA [facility] Date of Exam: 09/07/2022 Medical Rec #:  [id number]         Height:       63.0 in Accession #:    [id number]        Weight:       120.4 lb Date of Birth:  1940/11/09         BSA:          1.558 m Patient Age:    81 years          BP:           144/73 mmHg Patient Gender: F                 HR:           90 bpm.  Exam Location:  Inpatient Procedure: 2D Echo, Cardiac Doppler, Color Doppler and Strain Analysis  Indications:    Acute pulmonary edema  History:        Patient has no prior history of Echocardiogram examinations.                 Arrythmias:Tachycardia; Risk Factors:Hypertension and                 Dyslipidemia. Breast Cancer of R breast.  Sonographer:    Lucendia Herrlich Referring Phys: [id number] ERIC CHEN IMPRESSIONS  1. Left ventricular ejection fraction, by estimation, is 30 to 35%. The left ventricle has moderately decreased function. The left ventricle demonstrates regional wall motion abnormalities (see scoring diagram/findings for description). The mid-to-apical inferoseptal, mid-to-apical anteroseptal, mid-to-apical inferior, apical anterior, apical lateral and apex are severely hypokinetic. Findings concerning for possible LAD disease vs takostubo cardiomyopathy. There is mild concentric left ventricular hypertrophy. Indeterminate diastolic filling due to E-A fusion.  2. Right ventricular systolic function is normal. The right ventricular size is normal.  3. Left atrial size was mildly dilated.  4. The mitral valve is grossly normal. Trivial mitral valve regurgitation. No evidence of mitral stenosis.  5. The aortic valve is tricuspid. There is mild calcification of the aortic valve. There is mild thickening of the aortic valve. Aortic valve regurgitation is not visualized. Aortic valve sclerosis/calcification is present, without any evidence of aortic stenosis.  6. The inferior vena cava is dilated in size with >50% respiratory variability, suggesting right atrial pressure of 8 mmHg. FINDINGS  Left Ventricle: The mid-to-apical inferoseptum, mid-to-apical anteroseptal, mid-to-apical inferior, apical anterior, apical lateral and the LV apex are severely hypokinetic. Left ventricular ejection fraction, by estimation, is 30 to 35%. The left ventricle has moderately decreased function. The left ventricle demonstrates regional wall motion abnormalities. Global longitudinal strain performed but not reported based  on interpreter judgement due to suboptimal tracking. The left ventricular internal cavity size was normal in size. There is mild concentric left ventricular hypertrophy. Indeterminate diastolic filling due to E-A fusion. Right Ventricle: The right ventricular size is normal. No increase in right ventricular wall thickness. Right ventricular systolic function is normal. Left Atrium: Left atrial size was mildly dilated. Right Atrium: Right atrial size was normal in size. Pericardium: There is no evidence of pericardial effusion. Mitral Valve: The mitral valve is grossly normal. Trivial mitral valve regurgitation. No evidence of mitral valve stenosis. Tricuspid Valve: The tricuspid valve is normal in structure. Tricuspid valve regurgitation is not demonstrated. Aortic Valve: The aortic valve is tricuspid. There is mild calcification of the aortic valve. There is mild thickening of the aortic valve. Aortic valve regurgitation is not visualized. Aortic valve sclerosis/calcification is present, without any evidence of aortic stenosis. Aortic valve peak gradient measures 6.4 mmHg. Pulmonic Valve: The pulmonic valve was normal in structure. Pulmonic valve regurgitation is not visualized. Aorta: The aortic root and ascending aorta are structurally normal, with no evidence of dilitation. Venous: The inferior vena cava is dilated in size with greater than 50% respiratory variability, suggesting right atrial pressure of 8 mmHg. IAS/Shunts: The atrial septum is grossly normal.  LEFT VENTRICLE PLAX 2D LVIDd:         4.30 cm     Diastology LVIDs:         3.50 cm     LV e' medial:    8.38 cm/s LV PW:         1.20 cm     LV E/e' medial:  13.4 LV IVS:        1.20 cm     LV e' lateral:   7.89 cm/s LVOT diam:     1.90 cm     LV E/e' lateral: 14.2 LV SV:         43 LV SV Index:   28 LVOT Area:     2.84 cm  LV Volumes (MOD) LV vol d, MOD A2C: 73.8 ml LV vol d, MOD A4C: 65.7 ml LV vol s, MOD A2C: 43.6 ml LV vol s, MOD A4C: 42.2 ml LV SV  MOD A2C:     30.2 ml LV SV MOD A4C:     65.7 ml LV SV MOD BP:      26.0 ml RIGHT VENTRICLE            IVC RV S prime:     7.77 cm/s  IVC diam: 2.00 cm TAPSE (M-mode): 1.4 cm LEFT ATRIUM             Index        RIGHT ATRIUM          Index LA diam:        2.90 cm 1.86 cm/m   RA Area:     9.51 cm LA Vol (A2C):   57.5 ml 36.89 ml/m  RA Volume:   18.20 ml 11.68 ml/m LA Vol (A4C):   48.9 ml 31.38 ml/m LA Biplane Vol: 54.9 ml 35.23 ml/m  AORTIC VALVE AV Area (Vmax): 2.00 cm AV Vmax:        126.00 cm/s AV Peak Grad:   6.4 mmHg LVOT Vmax:      88.97 cm/s LVOT Vmean:     56.267 cm/s LVOT VTI:       0.151 m  AORTA Ao Root diam: 2.90 cm Ao Asc diam:  2.70 cm MITRAL VALVE MV Area (PHT): 5.09 cm     SHUNTS MV Decel Time: 149 msec     Systemic VTI:  0.15 m MV E velocity: 112.00 cm/s  Systemic Diam: 1.90 cm Laurance Flatten MD Electronically signed by Laurance Flatten MD Signature Date/Time: 09/07/2022/3:00:05 PM    Final    CT Angio Chest PE W and/or Wo Contrast  Result Date: 09/06/2022 CLINICAL DATA:  Pulmonary embolism (PE) suspected, high prob EXAM: CT ANGIOGRAPHY CHEST WITH CONTRAST TECHNIQUE: Multidetector CT imaging of the chest was performed using the standard protocol during bolus administration of intravenous contrast. Multiplanar CT image reconstructions and MIPs were obtained to evaluate the vascular anatomy. RADIATION DOSE REDUCTION: This exam was performed according to the departmental dose-optimization program which includes automated exposure control, adjustment of the mA and/or kV according to patient size and/or use of iterative reconstruction technique. CONTRAST:  75mL OMNIPAQUE IOHEXOL 350 MG/ML SOLN COMPARISON:  None Available. FINDINGS: Cardiovascular: Satisfactory opacification of the pulmonary arteries to the segmental level. No evidence of pulmonary embolism. Normal heart size. No significant pericardial effusion. The thoracic aorta is normal in caliber. Severe atherosclerotic plaque of the  thoracic aorta. No coronary artery calcifications. Mediastinum/Nodes: Upper limits of normal bilateral hilar lymph nodes likely reactive in etiology. No enlarged mediastinal or axillary lymph nodes. Thyroid gland, trachea, and esophagus demonstrate no significant findings. Lungs/Pleura: Diffuse interlobular septal wall thickening. Peribronchovascular ground-glass and consolidative airspace opacities most prominent within the lower lobes. No pulmonary nodule. No pulmonary mass. Bilateral trace to small volume pleural effusions. No pneumothorax. Upper Abdomen: No acute abnormality. Musculoskeletal: No chest wall abnormality. No suspicious lytic or blastic osseous lesions.  No acute displaced fracture. Multilevel degenerative changes of the spine. Total right shoulder arthroplasty. Review of the MIP images confirms the above findings. IMPRESSION: 1. Pulmonary edema with likely superimposed infection most prominent within the bilateral lower lobes. 2. Bilateral trace to small volume pleural effusions. 3. Upper limits of normal bilateral hilar lymph nodes likely reactive in etiology. 4.  Aortic Atherosclerosis (ICD10-I70.0). Electronically Signed   By: Tish Frederickson M.D.   On: 09/06/2022 18:22   DG Chest 1 View  Result Date: 09/06/2022 CLINICAL DATA:  HTN (hypertension) EXAM: CHEST  1 VIEW COMPARISON:  Chest x-ray November 10, 2017. FINDINGS: Small pleural effusion. Left greater than right bibasilar opacities. No visible pneumothorax. Cardiomediastinal silhouette is within normal limits. Right reverse shoulder arthroplasty. S-shaped thoracolumbar curvature. Polyarticular degenerative change. IMPRESSION: Small left pleural effusion. Left greater than right bibasilar opacities could represent atelectasis, aspiration, and/or pneumonia. Electronically Signed   By: Feliberto Harts M.D.   On: 09/06/2022 15:54     Discharge Exam: Vitals:   09/09/22 0400 09/09/22 0836  BP: (!) 117/45 (!) 145/69  Pulse: 64 64  Resp: 13  16  Temp: 97.7 F (36.5 C) 98 F (36.7 C)  SpO2: 98% 95%   Vitals:   09/08/22 2002 09/09/22 0041 09/09/22 0400 09/09/22 0836  BP: (!) 127/45 (!) 90/39 (!) 117/45 (!) 145/69  Pulse: 78 65 64 64  Resp: 17 15 13 16   Temp: 98.1 F (36.7 C) 97.7 F (36.5 C) 97.7 F (36.5 C) 98 F (36.7 C)  TempSrc: Oral Oral Oral Oral  SpO2: 98% 98% 98% 95%  Weight:   55.7 kg   Height:        General: Pt is alert, awake, not in acute distress Cardiovascular: RRR, S1/S2 +, no rubs, no gallops Respiratory: CTA bilaterally, no wheezing, no rhonchi Abdominal: Soft, NT, ND, bowel sounds + Extremities: no edema, no cyanosis    The results of significant diagnostics from this hospitalization (including imaging, microbiology, ancillary and laboratory) are listed below for reference.     Microbiology: Recent Results (from the past 240 hour(s))  Resp panel by RT-PCR (RSV, Flu A&B, Covid) Anterior Nasal Swab     Status: None   Collection Time: 09/06/22  7:33 PM   Specimen: Anterior Nasal Swab  Result Value Ref Range Status   SARS Coronavirus 2 by RT PCR NEGATIVE NEGATIVE Final    Comment: (NOTE) SARS-CoV-2 target nucleic acids are NOT DETECTED.  The SARS-CoV-2 RNA is generally detectable in upper respiratory specimens during the acute phase of infection. The lowest concentration of SARS-CoV-2 viral copies this assay can detect is 138 copies/mL. A negative result does not preclude SARS-Cov-2 infection and should not be used as the sole basis for treatment or other patient management decisions. A negative result may occur with  improper specimen collection/handling, submission of specimen other than nasopharyngeal swab, presence of viral mutation(s) within the areas targeted by this assay, and inadequate number of viral copies(<138 copies/mL). A negative result must be combined with clinical observations, patient history, and epidemiological information. The expected result is Negative.  Fact  Sheet for Patients:  BloggerCourse.com  Fact Sheet for Healthcare Providers:  SeriousBroker.it  This test is no t yet approved or cleared by the [place] FDA and  has been authorized for detection and/or diagnosis of SARS-CoV-2 by FDA under an Emergency Use Authorization (EUA). This EUA will remain  in effect (meaning this test can be used) for the duration of the COVID-19 declaration under Section  564(b)(1) of the Act, 21 U.S.C.section 360bbb-3(b)(1), unless the authorization is terminated  or revoked sooner.       Influenza A by PCR NEGATIVE NEGATIVE Final   Influenza B by PCR NEGATIVE NEGATIVE Final    Comment: (NOTE) The Xpert Xpress SARS-CoV-2/FLU/RSV plus assay is intended as an aid in the diagnosis of influenza from Nasopharyngeal swab specimens and should not be used as a sole basis for treatment. Nasal washings and aspirates are unacceptable for Xpert Xpress SARS-CoV-2/FLU/RSV testing.  Fact Sheet for Patients: BloggerCourse.com  Fact Sheet for Healthcare Providers: SeriousBroker.it  This test is not yet approved or cleared by the [place] FDA and has been authorized for detection and/or diagnosis of SARS-CoV-2 by FDA under an Emergency Use Authorization (EUA). This EUA will remain in effect (meaning this test can be used) for the duration of the COVID-19 declaration under Section 564(b)(1) of the Act, 21 U.S.C. section 360bbb-3(b)(1), unless the authorization is terminated or revoked.     Resp Syncytial Virus by PCR NEGATIVE NEGATIVE Final    Comment: (NOTE) Fact Sheet for Patients: BloggerCourse.com  Fact Sheet for Healthcare Providers: SeriousBroker.it  This test is not yet approved or cleared by the [place] FDA and has been authorized for detection and/or diagnosis of SARS-CoV-2 by FDA under an  Emergency Use Authorization (EUA). This EUA will remain in effect (meaning this test can be used) for the duration of the COVID-19 declaration under Section 564(b)(1) of the Act, 21 U.S.C. section 360bbb-3(b)(1), unless the authorization is terminated or revoked.  Performed at [facility], [address] [place], [place] [address]      Labs: BNP (last 3 results) Recent Labs    09/06/22 1523  BNP 446.7*   Basic Metabolic Panel: Recent Labs  Lab 09/06/22 1523 09/06/22 1807 09/07/22 0102 09/08/22 0246 09/09/22 0105  NA 137 138 135 134* 134*  K 3.6 3.7 3.6 3.3* 3.4*  CL 105  --  102 98 100  CO2 20*  --  23 26 25   GLUCOSE 117*  --  122* 131* 115*  BUN 11  --  10 13 13   CREATININE 0.79  --  0.91 0.87 1.00  CALCIUM 8.5*  --  8.2* 8.6* 8.6*  MG  --   --  1.6* 2.2  --    Liver Function Tests: Recent Labs  Lab 09/06/22 1523 09/07/22 0102  AST 30 30  ALT 21 21  ALKPHOS 45 40  BILITOT 1.0 1.1  PROT 7.1 6.3*  ALBUMIN 4.1 3.6   Recent Labs  Lab 09/06/22 1523  LIPASE 24   No results for input(s): "AMMONIA" in the last 168 hours. CBC: Recent Labs  Lab 09/06/22 1523 09/06/22 1807 09/07/22 0102 09/08/22 0246 09/09/22 0105  WBC 6.4  --  7.1 6.4 6.6  NEUTROABS 4.6  --  5.6 3.7 3.7  HGB 12.7 12.9 12.1 11.7* 11.7*  HCT 37.5 38.0 34.8* 33.9* 35.9*  MCV 94.2  --  93.5 93.1 99.4  PLT 159  --  172 160 149*   Cardiac Enzymes: No results for input(s): "CKTOTAL", "CKMB", "CKMBINDEX", "TROPONINI" in the last 168 hours. BNP: Invalid input(s): "POCBNP" CBG: Recent Labs  Lab 09/07/22 0145  GLUCAP 114*   D-Dimer No results for input(s): "DDIMER" in the last 72 hours. Hgb A1c No results for input(s): "HGBA1C" in the last 72 hours. Lipid Profile No results for input(s): "CHOL", "HDL", "LDLCALC", "TRIG", "CHOLHDL", "LDLDIRECT" in the last 72 hours. Thyroid function studies  No results for input(s): "TSH", "T4TOTAL", "T3FREE", "THYROIDAB" in the last 72  hours.  Invalid input(s): "FREET3" Anemia work up No results for input(s): "VITAMINB12", "FOLATE", "FERRITIN", "TIBC", "IRON", "RETICCTPCT" in the last 72 hours. Urinalysis No results found for: "COLORURINE", "APPEARANCEUR", "LABSPEC", "PHURINE", "GLUCOSEU", "HGBUR", "BILIRUBINUR", "KETONESUR", "PROTEINUR", "UROBILINOGEN", "NITRITE", "LEUKOCYTESUR" Sepsis Labs Recent Labs  Lab 09/06/22 1523 09/07/22 0102 09/08/22 0246 09/09/22 0105  WBC 6.4 7.1 6.4 6.6   Microbiology Recent Results (from the past 240 hour(s))  Resp panel by RT-PCR (RSV, Flu A&B, Covid) Anterior Nasal Swab     Status: None   Collection Time: 09/06/22  7:33 PM   Specimen: Anterior Nasal Swab  Result Value Ref Range Status   SARS Coronavirus 2 by RT PCR NEGATIVE NEGATIVE Final    Comment: (NOTE) SARS-CoV-2 target nucleic acids are NOT DETECTED.  The SARS-CoV-2 RNA is generally detectable in upper respiratory specimens during the acute phase of infection. The lowest concentration of SARS-CoV-2 viral copies this assay can detect is 138 copies/mL. A negative result does not preclude SARS-Cov-2 infection and should not be used as the sole basis for treatment or other patient management decisions. A negative result may occur with  improper specimen collection/handling, submission of specimen other than nasopharyngeal swab, presence of viral mutation(s) within the areas targeted by this assay, and inadequate number of viral copies(<138 copies/mL). A negative result must be combined with clinical observations, patient history, and epidemiological information. The expected result is Negative.  Fact Sheet for Patients:  BloggerCourse.com  Fact Sheet for Healthcare Providers:  SeriousBroker.it  This test is no t yet approved or cleared by the [place] FDA and  has been authorized for detection and/or diagnosis of SARS-CoV-2 by FDA under an Emergency Use  Authorization (EUA). This EUA will remain  in effect (meaning this test can be used) for the duration of the COVID-19 declaration under Section 564(b)(1) of the Act, 21 U.S.C.section 360bbb-3(b)(1), unless the authorization is terminated  or revoked sooner.       Influenza A by PCR NEGATIVE NEGATIVE Final   Influenza B by PCR NEGATIVE NEGATIVE Final    Comment: (NOTE) The Xpert Xpress SARS-CoV-2/FLU/RSV plus assay is intended as an aid in the diagnosis of influenza from Nasopharyngeal swab specimens and should not be used as a sole basis for treatment. Nasal washings and aspirates are unacceptable for Xpert Xpress SARS-CoV-2/FLU/RSV testing.  Fact Sheet for Patients: BloggerCourse.com  Fact Sheet for Healthcare Providers: SeriousBroker.it  This test is not yet approved or cleared by the [place] FDA and has been authorized for detection and/or diagnosis of SARS-CoV-2 by FDA under an Emergency Use Authorization (EUA). This EUA will remain in effect (meaning this test can be used) for the duration of the COVID-19 declaration under Section 564(b)(1) of the Act, 21 U.S.C. section 360bbb-3(b)(1), unless the authorization is terminated or revoked.     Resp Syncytial Virus by PCR NEGATIVE NEGATIVE Final    Comment: (NOTE) Fact Sheet for Patients: BloggerCourse.com  Fact Sheet for Healthcare Providers: SeriousBroker.it  This test is not yet approved or cleared by the [place] FDA and has been authorized for detection and/or diagnosis of SARS-CoV-2 by FDA under an Emergency Use Authorization (EUA). This EUA will remain in effect (meaning this test can be used) for the duration of the COVID-19 declaration under Section 564(b)(1) of the Act, 21 U.S.C. section 360bbb-3(b)(1), unless the authorization is terminated or revoked.  Performed at [facility], [address]  [place], [place] [address]      Time coordinating discharge: Over 30 minutes  SIGNED:   Hughie Closs, MD  [facility] 09/09/2022, 10:08 AM *Please note that this is a verbal dictation therefore any spelling or grammatical errors are due to the "Dragon Medical One" system interpretation. If 7PM-7AM, please contact night-coverage [email or web address]

## 2022-09-09 NOTE — Progress Notes (Addendum)
Occupational Therapy Treatment Patient Details Name: Jasmine Swanson MRN: [id number] DOB: 1940-11-10 Today's Date: 09/09/2022   History of present illness 82 year old female who presented 09/06/22 after feeling shaky and weak at home. Chest x-ray showed pulmonary edema, further work up revealed elevated troponin and alcohol dependence. PMHx: hypertension, hyperlipidemia, anxiety, depression, prior history of breast cancer, diplopia, etoh   OT comments  Pt progressing towards goals this session, completing UB/LB dressing with min guard A and min guard for ambulation without AD. Pt with some difficulty dual tasking, mild LOB during conversation while walking. Pt educated on precautions from heart cath, pt verbalizes understanding. Pt states her daughter will be at home with her for ~4 days at d/c to assist. Pt presenting with impairments listed below, will follow acutely. Anticipate no OT follow up needs at d/c.   Recommendations for follow up therapy are one component of a multi-disciplinary discharge planning process, led by the attending physician.  Recommendations may be updated based on patient status, additional functional criteria and insurance authorization.    Assistance Recommended at Discharge PRN  Patient can return home with the following  A little help with walking and/or transfers;A little help with bathing/dressing/bathroom;Assistance with cooking/housework;Assist for transportation;Help with stairs or ramp for entrance   Equipment Recommendations  None recommended by OT    Recommendations for Other Services PT consult    Precautions / Restrictions Precautions Precautions: Fall Restrictions Weight Bearing Restrictions: No       Mobility Bed Mobility               General bed mobility comments: OOB in chair upon arrival and departure    Transfers Overall transfer level: Needs assistance Equipment used: None Transfers: Sit to/from Stand Sit to Stand: Min  guard                 Balance Overall balance assessment: Mild deficits observed, not formally tested                                         ADL either performed or assessed with clinical judgement   ADL Overall ADL's : Needs assistance/impaired                 Upper Body Dressing : Sitting;Min guard   Lower Body Dressing: Min guard;Cueing for safety   Toilet Transfer: Min guard;Ambulation;Regular Toilet           Functional mobility during ADLs: Min guard;Rolling walker (2 wheels)      Extremity/Trunk Assessment Upper Extremity Assessment Upper Extremity Assessment: Generalized weakness   Lower Extremity Assessment Lower Extremity Assessment: Defer to PT evaluation        Vision   Vision Assessment?: No apparent visual deficits   Perception Perception Perception: Not tested   Praxis Praxis Praxis: Not tested    Cognition Arousal/Alertness: Awake/alert Behavior During Therapy: WFL for tasks assessed/performed Overall Cognitive Status: Within Functional Limits for tasks assessed                     Current Attention Level: Sustained     Safety/Judgement: Decreased awareness of deficits Awareness: Emergent Problem Solving: Slow processing, Requires verbal cues General Comments: difficulty dual tasking, mild LOB while talking        Exercises      Shoulder Instructions       General Comments VSS on RA  Pertinent Vitals/ Pain       Pain Assessment Pain Assessment: No/denies pain  Home Living                                          Prior Functioning/Environment              Frequency  Min 2X/week        Progress Toward Goals  OT Goals(current goals can now be found in the care plan section)  Progress towards OT goals: Progressing toward goals  Acute Rehab OT Goals Patient Stated Goal: to go home OT Goal Formulation: With patient Time For Goal Achievement:  09/21/22 Potential to Achieve Goals: Good ADL Goals Additional ADL Goal #1: Pt will independently complete IADLs Additional ADL Goal #2: Pt will indep complete IADL medication management task  Plan Discharge plan remains appropriate;Frequency remains appropriate    Co-evaluation                 AM-PAC OT "6 Clicks" Daily Activity     Outcome Measure   Help from another person eating meals?: None Help from another person taking care of personal grooming?: None Help from another person toileting, which includes using toliet, bedpan, or urinal?: A Little Help from another person bathing (including washing, rinsing, drying)?: A Little Help from another person to put on and taking off regular upper body clothing?: None Help from another person to put on and taking off regular lower body clothing?: A Little 6 Click Score: 21    End of Session    OT Visit Diagnosis: Unsteadiness on feet (R26.81);Other abnormalities of gait and mobility (R26.89);Muscle weakness (generalized) (M62.81)   Activity Tolerance Patient tolerated treatment well   Patient Left in chair;with call bell/phone within reach;with family/visitor present   Nurse Communication Mobility status        Time: 1012-1028 OT Time Calculation (min): 16 min  Charges: OT General Charges $OT Visit: 1 Visit OT Treatments $Self Care/Home Management : 8-22 mins  Carver Fila, OTD, OTR/L SecureChat Preferred Acute Rehab [phone]    Carver Fila Koonce 09/09/2022, 10:42 AM

## 2022-09-09 NOTE — Progress Notes (Signed)
Mobility Specialist Progress Note:   09/09/22 0945  Mobility  Activity Ambulated with assistance in hallway;Ambulated independently in hallway  Level of Assistance Standby assist, set-up cues, supervision of patient - no hands on  Assistive Device None  Distance Ambulated (ft) 500 ft  Activity Response Tolerated well  Mobility Referral Yes  $Mobility charge 1 Mobility   Pt eager for mobility session. Required no physical assistance throughout, only supervision for safety. Pt with x1 minor LOB, able to self correct. Asx throughout, pt left sitting in chair with all needs met.  Addison Lank Mobility Specialist Please contact via SecureChat or  Rehab office at [phone]

## 2022-09-10 LAB — LIPOPROTEIN A (LPA): Lipoprotein (a): 84.5 nmol/L — ABNORMAL HIGH (ref <75.0)

## 2022-09-16 ENCOUNTER — Other Ambulatory Visit: Payer: Self-pay

## 2022-09-16 DIAGNOSIS — E876 Hypokalemia: Secondary | ICD-10-CM | POA: Diagnosis not present

## 2022-09-17 DIAGNOSIS — F1020 Alcohol dependence, uncomplicated: Secondary | ICD-10-CM | POA: Diagnosis not present

## 2022-09-17 DIAGNOSIS — I5022 Chronic systolic (congestive) heart failure: Secondary | ICD-10-CM | POA: Diagnosis not present

## 2022-09-17 DIAGNOSIS — I5181 Takotsubo syndrome: Secondary | ICD-10-CM | POA: Diagnosis not present

## 2022-09-17 LAB — BASIC METABOLIC PANEL
BUN/Creatinine Ratio: 17 (ref 12–28)
BUN: 23 mg/dL (ref 8–27)
CO2: 17 mmol/L — ABNORMAL LOW (ref 20–29)
Calcium: 10 mg/dL (ref 8.7–10.3)
Chloride: 90 mmol/L — ABNORMAL LOW (ref 96–106)
Creatinine, Ser: 1.38 mg/dL — ABNORMAL HIGH (ref 0.57–1.00)
Glucose: 110 mg/dL — ABNORMAL HIGH (ref 70–99)
Potassium: 5.6 mmol/L — ABNORMAL HIGH (ref 3.5–5.2)
Sodium: 128 mmol/L — ABNORMAL LOW (ref 134–144)
eGFR: 38 mL/min/{1.73_m2} — ABNORMAL LOW (ref >59)

## 2022-09-18 ENCOUNTER — Other Ambulatory Visit: Payer: Self-pay

## 2022-09-18 ENCOUNTER — Telehealth: Payer: Self-pay

## 2022-09-18 DIAGNOSIS — G609 Hereditary and idiopathic neuropathy, unspecified: Secondary | ICD-10-CM | POA: Insufficient documentation

## 2022-09-18 DIAGNOSIS — M159 Polyosteoarthritis, unspecified: Secondary | ICD-10-CM | POA: Insufficient documentation

## 2022-09-18 DIAGNOSIS — Z853 Personal history of malignant neoplasm of breast: Secondary | ICD-10-CM | POA: Insufficient documentation

## 2022-09-18 DIAGNOSIS — M150 Primary generalized (osteo)arthritis: Secondary | ICD-10-CM

## 2022-09-18 DIAGNOSIS — E876 Hypokalemia: Secondary | ICD-10-CM

## 2022-09-18 DIAGNOSIS — M545 Low back pain, unspecified: Secondary | ICD-10-CM | POA: Insufficient documentation

## 2022-09-18 DIAGNOSIS — N951 Menopausal and female climacteric states: Secondary | ICD-10-CM

## 2022-09-18 HISTORY — DX: Hereditary and idiopathic neuropathy, unspecified: G60.9

## 2022-09-18 HISTORY — DX: Menopausal and female climacteric states: N95.1

## 2022-09-18 HISTORY — DX: Personal history of malignant neoplasm of breast: Z85.3

## 2022-09-18 HISTORY — DX: Primary generalized (osteo)arthritis: M15.0

## 2022-09-18 HISTORY — DX: Low back pain, unspecified: M54.50

## 2022-09-18 MED ORDER — SODIUM POLYSTYRENE SULFONATE PO POWD: Freq: Once | ORAL | 0 refills | Status: AC

## 2022-09-18 MED ORDER — LOKELMA 10 G PO PACK: 10.0000 g | PACK | Freq: Once | ORAL | 0 refills | Status: AC

## 2022-09-18 NOTE — Telephone Encounter (Signed)
-----   Message from Leone Brand, NP sent at 09/17/2022 12:56 PM EDT ----- Please make sure pt stops kdur today and aldactone - she will need Lokelma 10 mg today  if her pharmacy does not have then kayexelate 20 gm  and she will need repeat BP tomorrow  the 25th.  Nada Boozer, FNP-C

## 2022-09-18 NOTE — Addendum Note (Signed)
Addended by: Eleonore Chiquito on: 09/18/2022 05:02 PM   Modules accepted: Orders

## 2022-09-19 ENCOUNTER — Other Ambulatory Visit: Payer: Self-pay | Admitting: Nurse Practitioner

## 2022-09-19 ENCOUNTER — Telehealth: Payer: Self-pay | Admitting: Cardiology

## 2022-09-19 DIAGNOSIS — E876 Hypokalemia: Secondary | ICD-10-CM | POA: Diagnosis not present

## 2022-09-19 MED ORDER — SPIRONOLACTONE 25 MG PO TABS
12.5000 mg | ORAL_TABLET | Freq: Every day | ORAL | 1 refills | Status: DC
Start: 2022-09-19 — End: 2022-09-24

## 2022-09-19 NOTE — Telephone Encounter (Signed)
  Pt c/o medication issue:  1. Name of Medication:  Sodium Polystyrene Sulfonate   2. How are you currently taking this medication (dosage and times per day)? As written  3. Are you having a reaction (difficulty breathing--STAT)? No   4. What is your medication issue? Jasmine Swanson with [facility] calling would like to verify prescription

## 2022-09-19 NOTE — Progress Notes (Unsigned)
Sent spironolactone to preferred pharmacy

## 2022-09-20 LAB — BASIC METABOLIC PANEL
BUN/Creatinine Ratio: 15 (ref 12–28)
BUN: 17 mg/dL (ref 8–27)
CO2: 20 mmol/L (ref 20–29)
Calcium: 10 mg/dL (ref 8.7–10.3)
Chloride: 92 mmol/L — ABNORMAL LOW (ref 96–106)
Creatinine, Ser: 1.14 mg/dL — ABNORMAL HIGH (ref 0.57–1.00)
Glucose: 118 mg/dL — ABNORMAL HIGH (ref 70–99)
Potassium: 4.6 mmol/L (ref 3.5–5.2)
Sodium: 131 mmol/L — ABNORMAL LOW (ref 134–144)
eGFR: 48 mL/min/{1.73_m2} — ABNORMAL LOW (ref >59)

## 2022-09-22 NOTE — Telephone Encounter (Signed)
Left VM for pt to callback and advise her that RX has been cancelled and continue to hold potassium chloride and spironolactone. Will send MyChart message with information.  Pharmacy advised to cancel RX as potassium level has returned to normal.

## 2022-09-22 NOTE — Telephone Encounter (Signed)
[facility] is f/u as they did not hear anything back from their call on 4/26. Please advise.

## 2022-09-23 ENCOUNTER — Encounter: Payer: Self-pay | Admitting: Cardiology

## 2022-09-23 ENCOUNTER — Ambulatory Visit: Payer: Medicare Other | Attending: Cardiology | Admitting: Cardiology

## 2022-09-23 ENCOUNTER — Ambulatory Visit: Payer: Medicare Other | Admitting: Cardiology

## 2022-09-23 VITALS — BP 110/66 | HR 70 | Ht 62.0 in | Wt 117.0 lb

## 2022-09-23 DIAGNOSIS — Z170 Estrogen receptor positive status [ER+]: Secondary | ICD-10-CM

## 2022-09-23 DIAGNOSIS — E875 Hyperkalemia: Secondary | ICD-10-CM | POA: Diagnosis not present

## 2022-09-23 DIAGNOSIS — I447 Left bundle-branch block, unspecified: Secondary | ICD-10-CM | POA: Diagnosis not present

## 2022-09-23 DIAGNOSIS — I420 Dilated cardiomyopathy: Secondary | ICD-10-CM | POA: Diagnosis not present

## 2022-09-23 DIAGNOSIS — C50811 Malignant neoplasm of overlapping sites of right female breast: Secondary | ICD-10-CM

## 2022-09-23 DIAGNOSIS — E782 Mixed hyperlipidemia: Secondary | ICD-10-CM | POA: Diagnosis not present

## 2022-09-23 NOTE — Patient Instructions (Addendum)
Medication Instructions:  Your physician recommends that you continue on your current medications as directed. Please refer to the Current Medication list given to you today.  *If you need a refill on your cardiac medications before your next appointment, please call your pharmacy*   Lab Work: 3rd Floor  [address] BMP, Stool for Blood- today If you have labs (blood work) drawn today and your tests are completely normal, you will receive your results only by: MyChart Message (if you have MyChart) OR A paper copy in the mail If you have any lab test that is abnormal or we need to change your treatment, we will call you to review the results.   Testing/Procedures: Your physician has requested that you have an echocardiogram. Echocardiography is a painless test that uses sound waves to create images of your heart. It provides your doctor with information about the size and shape of your heart and how well your heart's chambers and valves are working. This procedure takes approximately one hour. There are no restrictions for this procedure. Please do NOT wear cologne, perfume, aftershave, or lotions (deodorant is allowed). Please arrive 15 minutes prior to your appointment time.    Follow-Up: At [facility], you and your health needs are our priority.  As part of our continuing mission to provide you with exceptional heart care, we have created designated Provider Care Teams.  These Care Teams include your primary Cardiologist (physician) and Advanced Practice Providers (APPs -  Physician Assistants and Nurse Practitioners) who all work together to provide you with the care you need, when you need it.  We recommend signing up for the patient portal called "MyChart".  Sign up information is provided on this After Visit Summary.  MyChart is used to connect with patients for Virtual Visits (Telemedicine).  Patients are able to view lab/test results, encounter notes, upcoming appointments, etc.   Non-urgent messages can be sent to your provider as well.   To learn more about what you can do with MyChart, go to [email or web address].    Your next appointment:   1 month(s)  The format for your next appointment:   In Person  Provider:   Gypsy Balsam, MD    Other Instructions NA

## 2022-09-23 NOTE — Progress Notes (Unsigned)
Cardiology Office Note:    Date:  09/23/2022   ID:  Jasmine Swanson, DOB Jun 19, 1940, MRN [id number]  PCP:  Gweneth Dimitri, MD  Cardiologist:  Gypsy Balsam, MD    Referring MD: Gweneth Dimitri, MD   Chief Complaint  Patient presents with   Weight Loss    2-3 months ago     History of Present Illness:    Jasmine Swanson is a 82 y.o. female with past medical history significant for recently recognized cardiomyopathy with ejection fraction 30 to 35%, cardiac catheterization done showing normal coronaries.  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  There was some issue also about her drinking some alcohol however she stopped already.  Additional problem include left bundle branch block, basal cell carcinoma, breast cancer.  She described the fact that for few months before she presented to the hospital she was having some dyspeptic like symptoms also weight loss lack of appetite.  Now she is gradually gaining weight back.  She is feeling good.  Denies have any chest pain tightness squeezing pressure burning chest  Past Medical History:  Diagnosis Date   Acute pulmonary edema (HCC) 09/06/2022   Acute respiratory failure with hypoxia (HCC) 09/06/2022   Acute systolic CHF (congestive heart failure) (HCC) 09/08/2022   Anxiety    Arthritis    Basal cell carcinoma 07/03/2021   Bundle branch block, left    Cancer (HCC)    Complication of anesthesia    laryngeal spams 50 yrs ago   DCM (dilated cardiomyopathy) (HCC) 09/08/2022   Depression    DNR (do not resuscitate) 09/06/2022   Dysphagia    Elevated troponin 09/06/2022   Essential hypertension 06/17/2013   Gastro-esophageal reflux disease without esophagitis 07/03/2021   Generalized anxiety disorder 07/03/2021   Genetic testing 08/02/2021   [facility] CancerNext-Expanded Panel was Negative. 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 Report date is 07/26/2021.     The  CancerNext-Expanded gene panel offered by [facility] and includes sequencing, rearrangement, and RNA analysis for the following 77 genes: AIP, ALK, APC, ATM, AXIN2, BAP1, BARD1, BLM, BMPR1A, BRCA1, BRCA2, BRIP1, CDC73, CDH1, CDK4,    GERD (gastroesophageal reflux disease)    Hereditary and idiopathic neuropathy, unspecified 09/18/2022   History of breast cancer 09/18/2022   Hyperlipidemia    Hypokalemia 09/08/2022   Kyphosis    Left bundle branch block 07/03/2021   Low back pain 09/18/2022   Malignant neoplasm of overlapping sites of right female breast (HCC) 07/01/2021   Malignant neoplasm of upper-inner quadrant of right breast in female, 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   Menopausal symptoms 09/18/2022   Non-ST elevation (NSTEMI) myocardial infarction (HCC) 09/08/2022   Osteopenia of right thigh 07/03/2021   Other chronic pain 07/03/2021   PAC (premature atrial contraction) 06/17/2013   Pain in right knee 01/01/2018   Parageusia 07/03/2021   PAT (paroxysmal atrial tachycardia) 06/17/2013   Personal history of colonic polyps 07/03/2021   Plantar fasciitis of right foot    Preop cardiovascular exam 10/16/2017   Preoperative cardiovascular examination 10/16/2017   Primary localized osteoarthrosis of right shoulder 11/10/2017   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   Restless legs syndrome 07/03/2021   Right shoulder pain 01/20/2012    Past Surgical History:  Procedure Laterality Date   ABDOMINAL HYSTERECTOMY     bladder tac     BREAST BIOPSY Right 06/25/2021   BREAST LUMPECTOMY Right 07/09/2021   BREAST LUMPECTOMY  WITH RADIOACTIVE SEED LOCALIZATION Right 07/09/2021   Procedure: RIGHT BREAST LUMPECTOMY WITH RADIOACTIVE SEED LOCALIZATION;  Surgeon: Manus Rudd, MD;  Location: [facility];  Service: General;  Laterality: Right;   CARDIAC CATHETERIZATION     LEFT HEART CATH AND CORONARY ANGIOGRAPHY N/A 09/08/2022   Procedure:  LEFT HEART CATH AND CORONARY ANGIOGRAPHY;  Surgeon: Yvonne Kendall, MD;  Location: [facility];  Service: Cardiovascular;  Laterality: N/A;   TONSILLECTOMY     TOTAL SHOULDER ARTHROPLASTY Right 11/10/2017   Procedure: TOTAL SHOULDER ARTHROPLASTY;  Surgeon: Teryl Lucy, MD;  Location: [facility] OR;  Service: Orthopedics;  Laterality: Right;    Current Medications: Current Meds  Medication Sig   acetaminophen (TYLENOL) 500 MG tablet Take 500 mg by mouth every 6 (six) hours as needed for moderate pain.   Ascorbic Acid (VITAMIN C) 1000 MG tablet Take 1,000 mg by mouth daily.   aspirin EC 81 MG tablet Take 81 mg by mouth See admin instructions. Take one tablet by mouth on Monday Wednesday and Fridays only per patient   Calcium Carb-Cholecalciferol (CALCIUM-VITAMIN D) 600-400 MG-UNIT TABS Take 1 tablet by mouth daily.   calcium carbonate (TUMS - DOSED IN MG ELEMENTAL CALCIUM) 500 MG chewable tablet Chew 1 tablet by mouth daily as needed for indigestion or heartburn.   Cholecalciferol 4000 units CAPS Take 4,000 Units by mouth daily.   dapagliflozin propanediol (FARXIGA) 10 MG TABS tablet Take 1 tablet (10 mg total) by mouth daily.   escitalopram (LEXAPRO) 10 MG tablet Take 10 mg by mouth every evening.    fish oil-omega-3 fatty acids 1000 MG capsule Take 1 g by mouth every other day.   Flaxseed, Linseed, (FLAXSEED OIL) 1000 MG CAPS Take 1,000 mg by mouth every other day.   furosemide (LASIX) 40 MG tablet Take 1 tablet (40 mg total) by mouth daily.   gabapentin (NEURONTIN) 100 MG capsule Take 1 capsule (100 mg total) by mouth 4 (four) times daily.   LORazepam (ATIVAN) 0.5 MG tablet Take 0.5 mg by mouth every 6 (six) hours as needed for anxiety.   Magnesium 250 MG TABS Take 250 mg by mouth daily.    metoprolol (TOPROL-XL) 200 MG 24 hr tablet Take 200 mg by mouth every evening.    omeprazole (PRILOSEC) 40 MG capsule Take 1 capsule (40 mg total) by mouth 2 (two) times daily.   potassium chloride  SA (KLOR-CON M) 20 MEQ tablet Take 1 tablet (20 mEq total) by mouth daily.   pyridOXINE (VITAMIN B-6) 100 MG tablet Take 100 mg by mouth daily.   rosuvastatin (CRESTOR) 20 MG tablet Take 20 mg by mouth every evening.    spironolactone (ALDACTONE) 25 MG tablet Take 0.5 tablets (12.5 mg total) by mouth daily.   [DISCONTINUED] Potassium Chloride ER 20 MEQ TBCR Take 1 tablet by mouth daily.     Allergies:   Amlodipine, Benicar hct [olmesartan medoxomil-hctz], Codeine, Diovan [valsartan], Niacin and related, and Ropinirole hcl   Social History   Socioeconomic History   Marital status: Single    Spouse name: Not on file   Number of children: 1   Years of education: Not on file   Highest education level: Not on file  Occupational History   Occupation: Retired  Tobacco Use   Smoking status: Former    Packs/day: 0.50    Years: 15.00    Additional pack years: 0.00    Total pack years: 7.50    Types: Cigarettes   Smokeless tobacco:  Never  Vaping Use   Vaping Use: Never used  Substance and Sexual Activity   Alcohol use: Yes    Comment: ? daily drinker?   Drug use: No   Sexual activity: Not Currently  Other Topics Concern   Not on file  Social History Narrative   Not on file   Social Determinants of Health   Financial Resource Strain: Low Risk  (09/09/2022)   Overall Financial Resource Strain (CARDIA)    Difficulty of Paying Living Expenses: Not hard at all  Food Insecurity: No Food Insecurity (09/06/2022)   Hunger Vital Sign    Worried About Running Out of Food in the Last Year: Never true    Ran Out of Food in the Last Year: Never true  Transportation Needs: No Transportation Needs (09/06/2022)   PRAPARE - Administrator, Civil Service (Medical): No    Lack of Transportation (Non-Medical): No  Physical Activity: Not on file  Stress: Not on file  Social Connections: Not on file     Family History: The patient's family history includes Breast cancer in her cousin;  Colon cancer in her father; Heart attack in her mother; Hyperlipidemia in her mother; Hypertension in her mother; Prostate cancer in her father. There is no history of Sudden death or Diabetes. ROS:   Please see the history of present illness.    All 14 point review of systems negative except as described per history of present illness  EKGs/Labs/Other Studies Reviewed:      Recent Labs: 09/06/2022: B Natriuretic Peptide 446.7 09/07/2022: ALT 21 09/08/2022: Magnesium 2.2 09/09/2022: Hemoglobin 11.7; Platelets 149 09/19/2022: BUN 17; Creatinine, Ser 1.14; Potassium 4.6; Sodium 131  Recent Lipid Panel No results found for: "CHOL", "TRIG", "HDL", "CHOLHDL", "VLDL", "LDLCALC", "LDLDIRECT"  Physical Exam:    VS:  BP 110/66 (BP Location: Left Arm, Patient Position: Sitting)   Pulse 70   Ht 5\' 2"  (1.575 m)   Wt 117 lb (53.1 kg)   SpO2 97%   BMI 21.40 kg/m     Wt Readings from Last 3 Encounters:  09/23/22 117 lb (53.1 kg)  09/09/22 122 lb 12.7 oz (55.7 kg)  11/27/21 132 lb 8 oz (60.1 kg)     GEN:  Well nourished, well developed in no acute distress HEENT: Normal NECK: No JVD; No carotid bruits LYMPHATICS: No lymphadenopathy CARDIAC: RRR, no murmurs, no rubs, no gallops RESPIRATORY:  Clear to auscultation without rales, wheezing or rhonchi  ABDOMEN: Soft, non-tender, non-distended MUSCULOSKELETAL:  No edema; No deformity  SKIN: Warm and dry LOWER EXTREMITIES: no swelling, wrist after cardiac catheterization healed well NEUROLOGIC:  Alert and oriented x 3 PSYCHIATRIC:  Normal affect   ASSESSMENT:    1. DCM (dilated cardiomyopathy) (HCC)   2. Left bundle branch block   3. Mixed hyperlipidemia   4. Malignant neoplasm of overlapping sites of right breast in female, estrogen receptor positive (HCC)    PLAN:    In order of problems listed above:  Dilated cardiomyopathy with ejection fraction 30 to 35%, she is not a candidate for ACE inhibitor or ARB Entresto secondary to  allergic reaction to that class of medications, she is on Toprol-XL 200 mg which I will continue, she is also on SGLT2 blocker, Aldactone has been withdrawn recently because post hyperkalemia.  Will continue that regimen.  I will repeat her echocardiogram in about 2 months to see if there is any improvement in terms of etiology of this phenomenon clearly not coronary artery  disease since cardiac catheterization was normal, stress and described to be a myopathies possibility however if there was no stressors in her life recently.  I am more about potentially having some malignancy.  Will do stool for guaiac first and then Will talk about potentially seeing gastroenterologist.  She may require to also see GYN. Left bundle branch block.  Will continue monitoring.  If there is no improvement left ventricle ejection fraction BiV pacing will be considered. Hyperkalemia.  Will check her Chem-7 today.   Medication Adjustments/Labs and Tests Ordered: Current medicines are reviewed at length with the patient today.  Concerns regarding medicines are outlined above.  No orders of the defined types were placed in this encounter.  Medication changes: No orders of the defined types were placed in this encounter.   Signed, Georgeanna Lea, MD, [facility] 09/23/2022 2:18 PM     [facility]

## 2022-09-23 NOTE — Telephone Encounter (Signed)
Pt here to see Dr. Bing Matter for appt.09-23-22 Pt stated that she did get the message and will discuss further with Dr. Bing Matter

## 2022-09-24 ENCOUNTER — Ambulatory Visit (HOSPITAL_COMMUNITY)
Admit: 2022-09-24 | Discharge: 2022-09-24 | Disposition: A | Discharge status: Home / Self Care | Payer: Medicare Other | Source: Ambulatory Visit | Attending: Adult Health | Admitting: Adult Health

## 2022-09-24 ENCOUNTER — Encounter (HOSPITAL_COMMUNITY): Payer: Self-pay

## 2022-09-24 VITALS — BP 150/84 | HR 74 | Ht 63.0 in | Wt 117.6 lb

## 2022-09-24 DIAGNOSIS — I5021 Acute systolic (congestive) heart failure: Secondary | ICD-10-CM | POA: Insufficient documentation

## 2022-09-24 DIAGNOSIS — Z7984 Long term (current) use of oral hypoglycemic drugs: Secondary | ICD-10-CM | POA: Insufficient documentation

## 2022-09-24 DIAGNOSIS — I447 Left bundle-branch block, unspecified: Secondary | ICD-10-CM | POA: Insufficient documentation

## 2022-09-24 DIAGNOSIS — I428 Other cardiomyopathies: Secondary | ICD-10-CM | POA: Insufficient documentation

## 2022-09-24 DIAGNOSIS — I252 Old myocardial infarction: Secondary | ICD-10-CM | POA: Diagnosis not present

## 2022-09-24 DIAGNOSIS — I110 Hypertensive heart disease with heart failure: Secondary | ICD-10-CM | POA: Insufficient documentation

## 2022-09-24 DIAGNOSIS — I2510 Atherosclerotic heart disease of native coronary artery without angina pectoris: Secondary | ICD-10-CM | POA: Diagnosis not present

## 2022-09-24 DIAGNOSIS — I10 Essential (primary) hypertension: Secondary | ICD-10-CM

## 2022-09-24 DIAGNOSIS — Z79899 Other long term (current) drug therapy: Secondary | ICD-10-CM | POA: Insufficient documentation

## 2022-09-24 LAB — BASIC METABOLIC PANEL
BUN/Creatinine Ratio: 15 (ref 12–28)
BUN: 20 mg/dL (ref 8–27)
CO2: 21 mmol/L (ref 20–29)
Calcium: 10 mg/dL (ref 8.7–10.3)
Chloride: 95 mmol/L — ABNORMAL LOW (ref 96–106)
Creatinine, Ser: 1.33 mg/dL — ABNORMAL HIGH (ref 0.57–1.00)
Glucose: 111 mg/dL — ABNORMAL HIGH (ref 70–99)
Potassium: 4.7 mmol/L (ref 3.5–5.2)
Sodium: 133 mmol/L — ABNORMAL LOW (ref 134–144)
eGFR: 40 mL/min/{1.73_m2} — ABNORMAL LOW (ref >59)

## 2022-09-24 MED ORDER — OMEPRAZOLE 40 MG PO CPDR
40.0000 mg | DELAYED_RELEASE_CAPSULE | Freq: Two times a day (BID) | ORAL | 11 refills | Status: DC
Start: 2022-09-24 — End: 2023-12-21

## 2022-09-24 MED ORDER — FUROSEMIDE 20 MG PO TABS
20.0000 mg | ORAL_TABLET | ORAL | 3 refills | Status: DC
Start: 2022-09-24 — End: 2023-09-17

## 2022-09-24 MED ORDER — METOPROLOL SUCCINATE ER 200 MG PO TB24
200.0000 mg | ORAL_TABLET | Freq: Every evening | ORAL | 3 refills | Status: DC
Start: 2022-09-24 — End: 2023-09-17

## 2022-09-24 MED ORDER — FUROSEMIDE 40 MG PO TABS
20.0000 mg | ORAL_TABLET | ORAL | 3 refills | Status: DC
Start: 2022-09-24 — End: 2022-09-24

## 2022-09-24 NOTE — Patient Instructions (Signed)
Decrease Lasix to 20 mg on Mon/Wed/Fri. Follow up with your regular Cardiologist. Call us at [phone] if any questions or concerns.

## 2022-09-24 NOTE — Progress Notes (Signed)
ReDS Vest / Clip - 09/24/22 1500       ReDS Vest / Clip   Station Marker A    Ruler Value 25    ReDS Value Range Low volume    ReDS Actual Value 32

## 2022-09-24 NOTE — Progress Notes (Signed)
HEART & VASCULAR TRANSITION OF CARE CONSULT NOTE     Referring Physician: Primary Care: Dr Corliss Blacker  Primary Cardiologist: Dr Bing Matter  HPI: Referred to clinic by Dr Ella Jubilee for heart failure consultation.   Mrs Jasmine Swanson is a 82 year old retired Engineer, civil (consulting) with a history of PAT, HLD, LBBB, CAD,  breast cancer, NICM, and recently diagnosed HFrEF.   Presented to [facility] 09/06/22 with fatigue and weight loss. Denies recent stress but misses working as a Engineer, civil (consulting). Given IV fluid and then developed flash pulmonary edema. HS Trop 31> [id number]. Admitted with Acute HFrEF/NSTEMI. Diuresed with IV lasix. Newly reduced EF 30-35% with WMA  possible Takostubo. Cath with no significant CAD and LVEDP 20. Started GDMT.   She saw Dr Bing Matter yesterday. K 4.7 and creatinine 1.3. Spiro and potassium stopped.   Overall feeling fine. Denies SOB/PND/Orthopnea.  Occasionally dizzy. SBP at home 110-120. She tells me she has white coat syndrome. Walking in her home without difficulty. Appetite has not been great. She eats small meals.  No fever or chills. Weight at home stable. Taking all medications.  Cardiac Testing  Echo 09/07/22  1. Left ventricular ejection fraction, by estimation, is 30 to 35%. The  left ventricle has moderately decreased function. The left ventricle  demonstrates regional wall motion abnormalities (see scoring  diagram/findings for description). The  mid-to-apical inferoseptal, mid-to-apical anteroseptal, mid-to-apical  inferior, apical anterior, apical lateral and apex are severely  hypokinetic. Findings concerning for possible LAD disease vs takostubo  cardiomyopathy. There is mild concentric left  ventricular hypertrophy. Indeterminate diastolic filling due to E-A  fusion.   2. Right ventricular systolic function is normal. The right ventricular  size is normal.   3. Left atrial size was mildly dilated.   4. The mitral valve is grossly normal. Trivial mitral valve   regurgitation. No evidence of mitral stenosis.   5. The aortic valve is tricuspid. There is mild calcification of the  aortic valve. There is mild thickening of the aortic valve. Aortic valve  regurgitation is not visualized. Aortic valve sclerosis/calcification is  present, without any evidence of  aortic stenosis.   LHC 09/08/22  No angiographically significant coronary artery disease. Mildly elevated left ventricular filling pressure (LVEDP ~20 mmHg).  Review of Systems: [y] = yes, [ ]  = no   General: Weight gain [ ] ; Weight loss [ ] ; Anorexia [ ] ; Fatigue [ ] ; Fever [ ] ; Chills [ ] ; Weakness [ ]   Cardiac: Chest pain/pressure [ ] ; Resting SOB [ ] ; Exertional SOB [ ] ; Orthopnea [ ] ; Pedal Edema [ ] ; Palpitations [ ] ; Syncope [ ] ; Presyncope [ ] ; Paroxysmal nocturnal dyspnea[ ]   Pulmonary: Cough [ ] ; Wheezing[ ] ; Hemoptysis[ ] ; Sputum [ ] ; Snoring [ ]   GI: Vomiting[ ] ; Dysphagia[ ] ; Melena[ ] ; Hematochezia [ ] ; Heartburn[ ] ; Abdominal pain [ ] ; Constipation [ ] ; Diarrhea [ ] ; BRBPR [ ]   GU: Hematuria[ ] ; Dysuria [ ] ; Nocturia[ ]   Vascular: Pain in legs with walking [ ] ; Pain in feet with lying flat [ ] ; Non-healing sores [ ] ; Stroke [ ] ; TIA [ ] ; Slurred speech [ ] ;  Neuro: Headaches[ ] ; Vertigo[ ] ; Seizures[ ] ; Paresthesias[ ] ;Blurred vision [ ] ; Diplopia [ ] ; Vision changes [ ]   Ortho/Skin: Arthritis [ ] ; Joint pain [ Y]; Muscle pain [ ] ; Joint swelling [ ] ; Back Pain [Y ]; Rash [ ]   Psych: Depression[ ] ; Anxiety[ ]   Heme: Bleeding problems [ ] ; Clotting disorders [ ] ; Anemia [ ]   Endocrine: Diabetes [ ] ; Thyroid dysfunction[ ]    Past Medical History:  Diagnosis Date   Acute pulmonary edema (HCC) 09/06/2022   Acute respiratory failure with hypoxia (HCC) 09/06/2022   Acute systolic CHF (congestive heart failure) (HCC) 09/08/2022   Anxiety    Arthritis    Basal cell carcinoma 07/03/2021   Bundle branch block, left    Cancer (HCC)    Complication of anesthesia    laryngeal spams  50 yrs ago   DCM (dilated cardiomyopathy) (HCC) 09/08/2022   Depression    DNR (do not resuscitate) 09/06/2022   Dysphagia    Elevated troponin 09/06/2022   Essential hypertension 06/17/2013   Gastro-esophageal reflux disease without esophagitis 07/03/2021   Generalized anxiety disorder 07/03/2021   Genetic testing 08/02/2021   [facility] CancerNext-Expanded Panel was Negative. 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 Report date is 07/26/2021.     The CancerNext-Expanded gene panel offered by [facility] and includes sequencing, rearrangement, and RNA analysis for the following 77 genes: AIP, ALK, APC, ATM, AXIN2, BAP1, BARD1, BLM, BMPR1A, BRCA1, BRCA2, BRIP1, CDC73, CDH1, CDK4,    GERD (gastroesophageal reflux disease)    Hereditary and idiopathic neuropathy, unspecified 09/18/2022   History of breast cancer 09/18/2022   Hyperlipidemia    Hypokalemia 09/08/2022   Kyphosis    Left bundle branch block 07/03/2021   Low back pain 09/18/2022   Malignant neoplasm of overlapping sites of right female breast (HCC) 07/01/2021   Malignant neoplasm of upper-inner quadrant of right breast in female, 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   Menopausal symptoms 09/18/2022   Non-ST elevation (NSTEMI) myocardial infarction (HCC) 09/08/2022   Osteopenia of right thigh 07/03/2021   Other chronic pain 07/03/2021   PAC (premature atrial contraction) 06/17/2013   Pain in right knee 01/01/2018   Parageusia 07/03/2021   PAT (paroxysmal atrial tachycardia) 06/17/2013   Personal history of colonic polyps 07/03/2021   Plantar fasciitis of right foot    Preop cardiovascular exam 10/16/2017   Preoperative cardiovascular examination 10/16/2017   Primary localized osteoarthrosis of right shoulder 11/10/2017   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     Ascorbic Acid (VITAMIN C) 1000 MG tablet Take 1,000 mg by mouth daily.     aspirin EC 81 MG tablet Take 81 mg by mouth See admin instructions. Take one tablet by mouth on Monday Wednesday and Fridays only per patient     Calcium Carb-Cholecalciferol (CALCIUM-VITAMIN D) 600-400 MG-UNIT TABS Take 1 tablet by mouth daily.     calcium carbonate (TUMS - DOSED IN MG ELEMENTAL CALCIUM) 500 MG chewable tablet Chew 1 tablet by mouth daily as needed for indigestion or heartburn.     Cholecalciferol 4000 units CAPS Take 4,000 Units by mouth daily.     dapagliflozin propanediol (FARXIGA) 10 MG TABS tablet Take 1 tablet (10 mg total) by mouth daily. 30 tablet 0   escitalopram (LEXAPRO) 10 MG tablet Take 10 mg by mouth every evening.      fish oil-omega-3 fatty acids 1000 MG capsule Take 1 g by mouth every other day.     Flaxseed, Linseed, (FLAXSEED OIL) 1000 MG CAPS Take 1,000 mg by mouth every other day.  furosemide (LASIX) 40 MG tablet Take 1 tablet (40 mg total) by mouth daily. 30 tablet 0   LORazepam (ATIVAN) 0.5 MG tablet Take 0.5 mg by mouth every 6 (six) hours as needed for anxiety.     Magnesium 250 MG TABS Take 250 mg by mouth daily.      metoprolol (TOPROL-XL) 200 MG 24 hr tablet Take 200 mg by mouth every evening.      omeprazole (PRILOSEC) 40 MG capsule Take 1 capsule (40 mg total) by mouth 2 (two) times daily. 60 capsule 0   pyridOXINE (VITAMIN B-6) 100 MG tablet Take 100 mg by mouth daily.     rosuvastatin (CRESTOR) 20 MG tablet Take 20 mg by mouth every evening.      gabapentin (NEURONTIN) 100 MG capsule Take 1 capsule (100 mg total) by mouth 4 (four) times daily. (Patient not taking: Reported on 09/24/2022) 30 capsule 3   potassium chloride SA (KLOR-CON M) 20 MEQ tablet Take 1 tablet (20 mEq total) by mouth daily. (Patient not taking:  Reported on 09/24/2022) 30 tablet 0   No current facility-administered medications for this encounter.    Allergies  Allergen Reactions   Amlodipine Swelling   Benicar Hct [Olmesartan Medoxomil-Hctz] Other (See Comments)    Edema of lower extremities along with diffuse body rash   Codeine Other (See Comments)    Drops blood pressure   Diovan [Valsartan] Other (See Comments)    Palpitations.     Niacin And Related Other (See Comments)    Turns patient red, makes her pass out   Ropinirole Hcl Other (See Comments)      Social History   Socioeconomic History   Marital status: Single    Spouse name: Not on file   Number of children: 1   Years of education: Not on file   Highest education level: Not on file  Occupational History   Occupation: Retired  Tobacco Use   Smoking status: Former    Packs/day: 0.50    Years: 15.00    Additional pack years: 0.00    Total pack years: 7.50    Types: Cigarettes   Smokeless tobacco: Never  Vaping Use   Vaping Use: Never used  Substance and Sexual Activity   Alcohol use: Yes    Comment: ? daily drinker?   Drug use: No   Sexual activity: Not Currently  Other Topics Concern   Not on file  Social History Narrative   Not on file   Social Determinants of Health   Financial Resource Strain: Low Risk  (09/09/2022)   Overall Financial Resource Strain (CARDIA)    Difficulty of Paying Living Expenses: Not hard at all  Food Insecurity: No Food Insecurity (09/06/2022)   Hunger Vital Sign    Worried About Running Out of Food in the Last Year: Never true    Ran Out of Food in the Last Year: Never true  Transportation Needs: No Transportation Needs (09/06/2022)   PRAPARE - Administrator, Civil Service (Medical): No    Lack of Transportation (Non-Medical): No  Physical Activity: Not on file  Stress: Not on file  Social Connections: Not on file  Intimate Partner Violence: Not At Risk (09/06/2022)   Humiliation, Afraid, Rape, and  Kick questionnaire    Fear of Current or Ex-Partner: No    Emotionally Abused: No    Physically Abused: No    Sexually Abused: No      Family History  Problem Relation Age  of Onset   Hyperlipidemia Mother    Hypertension Mother    Heart attack Mother    Colon cancer Father        dx. 12s   Prostate cancer Father        dx. 90s   Breast cancer Cousin        dx. 78s, paternal   Sudden death Neg Hx    Diabetes Neg Hx     Vitals:   09/24/22 1458  BP: (!) 150/84  Pulse: 74  SpO2: 100%  Weight: 53.3 kg (117 lb 9.6 oz)  Height: 5\' 3"  (1.6 m)   Wt Readings from Last 3 Encounters:  09/24/22 53.3 kg (117 lb 9.6 oz)  09/23/22 53.1 kg (117 lb)  09/09/22 55.7 kg (122 lb 12.7 oz)    Reds Clip 32%.   PHYSICAL EXAM: General:  Well appearing. No respiratory difficulty. Walked in the clinic  HEENT: normal Neck: supple. no JVD. Carotids 2+ bilat; no bruits. No lymphadenopathy or thryomegaly appreciated. Cor: PMI nondisplaced. Regular rate & rhythm. No rubs, gallops or murmurs. Lungs: clear Abdomen: soft, nontender, nondistended. No hepatosplenomegaly. No bruits or masses. Good bowel sounds. Extremities: no cyanosis, clubbing, rash, edema Neuro: alert & oriented x 3, cranial nerves grossly intact. moves all 4 extremities w/o difficulty. Affect pleasant.  ECG: SR with occasional PVC. LBBB 140 ms 67 beats per minute.    ASSESSMENT & PLAN: 1. HFrEF Echo 08/2022 Ef 30-35% + WMA --> Coronaries ok. Possible Takotsubo.  [name] II. Reds CLip 32%. Will cut back lasix as noted below.  GDMT  Diuretic- Having some dizziness when standing. Suspect she is dry. Cut back lasix to 20 mg Mon-Wed-Fri. If  EF recovers would stop. BB- Continue Toprol XL 200 mg daily.  Ace/ARB/ARNI- hold off  with recent elevated creatinine.  MRA- Off spiro per Dr Bing Matter. Creatinine 1.3 .  SGLT2i- Continue farxiga 10 mg daily  Repeat Echo later this month. Suspect will resolve quickly if stress induced. If EF  does not recover will need to add additional GDMT. For now hold off.   2. LBBB EKG : QRS 140 ms   3. HTN  -Elevated.  She tells me she has "White Coat"  -At home SBP 110-120.  - No med changes.   Referred to HFSW (PCP, Medications, Transportation, ETOH Abuse, Drug Abuse, Insurance, Financial ):  No Refer to Pharmacy:  No Refer to Home Health:  No Refer to Advanced Heart Failure Clinic: No  Refer to General Cardiology: Back Dr Bing Matter  Follow up as needed.   Burech Mcfarland NP-C   3:31 PM

## 2022-09-25 DIAGNOSIS — E875 Hyperkalemia: Secondary | ICD-10-CM | POA: Diagnosis not present

## 2022-09-26 ENCOUNTER — Other Ambulatory Visit (HOSPITAL_COMMUNITY): Payer: Self-pay

## 2022-09-26 ENCOUNTER — Telehealth: Payer: Self-pay | Admitting: Cardiology

## 2022-09-26 ENCOUNTER — Telehealth: Payer: Self-pay

## 2022-09-26 MED ORDER — DAPAGLIFLOZIN PROPANEDIOL 10 MG PO TABS: 10.0000 mg | ORAL_TABLET | Freq: Every day | ORAL | 2 refills | Status: DC

## 2022-09-26 NOTE — Telephone Encounter (Signed)
-----   Message from Georgeanna Lea, MD sent at 09/25/2022  8:41 AM EDT ----- Chem-7 showed Stable kidney function

## 2022-09-26 NOTE — Telephone Encounter (Signed)
 *  STAT* If patient is at the pharmacy, call can be transferred to refill team.   1. Which medications need to be refilled? (please list name of each medication and dose if known)   dapagliflozin propanediol (FARXIGA) 10 MG TABS tablet    2. Which pharmacy/location (including street and city if local pharmacy) is medication to be sent to? [facility] - [place], [place] - [address]   3. Do they need a 30 day or 90 day supply? 90 days

## 2022-09-26 NOTE — Telephone Encounter (Signed)
Refill of Farxiga 10 mg sent to [facility] on [address] in [place].

## 2022-09-26 NOTE — Telephone Encounter (Signed)
Patient notified through my chart.

## 2022-09-27 LAB — FECAL OCCULT BLOOD, IMMUNOCHEMICAL: Fecal Occult Bld: NEGATIVE

## 2022-09-30 DIAGNOSIS — K080 Exfoliation of teeth due to systemic causes: Secondary | ICD-10-CM | POA: Diagnosis not present

## 2022-10-15 ENCOUNTER — Ambulatory Visit (HOSPITAL_BASED_OUTPATIENT_CLINIC_OR_DEPARTMENT_OTHER)
Admission: RE | Admit: 2022-10-15 | Discharge: 2022-10-15 | Disposition: A | Discharge status: Home / Self Care | Payer: Medicare Other | Source: Ambulatory Visit | Attending: Cardiology | Admitting: Cardiology

## 2022-10-15 DIAGNOSIS — I420 Dilated cardiomyopathy: Secondary | ICD-10-CM | POA: Diagnosis not present

## 2022-10-15 LAB — ECHOCARDIOGRAM COMPLETE
AR max vel: 1.32 cm2
AV Area VTI: 1.32 cm2
AV Area mean vel: 1.25 cm2
AV Mean grad: 5 mmHg
AV Peak grad: 9.7 mmHg
Ao pk vel: 1.56 m/s
Area-P 1/2: 5.06 cm2
S' Lateral: 3 cm
Single Plane A4C EF: 55 %

## 2022-10-22 ENCOUNTER — Telehealth: Payer: Self-pay

## 2022-10-22 NOTE — Telephone Encounter (Signed)
LVM and My Chart Message per DPR- per Dr. Krasowski's note regarding normal lab results. Encouraged to call with any questions or concerns. Routed to PCP.  

## 2022-11-10 DIAGNOSIS — I509 Heart failure, unspecified: Secondary | ICD-10-CM | POA: Diagnosis not present

## 2022-11-10 DIAGNOSIS — R3989 Other symptoms and signs involving the genitourinary system: Secondary | ICD-10-CM | POA: Diagnosis not present

## 2022-11-10 DIAGNOSIS — Z853 Personal history of malignant neoplasm of breast: Secondary | ICD-10-CM | POA: Diagnosis not present

## 2022-11-10 DIAGNOSIS — F411 Generalized anxiety disorder: Secondary | ICD-10-CM | POA: Diagnosis not present

## 2022-11-10 DIAGNOSIS — R5383 Other fatigue: Secondary | ICD-10-CM | POA: Diagnosis not present

## 2022-11-10 DIAGNOSIS — Z6821 Body mass index (BMI) 21.0-21.9, adult: Secondary | ICD-10-CM | POA: Diagnosis not present

## 2022-11-10 DIAGNOSIS — I700 Atherosclerosis of aorta: Secondary | ICD-10-CM | POA: Diagnosis not present

## 2022-11-11 ENCOUNTER — Encounter: Payer: Self-pay | Admitting: Cardiology

## 2022-11-11 ENCOUNTER — Ambulatory Visit: Payer: Medicare Other | Attending: Cardiology | Admitting: Cardiology

## 2022-11-11 VITALS — BP 128/64 | HR 65 | Ht 62.0 in | Wt 119.0 lb

## 2022-11-11 DIAGNOSIS — I420 Dilated cardiomyopathy: Secondary | ICD-10-CM | POA: Diagnosis not present

## 2022-11-11 DIAGNOSIS — Z170 Estrogen receptor positive status [ER+]: Secondary | ICD-10-CM

## 2022-11-11 DIAGNOSIS — C50211 Malignant neoplasm of upper-inner quadrant of right female breast: Secondary | ICD-10-CM | POA: Diagnosis not present

## 2022-11-11 DIAGNOSIS — K219 Gastro-esophageal reflux disease without esophagitis: Secondary | ICD-10-CM

## 2022-11-11 NOTE — Progress Notes (Signed)
Cardiology Office Note:    Date:  11/11/2022   ID:  Jasmine Swanson, DOB [birth year], MRN [id number]  PCP:  Gweneth Dimitri, MD  Cardiologist:  Gypsy Balsam, MD    Referring MD: Gweneth Dimitri, MD   Chief Complaint  Patient presents with   Medication Management  Doing well  History of Present Illness:    Jasmine Swanson is a 82 y.o. female past medical history significant for cardiomyopathy ejection fraction 30 to 35%, cardiac catheterization showing normal coronaries the conclusion was this is most likely stress-induced cardiomyopathy she was put on guideline directed medical therapy however he had difficulty because of primary allergy to ACE inhibitor with swelling so there was no ACE inhibitor or ARB Entresto.  Recently she had echocardiogram performed which showed normalization of left ventricle ejection fraction.  She seems to be doing quite well and asymptomatic.  Quite happy about the fact that her heart normalized.  Past Medical History:  Diagnosis Date   Acute pulmonary edema (HCC) 09/06/2022   Acute respiratory failure with hypoxia (HCC) 09/06/2022   Acute systolic CHF (congestive heart failure) (HCC) 09/08/2022   Anxiety    Arthritis    Basal cell carcinoma 07/03/2021   Bundle branch block, left    Cancer (HCC)    Complication of anesthesia    laryngeal spams 50 yrs ago   DCM (dilated cardiomyopathy) (HCC) 09/08/2022   Depression    DNR (do not resuscitate) 09/06/2022   Dysphagia    Elevated troponin 09/06/2022   Essential hypertension 06/17/2013   Gastro-esophageal reflux disease without esophagitis 07/03/2021   Generalized anxiety disorder 07/03/2021   Genetic testing 08/02/2021   [facility] CancerNext-Expanded Panel was Negative. 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 Report date is 07/26/2021.     The CancerNext-Expanded gene panel offered by [facility] and includes sequencing, rearrangement, and RNA analysis for  the following 77 genes: AIP, ALK, APC, ATM, AXIN2, BAP1, BARD1, BLM, BMPR1A, BRCA1, BRCA2, BRIP1, CDC73, CDH1, CDK4,    GERD (gastroesophageal reflux disease)    Hereditary and idiopathic neuropathy, unspecified 09/18/2022   History of breast cancer 09/18/2022   Hyperlipidemia    Hypokalemia 09/08/2022   Kyphosis    Left bundle branch block 07/03/2021   Low back pain 09/18/2022   Malignant neoplasm of overlapping sites of right female breast (HCC) 07/01/2021   Malignant neoplasm of upper-inner quadrant of right breast in female, 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   Menopausal symptoms 09/18/2022   Non-ST elevation (NSTEMI) myocardial infarction (HCC) 09/08/2022   Osteopenia of right thigh 07/03/2021   Other chronic pain 07/03/2021   PAC (premature atrial contraction) 06/17/2013   Pain in right knee 01/01/2018   Parageusia 07/03/2021   PAT (paroxysmal atrial tachycardia) 06/17/2013   Personal history of colonic polyps 07/03/2021   Plantar fasciitis of right foot    Preop cardiovascular exam 10/16/2017   Preoperative cardiovascular examination 10/16/2017   Primary localized osteoarthrosis of right shoulder 11/10/2017   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   Restless legs syndrome 07/03/2021   Right shoulder pain 01/20/2012    Past Surgical History:  Procedure Laterality Date   ABDOMINAL HYSTERECTOMY     bladder tac     BREAST BIOPSY Right 06/25/2021   BREAST LUMPECTOMY Right 07/09/2021   BREAST LUMPECTOMY WITH RADIOACTIVE SEED LOCALIZATION Right 07/09/2021   Procedure: RIGHT BREAST LUMPECTOMY WITH RADIOACTIVE SEED LOCALIZATION;  Surgeon: Manus Rudd, MD;  Location: [facility];  Service: General;  Laterality: Right;   CARDIAC CATHETERIZATION     LEFT HEART CATH AND CORONARY ANGIOGRAPHY N/A 09/08/2022   Procedure: LEFT HEART CATH AND CORONARY ANGIOGRAPHY;  Surgeon: Yvonne Kendall, MD;  Location: [facility];  Service:  Cardiovascular;  Laterality: N/A;   TONSILLECTOMY     TOTAL SHOULDER ARTHROPLASTY Right 11/10/2017   Procedure: TOTAL SHOULDER ARTHROPLASTY;  Surgeon: Teryl Lucy, MD;  Location: [facility] OR;  Service: Orthopedics;  Laterality: Right;    Current Medications: Current Meds  Medication Sig   acetaminophen (TYLENOL) 500 MG tablet Take 500 mg by mouth every 6 (six) hours as needed for moderate pain.   Ascorbic Acid (VITAMIN C) 1000 MG tablet Take 1,000 mg by mouth daily.   aspirin EC 81 MG tablet Take 81 mg by mouth See admin instructions. Take one tablet by mouth on Monday Wednesday and Fridays only per patient   Calcium Carb-Cholecalciferol (CALCIUM-VITAMIN D) 600-400 MG-UNIT TABS Take 1 tablet by mouth daily.   calcium carbonate (TUMS - DOSED IN MG ELEMENTAL CALCIUM) 500 MG chewable tablet Chew 1 tablet by mouth daily as needed for indigestion or heartburn.   Cholecalciferol 4000 units CAPS Take 4,000 Units by mouth daily.   dapagliflozin propanediol (FARXIGA) 10 MG TABS tablet Take 1 tablet (10 mg total) by mouth daily.   escitalopram (LEXAPRO) 10 MG tablet Take 10 mg by mouth every evening.    fish oil-omega-3 fatty acids 1000 MG capsule Take 1 g by mouth every other day.   Flaxseed, Linseed, (FLAXSEED OIL) 1000 MG CAPS Take 1,000 mg by mouth every other day.   furosemide (LASIX) 20 MG tablet Take 1 tablet (20 mg total) by mouth every Monday, Wednesday, and Friday.   gabapentin (NEURONTIN) 100 MG capsule Take 1 capsule (100 mg total) by mouth 4 (four) times daily.   LORazepam (ATIVAN) 0.5 MG tablet Take 0.5 mg by mouth every 6 (six) hours as needed for anxiety.   Magnesium 250 MG TABS Take 250 mg by mouth daily.    metoprolol (TOPROL-XL) 200 MG 24 hr tablet Take 1 tablet (200 mg total) by mouth every evening.   mirtazapine (REMERON) 7.5 MG tablet Take 7.5 mg by mouth at bedtime.   omeprazole (PRILOSEC) 40 MG capsule Take 1 capsule (40 mg total) by mouth 2 (two) times daily.   pyridOXINE  (VITAMIN B-6) 100 MG tablet Take 100 mg by mouth daily.   rosuvastatin (CRESTOR) 20 MG tablet Take 20 mg by mouth every evening.      Allergies:   Amlodipine, Benicar hct [olmesartan medoxomil-hctz], Codeine, Diovan [valsartan], Niacin and related, and Ropinirole hcl   Social History   Socioeconomic History   Marital status: Single    Spouse name: Not on file   Number of children: 1   Years of education: Not on file   Highest education level: Not on file  Occupational History   Occupation: Retired  Tobacco Use   Smoking status: Former    Packs/day: 0.50    Years: 15.00    Additional pack years: 0.00    Total pack years: 7.50    Types: Cigarettes   Smokeless tobacco: Never  Vaping Use   Vaping Use: Never used  Substance and Sexual Activity   Alcohol use: Yes    Comment: ? daily drinker?   Drug use: No   Sexual activity: Not Currently  Other Topics Concern   Not on file  Social History Narrative   Not on file  Social Determinants of Health   Financial Resource Strain: Low Risk  (09/09/2022)   Overall Financial Resource Strain (CARDIA)    Difficulty of Paying Living Expenses: Not hard at all  Food Insecurity: No Food Insecurity (09/06/2022)   Hunger Vital Sign    Worried About Running Out of Food in the Last Year: Never true    Ran Out of Food in the Last Year: Never true  Transportation Needs: No Transportation Needs (09/06/2022)   PRAPARE - Administrator, Civil Service (Medical): No    Lack of Transportation (Non-Medical): No  Physical Activity: Not on file  Stress: Not on file  Social Connections: Not on file     Family History: The patient's family history includes Breast cancer in her cousin; Colon cancer in her father; Heart attack in her mother; Hyperlipidemia in her mother; Hypertension in her mother; Prostate cancer in her father. There is no history of Sudden death or Diabetes. ROS:   Please see the history of present illness.    All 14 point  review of systems negative except as described per history of present illness  EKGs/Labs/Other Studies Reviewed:      Recent Labs: 09/06/2022: B Natriuretic Peptide 446.7 09/07/2022: ALT 21 09/08/2022: Magnesium 2.2 09/09/2022: Hemoglobin 11.7; Platelets 149 09/23/2022: BUN 20; Creatinine, Ser 1.33; Potassium 4.7; Sodium 133  Recent Lipid Panel No results found for: "CHOL", "TRIG", "HDL", "CHOLHDL", "VLDL", "LDLCALC", "LDLDIRECT"  Physical Exam:    VS:  BP 128/64 (BP Location: Left Arm, Patient Position: Sitting)   Pulse 65   Ht 5\' 2"  (1.575 m)   Wt 119 lb (54 kg)   SpO2 98%   BMI 21.77 kg/m     Wt Readings from Last 3 Encounters:  11/11/22 119 lb (54 kg)  09/24/22 117 lb 9.6 oz (53.3 kg)  09/23/22 117 lb (53.1 kg)     GEN:  Well nourished, well developed in no acute distress HEENT: Normal NECK: No JVD; No carotid bruits LYMPHATICS: No lymphadenopathy CARDIAC: RRR, no murmurs, no rubs, no gallops RESPIRATORY:  Clear to auscultation without rales, wheezing or rhonchi  ABDOMEN: Soft, non-tender, non-distended MUSCULOSKELETAL:  No edema; No deformity  SKIN: Warm and dry LOWER EXTREMITIES: no swelling NEUROLOGIC:  Alert and oriented x 3 PSYCHIATRIC:  Normal affect   ASSESSMENT:    1. Dilated cardiomyopathy (HCC)   2. Gastro-esophageal reflux disease without esophagitis   3. Malignant neoplasm of upper-inner quadrant of right breast in female, estrogen receptor positive (HCC)    PLAN:    In order of problems listed above:  Dilated cardiomyopathy normalization of left ventricle ejection fraction she is feeling overall good but complain of being weak tired exhausted she is asking about potentially discontinuation of some of her cardiac medications specific question was about farxiga and potential discontinuation of this medication I favor to continue monitoring her for another 6 months with medications on board to bring her back to my office in about 3 months to see how she  does if she still feeling weak tired and exhausted we may cut down some of the medication including metoprolol and Farxiga, also anticipate in about 6 months to repeat echocardiogram only limited study to recheck left ventricle ejection fraction and based on that decide about future management with medications.  The key will be continuation of beta-blocker because of history of stress-induced cardiomyopathy. Gastroesophageal reflux disease stable without any symptoms. Malignant breast cancer stable.   Medication Adjustments/Labs and Tests Ordered: Current medicines are  reviewed at length with the patient today.  Concerns regarding medicines are outlined above.  No orders of the defined types were placed in this encounter.  Medication changes: No orders of the defined types were placed in this encounter.   Signed, Georgeanna Lea, MD, [facility] 11/11/2022 6:14 PM    [facility]

## 2022-11-11 NOTE — Patient Instructions (Signed)
Medication Instructions:  Your physician recommends that you continue on your current medications as directed. Please refer to the Current Medication list given to you today.' *If you need a refill on your cardiac medications before your next appointment, please call your pharmacy*   Lab Work: None If you have labs (blood work) drawn today and your tests are completely normal, you will receive your results only by: MyChart Message (if you have MyChart) OR A paper copy in the mail If you have any lab test that is abnormal or we need to change your treatment, we will call you to review the results.   Testing/Procedures: None   Follow-Up: At [facility], you and your health needs are our priority.  As part of our continuing mission to provide you with exceptional heart care, we have created designated Provider Care Teams.  These Care Teams include your primary Cardiologist (physician) and Advanced Practice Providers (APPs -  Physician Assistants and Nurse Practitioners) who all work together to provide you with the care you need, when you need it.  We recommend signing up for the patient portal called "MyChart".  Sign up information is provided on this After Visit Summary.  MyChart is used to connect with patients for Virtual Visits (Telemedicine).  Patients are able to view lab/test results, encounter notes, upcoming appointments, etc.  Non-urgent messages can be sent to your provider as well.   To learn more about what you can do with MyChart, go to [email or web address].    Your next appointment:   3 month(s)  Provider:   Robert Krasowski, MD    Other Instructions   

## 2022-11-28 ENCOUNTER — Telehealth: Payer: Self-pay | Admitting: Nurse Practitioner

## 2022-12-01 ENCOUNTER — Ambulatory Visit: Payer: Medicare Other | Admitting: Nurse Practitioner

## 2022-12-10 DIAGNOSIS — Z8679 Personal history of other diseases of the circulatory system: Secondary | ICD-10-CM | POA: Diagnosis not present

## 2022-12-10 DIAGNOSIS — G479 Sleep disorder, unspecified: Secondary | ICD-10-CM | POA: Diagnosis not present

## 2022-12-10 DIAGNOSIS — R5383 Other fatigue: Secondary | ICD-10-CM | POA: Diagnosis not present

## 2022-12-10 DIAGNOSIS — I5181 Takotsubo syndrome: Secondary | ICD-10-CM | POA: Diagnosis not present

## 2022-12-16 ENCOUNTER — Inpatient Hospital Stay: Payer: Medicare Other | Attending: Nurse Practitioner | Admitting: Nurse Practitioner

## 2022-12-16 ENCOUNTER — Encounter: Payer: Self-pay | Admitting: Nurse Practitioner

## 2022-12-16 ENCOUNTER — Other Ambulatory Visit: Payer: Self-pay

## 2022-12-16 VITALS — BP 181/70 | HR 91 | Temp 97.2°F | Resp 17 | Wt 121.2 lb

## 2022-12-16 DIAGNOSIS — J9601 Acute respiratory failure with hypoxia: Secondary | ICD-10-CM | POA: Insufficient documentation

## 2022-12-16 DIAGNOSIS — C50811 Malignant neoplasm of overlapping sites of right female breast: Secondary | ICD-10-CM | POA: Diagnosis not present

## 2022-12-16 DIAGNOSIS — I252 Old myocardial infarction: Secondary | ICD-10-CM | POA: Insufficient documentation

## 2022-12-16 DIAGNOSIS — Z853 Personal history of malignant neoplasm of breast: Secondary | ICD-10-CM | POA: Diagnosis not present

## 2022-12-16 DIAGNOSIS — Z170 Estrogen receptor positive status [ER+]: Secondary | ICD-10-CM

## 2022-12-16 DIAGNOSIS — I509 Heart failure, unspecified: Secondary | ICD-10-CM | POA: Insufficient documentation

## 2022-12-16 NOTE — Progress Notes (Signed)
Patient Care Team: Gweneth Dimitri, MD as PCP - General (Family Medicine) Dulce Sellar Iline Oven, MD as PCP - Cardiology (Cardiology) Gweneth Dimitri, MD as Consulting Physician (Family Medicine) Manus Rudd, MD as Consulting Physician (General Surgery) Malachy Mood, MD as Consulting Physician (Hematology) Dorothy Puffer, MD as Consulting Physician (Radiation Oncology) Donnelly Angelica, RN as Oncology Nurse Navigator Pershing Proud, RN as Oncology Nurse Navigator Pollyann Samples, NP as Nurse Practitioner (Nurse Practitioner)   CHIEF COMPLAINT: Follow up right breast cancer   Oncology History Overview Note   Cancer Staging  Malignant neoplasm of overlapping sites of right female breast [facility]) Staging form: Breast, AJCC 8th Edition - Clinical stage from 06/25/2021: Stage IA (cT1a, cN0, cM0, G1, ER+, PR+, HER2-) - Signed by Malachy Mood, MD on 07/02/2021    Malignant neoplasm of overlapping sites of right female breast (HCC)  06/19/2021 Imaging   EXAM: DIGITAL DIAGNOSTIC UNILATERAL RIGHT MAMMOGRAM WITH TOMOSYNTHESIS AND CAD; ULTRASOUND RIGHT BREAST LIMITED  FINDINGS: The mass in the right breast on the MLO view remains. On screening mammography, it appears mildly spiculated. It is not definitely spiculated based on today's spot imaging. It is seen only on the MLO view near the level the nipple. It is medially located based on 3D slices.   On physical exam, no suspicious lumps are identified.   Targeted ultrasound is performed, showing a solid mass in the right breast at 3 o'clock, 6 cm the nipple likely correlating with the mammographically identified mass. There is internal blood flow. This irregular mass measures 3 by 4 x 4 mm. No axillary adenopathy.   IMPRESSION: New solid irregular mass in the right breast at 3 o'clock, 6 cm from the nipple on ultrasound. No axillary adenopathy.   06/25/2021 Cancer Staging   Staging form: Breast, AJCC 8th Edition - Clinical stage from 06/25/2021: Stage IA  (cT1a, cN0, cM0, G1, ER+, PR+, HER2-) - Signed by Malachy Mood, MD on 07/02/2021 Stage prefix: Initial diagnosis Histologic grading system: 3 grade system   06/25/2021 Initial Biopsy   Diagnosis Breast, right, needle core biopsy, 3 o'clock, 6cmfn - INVASIVE DUCTAL CARCINOMA WITH CALCIFICATIONS - SEE COMMENT Microscopic Comment Based on the biopsy, the carcinoma appears Nottingham grade 1 of 3 and measures 0.4 cm in greatest linear extent.  PROGNOSTIC INDICATORS Results: Estrogen Receptor: 100%, POSITIVE, STRONG STAINING INTENSITY Progesterone Receptor: 80%, POSITIVE, MODERATE STAINING INTENSITY Proliferation Marker Ki67: 10%  FLUORESCENCE IN-SITU HYBRIDIZATION Results: GROUP 5: HER2 **NEGATIVE**   07/01/2021 Initial Diagnosis   Malignant neoplasm of overlapping sites of right female breast [facility])    Genetic Testing   [facility] CancerNext-Expanded Panel was Negative. 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 Report date is 07/26/2021.  The CancerNext-Expanded gene panel offered by [facility] and includes sequencing, rearrangement, and RNA analysis for the following 77 genes: AIP, ALK, APC, ATM, AXIN2, BAP1, BARD1, BLM, BMPR1A, BRCA1, BRCA2, BRIP1, CDC73, CDH1, CDK4, CDKN1B, CDKN2A, CHEK2, CTNNA1, DICER1, FANCC, FH, FLCN, GALNT12, KIF1B, LZTR1, MAX, MEN1, MET, MLH1, MSH2, MSH3, MSH6, MUTYH, NBN, NF1, NF2, NTHL1, PALB2, PHOX2B, PMS2, POT1, PRKAR1A, PTCH1, PTEN, RAD51C, RAD51D, RB1, RECQL, RET, SDHA, SDHAF2, SDHB, SDHC, SDHD, SMAD4, SMARCA4, SMARCB1, SMARCE1, STK11, SUFU, TMEM127, TP53, TSC1, TSC2, VHL and XRCC2 (sequencing and deletion/duplication); EGFR, EGLN1, HOXB13, KIT, MITF, PDGFRA, POLD1, and POLE (sequencing only); EPCAM and GREM1 (deletion/duplication only).    07/09/2021 Cancer Staging   Staging form: Breast, AJCC 8th Edition - Pathologic stage from 07/09/2021: Stage Unknown (  pT1c, pNX, cM0, G1, ER+, PR+, HER2-) - Signed by Malachy Mood, MD on  08/20/2021 Stage prefix: Initial diagnosis Histologic grading system: 3 grade system Residual tumor (R): R0 - None   11/27/2021 Survivorship   SCP delivered by Santiago Glad, NP      CURRENT THERAPY: Surveillance   INTERVAL HISTORY Jasmine Swanson returns for follow up as scheduled. Last seen by me for survivorship 11/27/21. Mammogram 07/03/22 was negative.  Doing well from a breast cancer standpoint, without concerns or changes such as new lump/mass, nipple discharge or inversion, or skin change.  She was hospitalized in April for acute hypoxic respiratory failure secondary to flash pulmonary edema from acute heart failure/NSTEMI.  She continues to recover.  ROS  All other systems reviewed and negative  Past Medical History:  Diagnosis Date   Acute pulmonary edema (HCC) 09/06/2022   Acute respiratory failure with hypoxia (HCC) 09/06/2022   Acute systolic CHF (congestive heart failure) (HCC) 09/08/2022   Anxiety    Arthritis    Basal cell carcinoma 07/03/2021   Bundle branch block, left    Cancer (HCC)    Complication of anesthesia    laryngeal spams 50 yrs ago   DCM (dilated cardiomyopathy) (HCC) 09/08/2022   Depression    DNR (do not resuscitate) 09/06/2022   Dysphagia    Elevated troponin 09/06/2022   Essential hypertension 06/17/2013   Gastro-esophageal reflux disease without esophagitis 07/03/2021   Generalized anxiety disorder 07/03/2021   Genetic testing 08/02/2021   [facility] CancerNext-Expanded Panel was Negative. 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 Report date is 07/26/2021.     The CancerNext-Expanded gene panel offered by [facility] and includes sequencing, rearrangement, and RNA analysis for the following 77 genes: AIP, ALK, APC, ATM, AXIN2, BAP1, BARD1, BLM, BMPR1A, BRCA1, BRCA2, BRIP1, CDC73, CDH1, CDK4,    GERD (gastroesophageal reflux disease)    Hereditary and idiopathic neuropathy, unspecified 09/18/2022   History of  breast cancer 09/18/2022   Hyperlipidemia    Hypokalemia 09/08/2022   Kyphosis    Left bundle branch block 07/03/2021   Low back pain 09/18/2022   Malignant neoplasm of overlapping sites of right female breast (HCC) 07/01/2021   Malignant neoplasm of upper-inner quadrant of right breast in female, 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   Menopausal symptoms 09/18/2022   Non-ST elevation (NSTEMI) myocardial infarction (HCC) 09/08/2022   Osteopenia of right thigh 07/03/2021   Other chronic pain 07/03/2021   PAC (premature atrial contraction) 06/17/2013   Pain in right knee 01/01/2018   Parageusia 07/03/2021   PAT (paroxysmal atrial tachycardia) 06/17/2013   Personal history of colonic polyps 07/03/2021   Plantar fasciitis of right foot    Preop cardiovascular exam 10/16/2017   Preoperative cardiovascular examination 10/16/2017   Primary localized osteoarthrosis of right shoulder 11/10/2017   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   Restless legs syndrome 07/03/2021   Right shoulder pain 01/20/2012     Past Surgical History:  Procedure Laterality Date   ABDOMINAL HYSTERECTOMY     bladder tac     BREAST BIOPSY Right 06/25/2021   BREAST LUMPECTOMY Right 07/09/2021   BREAST LUMPECTOMY WITH RADIOACTIVE SEED LOCALIZATION Right 07/09/2021   Procedure: RIGHT BREAST LUMPECTOMY WITH RADIOACTIVE SEED LOCALIZATION;  Surgeon: Manus Rudd, MD;  Location: [facility];  Service: General;  Laterality: Right;   CARDIAC CATHETERIZATION     LEFT HEART CATH AND CORONARY ANGIOGRAPHY N/A 09/08/2022   Procedure: LEFT HEART  CATH AND CORONARY ANGIOGRAPHY;  Surgeon: Yvonne Kendall, MD;  Location: [facility];  Service: Cardiovascular;  Laterality: N/A;   TONSILLECTOMY     TOTAL SHOULDER ARTHROPLASTY Right 11/10/2017   Procedure: TOTAL SHOULDER ARTHROPLASTY;  Surgeon: Teryl Lucy, MD;  Location: [facility] OR;  Service: Orthopedics;  Laterality: Right;      Outpatient Encounter Medications as of 12/16/2022  Medication Sig   acetaminophen (TYLENOL) 500 MG tablet Take 500 mg by mouth every 6 (six) hours as needed for moderate pain.   Ascorbic Acid (VITAMIN C) 1000 MG tablet Take 1,000 mg by mouth daily.   aspirin EC 81 MG tablet Take 81 mg by mouth See admin instructions. Take one tablet by mouth on Monday Wednesday and Fridays only per patient   Calcium Carb-Cholecalciferol (CALCIUM-VITAMIN D) 600-400 MG-UNIT TABS Take 1 tablet by mouth daily.   calcium carbonate (TUMS - DOSED IN MG ELEMENTAL CALCIUM) 500 MG chewable tablet Chew 1 tablet by mouth daily as needed for indigestion or heartburn.   Cholecalciferol 4000 units CAPS Take 4,000 Units by mouth daily.   dapagliflozin propanediol (FARXIGA) 10 MG TABS tablet Take 1 tablet (10 mg total) by mouth daily.   escitalopram (LEXAPRO) 10 MG tablet Take 10 mg by mouth every evening.    fish oil-omega-3 fatty acids 1000 MG capsule Take 1 g by mouth every other day.   Flaxseed, Linseed, (FLAXSEED OIL) 1000 MG CAPS Take 1,000 mg by mouth every other day.   furosemide (LASIX) 20 MG tablet Take 1 tablet (20 mg total) by mouth every Monday, Wednesday, and Friday.   gabapentin (NEURONTIN) 100 MG capsule Take 1 capsule (100 mg total) by mouth 4 (four) times daily.   LORazepam (ATIVAN) 0.5 MG tablet Take 0.5 mg by mouth every 6 (six) hours as needed for anxiety.   Magnesium 250 MG TABS Take 250 mg by mouth daily.    metoprolol (TOPROL-XL) 200 MG 24 hr tablet Take 1 tablet (200 mg total) by mouth every evening.   omeprazole (PRILOSEC) 40 MG capsule Take 1 capsule (40 mg total) by mouth 2 (two) times daily.   pyridOXINE (VITAMIN B-6) 100 MG tablet Take 100 mg by mouth daily.   rosuvastatin (CRESTOR) 20 MG tablet Take 20 mg by mouth every evening.    [DISCONTINUED] mirtazapine (REMERON) 7.5 MG tablet Take 7.5 mg by mouth at bedtime.   [DISCONTINUED] potassium chloride SA (KLOR-CON M) 20 MEQ tablet Take 1 tablet  (20 mEq total) by mouth daily. (Patient not taking: Reported on 09/24/2022)   No facility-administered encounter medications on file as of 12/16/2022.     Today's Vitals   12/16/22 1129  BP: (!) 181/70  Pulse: 91  Resp: 17  Temp: (!) 97.2 F (36.2 C)  TempSrc: Oral  SpO2: 99%  Weight: 121 lb 3.2 oz (55 kg)   Body mass index is 22.17 kg/m.   PHYSICAL EXAM GENERAL:alert, no distress and comfortable SKIN: no rash  EYES: sclera clear NECK: without mass LYMPH:  no palpable cervical or supraclavicular lymphadenopathy  LUNGS: clear with normal breathing effort HEART: regular rate & rhythm, no lower extremity edema ABDOMEN: abdomen soft, non-tender and normal bowel sounds NEURO: alert & oriented x 3 with fluent speech, no focal motor/sensory deficits Breast exam: nipple inversion bilaterally, no discharge. S/p right lumpectomy, incisions completely healed. No palpable mass or nodularity in either breast or axilla that I could appreciate  CBC    Component Value Date/Time   WBC 6.6 09/09/2022 0105  RBC 3.61 (L) 09/09/2022 0105   HGB 11.7 (L) 09/09/2022 0105   HGB 12.6 07/03/2021 1231   HCT 35.9 (L) 09/09/2022 0105   PLT 149 (L) 09/09/2022 0105   PLT 187 07/03/2021 1231   MCV 99.4 09/09/2022 0105   MCH 32.4 09/09/2022 0105   MCHC 32.6 09/09/2022 0105   RDW 12.4 09/09/2022 0105   LYMPHSABS 2.0 09/09/2022 0105   MONOABS 0.7 09/09/2022 0105   EOSABS 0.1 09/09/2022 0105   BASOSABS 0.0 09/09/2022 0105     CMP     Component Value Date/Time   NA 133 (L) 09/23/2022 1439   K 4.7 09/23/2022 1439   CL 95 (L) 09/23/2022 1439   CO2 21 09/23/2022 1439   GLUCOSE 111 (H) 09/23/2022 1439   GLUCOSE 115 (H) 09/09/2022 0105   BUN 20 09/23/2022 1439   CREATININE 1.33 (H) 09/23/2022 1439   CREATININE 0.90 07/03/2021 1231   CALCIUM 10.0 09/23/2022 1439   PROT 6.3 (L) 09/07/2022 0102   ALBUMIN 3.6 09/07/2022 0102   AST 30 09/07/2022 0102   AST 32 07/03/2021 1231   ALT 21 09/07/2022  0102   ALT 26 07/03/2021 1231   ALKPHOS 40 09/07/2022 0102   BILITOT 1.1 09/07/2022 0102   BILITOT 0.5 07/03/2021 1231   GFRNONAA 56 (L) 09/09/2022 0105   GFRNONAA >60 07/03/2021 1231   GFRAA >60 11/11/2017 0408     ASSESSMENT & PLAN: 82 year old female   1. Stage I right breast cancer, invasive ductal carcinoma and DCIS, ER+/PR+/HER2- -Diagnosed in 04/2021, s/p lumpectomy 07/09/2021.  She declined adjuvant radiation therapy and anti-estrogen therapy.  -Jasmine Swanson is clinically doing well.  Breast exam is benign.  Most recent mammogram 07/03/2022 is negative.  Overall no clinical concern for recurrence -Continue annual mammogram and breast cancer surveillance -Follow-up in 1 year, or sooner if needed  2. Acute hypoxic respiratory failure secondary to flash pulmonary edema from acute heart failure/NSTEMI -Hospitalized 08/2022, continues to recover -Felt to be stress-induced -Continue med regimen and follow-up with Dr. Krasowski/cardiology   PLAN: -Last mammogram and recent hospital course reviewed -Continue breast cancer surveillance -F/up in 1 year, or sooner if needed -Mammo 2/205   Orders Placed This Encounter  Procedures   MM DIAG BREAST TOMO BILATERAL    Standing Status:   Future    Standing Expiration Date:   12/16/2023    Order Specific Question:   Reason for Exam (SYMPTOM  OR DIAGNOSIS REQUIRED)    Answer:   h/o IDC s/p lumpectomy 06/2021, declined adjuvant RT and antiestrogen    Order Specific Question:   Preferred imaging location?    Answer:   [facility]      All questions were answered. The patient knows to call the clinic with any problems, questions or concerns. No barriers to learning were detected.   Santiago Glad, NP-C 12/16/2022

## 2022-12-17 ENCOUNTER — Telehealth: Payer: Self-pay | Admitting: Nurse Practitioner

## 2022-12-19 ENCOUNTER — Ambulatory Visit: Payer: Medicare Other | Attending: Cardiology | Admitting: Cardiology

## 2022-12-19 ENCOUNTER — Encounter: Payer: Self-pay | Admitting: Cardiology

## 2022-12-19 ENCOUNTER — Ambulatory Visit: Payer: Medicare Other | Attending: Cardiology

## 2022-12-19 VITALS — BP 132/74 | HR 48 | Ht 64.0 in | Wt 120.0 lb

## 2022-12-19 DIAGNOSIS — R002 Palpitations: Secondary | ICD-10-CM

## 2022-12-19 DIAGNOSIS — Z170 Estrogen receptor positive status [ER+]: Secondary | ICD-10-CM

## 2022-12-19 DIAGNOSIS — I420 Dilated cardiomyopathy: Secondary | ICD-10-CM | POA: Diagnosis not present

## 2022-12-19 DIAGNOSIS — C50811 Malignant neoplasm of overlapping sites of right female breast: Secondary | ICD-10-CM | POA: Diagnosis not present

## 2022-12-19 NOTE — Progress Notes (Unsigned)
Cardiology Office Note:    Date:  12/19/2022   ID:  Jasmine Swanson, DOB Sep 21, 1940, MRN [id number]  PCP:  Gweneth Dimitri, MD  Cardiologist:  Gypsy Balsam, MD    Referring MD: Gweneth Dimitri, MD   Chief Complaint  Patient presents with   Palpitations    History of Present Illness:    Jasmine Swanson is a 82 y.o. 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 Recently she had echocardiogram performed which showed normalization of left ventricle ejection fraction.  She requested to be seen because she started experiencing some palpitations.  Few days ago she felt for few days her heart skipping and strong times in the chest.  Then it got better yesterday happen again.  Today she seems to be doing fine.  Otherwise she is doing better she said she got more energy she can do more things more clear things are really getting better  Past Medical History:  Diagnosis Date   Acute pulmonary edema (HCC) 09/06/2022   Acute respiratory failure with hypoxia (HCC) 09/06/2022   Acute systolic CHF (congestive heart failure) (HCC) 09/08/2022   Anxiety    Arthritis    Basal cell carcinoma 07/03/2021   Bundle branch block, left    Cancer (HCC)    Complication of anesthesia    laryngeal spams 50 yrs ago   DCM (dilated cardiomyopathy) (HCC) 09/08/2022   Depression    DNR (do not resuscitate) 09/06/2022   Dysphagia    Elevated troponin 09/06/2022   Essential hypertension 06/17/2013   Gastro-esophageal reflux disease without esophagitis 07/03/2021   Generalized anxiety disorder 07/03/2021   Genetic testing 08/02/2021   [facility] CancerNext-Expanded Panel was  Negative. 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 Report date is 07/26/2021.     The CancerNext-Expanded gene panel offered by [facility] and includes sequencing, rearrangement, and RNA analysis for the following 77 genes: AIP, ALK, APC, ATM, AXIN2, BAP1, BARD1, BLM, BMPR1A, BRCA1, BRCA2, BRIP1, CDC73, CDH1, CDK4,    GERD (gastroesophageal reflux disease)    Hereditary and idiopathic neuropathy, unspecified 09/18/2022   History of breast cancer 09/18/2022   Hyperlipidemia    Hypokalemia 09/08/2022   Kyphosis    Left bundle branch block 07/03/2021   Low back pain 09/18/2022   Malignant neoplasm of overlapping sites of right female breast (HCC) 07/01/2021   Malignant neoplasm of upper-inner quadrant of right breast in female, 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   Menopausal symptoms 09/18/2022   Non-ST elevation (NSTEMI) myocardial infarction (HCC) 09/08/2022   Osteopenia of right thigh 07/03/2021   Other chronic pain 07/03/2021   PAC (premature atrial contraction) 06/17/2013   Pain in right knee 01/01/2018   Parageusia 07/03/2021   PAT (paroxysmal atrial tachycardia) 06/17/2013   Personal history of colonic polyps 07/03/2021   Plantar fasciitis of right foot    Preop cardiovascular exam 10/16/2017   Preoperative cardiovascular examination 10/16/2017   Primary localized osteoarthrosis of right shoulder 11/10/2017   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   Restless legs syndrome 07/03/2021   Right shoulder pain 01/20/2012    Past Surgical History:  Procedure Laterality Date   ABDOMINAL HYSTERECTOMY  bladder tac     BREAST BIOPSY Right 06/25/2021   BREAST LUMPECTOMY Right 07/09/2021   BREAST LUMPECTOMY WITH RADIOACTIVE SEED LOCALIZATION Right 07/09/2021   Procedure: RIGHT BREAST LUMPECTOMY WITH RADIOACTIVE SEED LOCALIZATION;  Surgeon: Manus Rudd, MD;  Location: [facility];  Service:  General;  Laterality: Right;   CARDIAC CATHETERIZATION     LEFT HEART CATH AND CORONARY ANGIOGRAPHY N/A 09/08/2022   Procedure: LEFT HEART CATH AND CORONARY ANGIOGRAPHY;  Surgeon: Yvonne Kendall, MD;  Location: [facility];  Service: Cardiovascular;  Laterality: N/A;   TONSILLECTOMY     TOTAL SHOULDER ARTHROPLASTY Right 11/10/2017   Procedure: TOTAL SHOULDER ARTHROPLASTY;  Surgeon: Teryl Lucy, MD;  Location: [facility] OR;  Service: Orthopedics;  Laterality: Right;    Current Medications: Current Meds  Medication Sig   acetaminophen (TYLENOL) 500 MG tablet Take 500 mg by mouth every 6 (six) hours as needed for moderate pain.   Ascorbic Acid (VITAMIN C) 1000 MG tablet Take 1,000 mg by mouth daily.   aspirin EC 81 MG tablet Take 81 mg by mouth See admin instructions. Take one tablet by mouth on Monday Wednesday and Fridays only per patient   Calcium Carb-Cholecalciferol (CALCIUM-VITAMIN D) 600-400 MG-UNIT TABS Take 1 tablet by mouth daily.   calcium carbonate (TUMS - DOSED IN MG ELEMENTAL CALCIUM) 500 MG chewable tablet Chew 1 tablet by mouth daily as needed for indigestion or heartburn.   Cholecalciferol 4000 units CAPS Take 4,000 Units by mouth daily.   dapagliflozin propanediol (FARXIGA) 10 MG TABS tablet Take 1 tablet (10 mg total) by mouth daily.   escitalopram (LEXAPRO) 10 MG tablet Take 10 mg by mouth every evening.    fish oil-omega-3 fatty acids 1000 MG capsule Take 1 g by mouth every other day.   Flaxseed, Linseed, (FLAXSEED OIL) 1000 MG CAPS Take 1,000 mg by mouth every other day.   furosemide (LASIX) 20 MG tablet Take 1 tablet (20 mg total) by mouth every Monday, Wednesday, and Friday.   gabapentin (NEURONTIN) 100 MG capsule Take 1 capsule (100 mg total) by mouth 4 (four) times daily.   LORazepam (ATIVAN) 0.5 MG tablet Take 0.5 mg by mouth every 6 (six) hours as needed for anxiety.   Magnesium 250 MG TABS Take 250 mg by mouth daily.    metoprolol (TOPROL-XL) 200 MG 24 hr  tablet Take 1 tablet (200 mg total) by mouth every evening.   omeprazole (PRILOSEC) 40 MG capsule Take 1 capsule (40 mg total) by mouth 2 (two) times daily.   pyridOXINE (VITAMIN B-6) 100 MG tablet Take 100 mg by mouth daily.   rosuvastatin (CRESTOR) 20 MG tablet Take 20 mg by mouth every evening.      Allergies:   Amlodipine, Benicar hct [olmesartan medoxomil-hctz], Codeine, Diovan [valsartan], Niacin and related, and Ropinirole hcl   Social History   Socioeconomic History   Marital status: Single    Spouse name: Not on file   Number of children: 1   Years of education: Not on file   Highest education level: Not on file  Occupational History   Occupation: Retired  Tobacco Use   Smoking status: Former    Current packs/day: 0.50    Average packs/day: 0.5 packs/day for 15.0 years (7.5 ttl pk-yrs)    Types: Cigarettes   Smokeless tobacco: Never  Vaping Use   Vaping status: Never Used  Substance and Sexual Activity   Alcohol use: Yes    Comment: ? daily drinker?  Drug use: No   Sexual activity: Not Currently  Other Topics Concern   Not on file  Social History Narrative   Not on file   Social Determinants of Health   Financial Resource Strain: Low Risk  (09/09/2022)   Overall Financial Resource Strain (CARDIA)    Difficulty of Paying Living Expenses: Not hard at all  Food Insecurity: No Food Insecurity (09/06/2022)   Hunger Vital Sign    Worried About Running Out of Food in the Last Year: Never true    Ran Out of Food in the Last Year: Never true  Transportation Needs: No Transportation Needs (09/06/2022)   PRAPARE - Administrator, Civil Service (Medical): No    Lack of Transportation (Non-Medical): No  Physical Activity: Not on file  Stress: Not on file  Social Connections: Not on file     Family History: The patient's family history includes Breast cancer in her cousin; Colon cancer in her father; Heart attack in her mother; Hyperlipidemia in her mother;  Hypertension in her mother; Prostate cancer in her father. There is no history of Sudden death or Diabetes. ROS:   Please see the history of present illness.    All 14 point review of systems negative except as described per history of present illness  EKGs/Labs/Other Studies Reviewed:         Recent Labs: 09/06/2022: B Natriuretic Peptide 446.7 09/07/2022: ALT 21 09/08/2022: Magnesium 2.2 09/09/2022: Hemoglobin 11.7; Platelets 149 09/23/2022: BUN 20; Creatinine, Ser 1.33; Potassium 4.7; Sodium 133  Recent Lipid Panel No results found for: "CHOL", "TRIG", "HDL", "CHOLHDL", "VLDL", "LDLCALC", "LDLDIRECT"  Physical Exam:    VS:  BP 132/74 (BP Location: Left Arm, Patient Position: Sitting)   Pulse (!) 48   Ht 5\' 4"  (1.626 m)   Wt 120 lb (54.4 kg)   SpO2 96%   BMI 20.60 kg/m     Wt Readings from Last 3 Encounters:  12/19/22 120 lb (54.4 kg)  12/16/22 121 lb 3.2 oz (55 kg)  11/11/22 119 lb (54 kg)     GEN:  Well nourished, well developed in no acute distress HEENT: Normal NECK: No JVD; No carotid bruits LYMPHATICS: No lymphadenopathy CARDIAC: RRR, no murmurs, no rubs, no gallops RESPIRATORY:  Clear to auscultation without rales, wheezing or rhonchi  ABDOMEN: Soft, non-tender, non-distended MUSCULOSKELETAL:  No edema; No deformity  SKIN: Warm and dry LOWER EXTREMITIES: no swelling NEUROLOGIC:  Alert and oriented x 3 PSYCHIATRIC:  Normal affect   ASSESSMENT:    1. Palpitations   2. Dilated cardiomyopathy (HCC)   3. Malignant neoplasm of overlapping sites of right breast in female, estrogen receptor positive (HCC)    PLAN:    In order of problems listed above:  Palpitations: Will ask her to wear Zio patch for 2 weeks to see exactly what can of arrhythmia if any with dealing with.  Her potassium has been checked recently and is normal. History of dilated cardiomyopathy with improvement we will continue present management. Breast CA.  Noted.  Followed by internal medicine  team and oncology   Medication Adjustments/Labs and Tests Ordered: Current medicines are reviewed at length with the patient today.  Concerns regarding medicines are outlined above.  Orders Placed This Encounter  Procedures   EKG 12-Lead   Medication changes: No orders of the defined types were placed in this encounter.   Signed, Georgeanna Lea, MD, [facility] 12/19/2022 4:08 PM     [facility]

## 2022-12-19 NOTE — Patient Instructions (Addendum)
Medication Instructions:  Your physician recommends that you continue on your current medications as directed. Please refer to the Current Medication list given to you today.  *If you need a refill on your cardiac medications before your next appointment, please call your pharmacy*   Lab Work: None Ordered If you have labs (blood work) drawn today and your tests are completely normal, you will receive your results only by: MyChart Message (if you have MyChart) OR A paper copy in the mail If you have any lab test that is abnormal or we need to change your treatment, we will call you to review the results.   Testing/Procedures:  WHY IS MY DOCTOR PRESCRIBING ZIO? The Zio system is proven and trusted by physicians to detect and diagnose irregular heart rhythms -- and has been prescribed to hundreds of thousands of patients.  The FDA has cleared the Zio system to monitor for many different kinds of irregular heart rhythms. In a study, physicians were able to reach a diagnosis 90% of the time with the Zio system1.  You can wear the Zio monitor -- a small, discreet, comfortable patch -- during your normal day-to-day activity, including while you sleep, shower, and exercise, while it records every single heartbeat for analysis.  1Barrett, P., et al. Comparison of 24 Hour Holter Monitoring Versus 14 Day Novel Adhesive Patch Electrocardiographic Monitoring. American Journal of Medicine, 2014.  ZIO VS. HOLTER MONITORING The Zio monitor can be comfortably worn for up to 14 days. Holter monitors can be worn for 24 to 48 hours, limiting the time to record any irregular heart rhythms you may have. Zio is able to capture data for the 51% of patients who have their first symptom-triggered arrhythmia after 48 hours.1  LIVE WITHOUT RESTRICTIONS The Zio ambulatory cardiac monitor is a small, unobtrusive, and water-resistant patch--you might even forget you're wearing it. The Zio monitor records and stores  every beat of your heart, whether you're sleeping, working out, or showering.     Follow-Up: At [facility], you and your health needs are our priority.  As part of our continuing mission to provide you with exceptional heart care, we have created designated Provider Care Teams.  These Care Teams include your primary Cardiologist (physician) and Advanced Practice Providers (APPs -  Physician Assistants and Nurse Practitioners) who all work together to provide you with the care you need, when you need it.  We recommend signing up for the patient portal called "MyChart".  Sign up information is provided on this After Visit Summary.  MyChart is used to connect with patients for Virtual Visits (Telemedicine).  Patients are able to view lab/test results, encounter notes, upcoming appointments, etc.  Non-urgent messages can be sent to your provider as well.   To learn more about what you can do with MyChart, go to [email or web address].    Your next appointment:   Keep previously scheduled appt  The format for your next appointment:   In Person  Provider:   Gypsy Balsam, MD    Other Instructions NA

## 2022-12-25 DIAGNOSIS — R1031 Right lower quadrant pain: Secondary | ICD-10-CM | POA: Diagnosis not present

## 2022-12-25 DIAGNOSIS — R1032 Left lower quadrant pain: Secondary | ICD-10-CM | POA: Diagnosis not present

## 2022-12-30 ENCOUNTER — Telehealth: Payer: Self-pay

## 2022-12-30 ENCOUNTER — Telehealth: Payer: Self-pay | Admitting: Cardiology

## 2022-12-30 NOTE — Telephone Encounter (Signed)
Duplicate request. Will remove from pre-op pool.   DW

## 2022-12-30 NOTE — Telephone Encounter (Signed)
Dr. Bing Matter,   You saw this patient on 12/19/2022. She complained of palpitations at that time and you ordered a 2 week Zio that has not yet been completed. Will you please comment on medical clearance for colonoscopy on 01/15/2023? Will she need to await heart monitor results.   Please route your response to P CV [facility] Preop. I will communicate with requesting office once you have given recommendations.   Thank you!  Carlos Levering, NP

## 2022-12-30 NOTE — Telephone Encounter (Signed)
Pre-op team,   Dr. Bing Matter would like to wait for patient's 2 week Zio to be resulted. It looks like she will wear it until around 8/9. Please inform patient and requesting office risk assessment cannot be completed until monitor is evaluated.   Thank you!  DW

## 2022-12-30 NOTE — Telephone Encounter (Signed)
Pts last OV was 12/19/22 with Dr. Bing Matter.      Pre-operative Risk Assessment    Patient Name: Jasmine Swanson  DOB: [birth year] MRN: [id number]      Request for Surgical Clearance    Procedure:   Colonoscopy  Date of Surgery:  Clearance 01/15/23                                 Surgeon:  Liliane Shi, MD Surgeon's Group or Practice Name:  [facility] Phone number:  [phone] Fax number:  [phone]   Type of Clearance Requested:   - Medical  - Pharmacy:  Hold Aspirin pt will need instructions on when/if to hold    Type of Anesthesia:   Propofol   Additional requests/questions:    Signed, Zada Finders   12/30/2022, 11:48 AM

## 2022-12-30 NOTE — Telephone Encounter (Signed)
   Pre-operative Risk Assessment    Patient Name: Jasmine Swanson  DOB: 1940/07/09 MRN: [id number]      Request for Surgical Clearance    Procedure:   Colonoscopy   Date of Surgery:  Clearance 01/15/23                                 Surgeon:  Liliane Shi  Surgeon's Group or Practice Name:  [facility]  Phone number:  [phone] Fax number:  [phone]   Type of Clearance Requested:   - Medical    Type of Anesthesia:  Not Indicated   Additional requests/questions:    SignedBelva Bertin   12/30/2022, 10:30 AM

## 2022-12-31 DIAGNOSIS — R002 Palpitations: Secondary | ICD-10-CM | POA: Diagnosis not present

## 2022-12-31 NOTE — Telephone Encounter (Signed)
Spoke with Jasmine December from requesting office and made her aware that clearance will be addressed once patient is done wearing zio

## 2023-01-02 DIAGNOSIS — R1030 Lower abdominal pain, unspecified: Secondary | ICD-10-CM | POA: Diagnosis not present

## 2023-01-08 ENCOUNTER — Telehealth: Payer: Self-pay | Admitting: Cardiology

## 2023-01-08 NOTE — Telephone Encounter (Signed)
Patient is calling stating that she hasn't had any heart irregularities since 12/19/2022 when she turned her monitored in and feels like this is something that Dr. Bing Matter should be made aware of.

## 2023-01-14 NOTE — Telephone Encounter (Signed)
Pt is aware her procedure will need to be held off until she has completed the heart monitor that Dr. Bing Matter has ordered for her.   Once Dr. Bing Matter has cleared the pt; our office will be sure to fax the clearance notes to requesting office.

## 2023-01-14 NOTE — Telephone Encounter (Signed)
   Patient Name: Jasmine Swanson  DOB: 02-07-1941 MRN: [id number]  Primary Cardiologist: Norman Herrlich, MD  Chart reviewed as part of pre-operative protocol coverage. Given past medical history and time since last visit, based on ACC/AHA guidelines, Jasmine Swanson is at acceptable risk for the planned procedure without further cardiovascular testing.  ZIO monitor results reviewed and showed PVCs with no malignant arrhythmias or pauses.  Therefore patient is fine to proceed with scheduled procedure at this time.  Patient can hold low-dose aspirin 5 to 7 days prior to procedure and should restart postprocedure when surgically safe and guided by GI team.   I will route this recommendation to the requesting party via Epic fax function and remove from pre-op pool.  Please call with questions.  Napoleon Form, Leodis Rains, NP 01/14/2023, 8:50 AM

## 2023-01-14 NOTE — Telephone Encounter (Signed)
  Deboraha Sprang is calling back to follow up clearance. They would like to receive it today since procedure is tomorrow

## 2023-01-15 DIAGNOSIS — Z800 Family history of malignant neoplasm of digestive organs: Secondary | ICD-10-CM | POA: Diagnosis not present

## 2023-01-15 DIAGNOSIS — K640 First degree hemorrhoids: Secondary | ICD-10-CM | POA: Diagnosis not present

## 2023-01-15 DIAGNOSIS — Z86010 Personal history of colonic polyps: Secondary | ICD-10-CM | POA: Diagnosis not present

## 2023-01-15 DIAGNOSIS — K5730 Diverticulosis of large intestine without perforation or abscess without bleeding: Secondary | ICD-10-CM | POA: Diagnosis not present

## 2023-01-15 DIAGNOSIS — Z09 Encounter for follow-up examination after completed treatment for conditions other than malignant neoplasm: Secondary | ICD-10-CM | POA: Diagnosis not present

## 2023-02-04 ENCOUNTER — Ambulatory Visit: Payer: Medicare Other | Admitting: Cardiology

## 2023-02-04 ENCOUNTER — Encounter: Payer: Self-pay | Admitting: Cardiology

## 2023-02-04 ENCOUNTER — Ambulatory Visit: Payer: Medicare Other | Attending: Cardiology | Admitting: Cardiology

## 2023-02-04 VITALS — BP 150/70 | HR 84 | Ht 63.0 in | Wt 122.0 lb

## 2023-02-04 DIAGNOSIS — I4729 Other ventricular tachycardia: Secondary | ICD-10-CM | POA: Insufficient documentation

## 2023-02-04 DIAGNOSIS — R002 Palpitations: Secondary | ICD-10-CM | POA: Diagnosis not present

## 2023-02-04 DIAGNOSIS — I420 Dilated cardiomyopathy: Secondary | ICD-10-CM | POA: Diagnosis not present

## 2023-02-04 NOTE — Patient Instructions (Signed)
Medication Instructions:  Your physician recommends that you continue on your current medications as directed. Please refer to the Current Medication list given to you today.  *If you need a refill on your cardiac medications before your next appointment, please call your pharmacy*   Lab Work: None Ordered If you have labs (blood work) drawn today and your tests are completely normal, you will receive your results only by: MyChart Message (if you have MyChart) OR A paper copy in the mail If you have any lab test that is abnormal or we need to change your treatment, we will call you to review the results.   Testing/Procedures: None Ordered   Follow-Up: At [facility], you and your health needs are our priority.  As part of our continuing mission to provide you with exceptional heart care, we have created designated Provider Care Teams.  These Care Teams include your primary Cardiologist (physician) and Advanced Practice Providers (APPs -  Physician Assistants and Nurse Practitioners) who all work together to provide you with the care you need, when you need it.  We recommend signing up for the patient portal called "MyChart".  Sign up information is provided on this After Visit Summary.  MyChart is used to connect with patients for Virtual Visits (Telemedicine).  Patients are able to view lab/test results, encounter notes, upcoming appointments, etc.  Non-urgent messages can be sent to your provider as well.   To learn more about what you can do with MyChart, go to [email or web address].    Your next appointment:   5 month(s)  The format for your next appointment:   In Person  Provider:   Robert Krasowski, MD    Other Instructions NA  

## 2023-02-04 NOTE — Progress Notes (Signed)
Cardiology Office Note:    Date:  02/04/2023   ID:  [name], DOB 09/20/1940, MRN [id number]  PCP:  Gweneth Dimitri, MD  Cardiologist:  Gypsy Balsam, MD    Referring MD: Gweneth Dimitri, MD   Chief Complaint  Patient presents with   Follow-up    History of Present Illness:    Jasmine Swanson is a 82 y.o. female with past medical history significant for cardiomyopathy ejection fraction 30 to 35%, cardiac catheterization showed normal coronaries then she was put on guideline directed medical therapy improved to normalization, she did have history of swelling with ACE inhibitor's no ACE inhibitor no ARB no Entresto in spite of that her ejection fraction normalized.  Recently she started complaining of having some palpitations monitor was placed she was found to have PVCs and she has symptoms during PVCs.  Also surprisingly nonsustained ventricular tachycardia which completely asymptomatic.  Today she comes to my office doing great asymptomatic active and have no difficulty doing things.  Past Medical History:  Diagnosis Date   Acute pulmonary edema (HCC) 09/06/2022   Acute respiratory failure with hypoxia (HCC) 09/06/2022   Acute systolic CHF (congestive heart failure) (HCC) 09/08/2022   Anxiety    Arthritis    Basal cell carcinoma 07/03/2021   Bundle branch block, left    Cancer (HCC)    Complication of anesthesia    laryngeal spams 50 yrs ago   DCM (dilated cardiomyopathy) (HCC) 09/08/2022   Depression    DNR (do not resuscitate) 09/06/2022   Dysphagia    Elevated troponin 09/06/2022   Essential hypertension 06/17/2013   Gastro-esophageal reflux disease without esophagitis 07/03/2021   Generalized anxiety disorder 07/03/2021   Genetic testing 08/02/2021   [facility] CancerNext-Expanded Panel was Negative. 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 Report date is 07/26/2021.     The CancerNext-Expanded gene panel offered by [facility] and includes sequencing, rearrangement, and RNA analysis for the following 77 genes: AIP, ALK, APC, ATM, AXIN2, BAP1, BARD1, BLM, BMPR1A, BRCA1, BRCA2, BRIP1, CDC73, CDH1, CDK4,    GERD (gastroesophageal reflux disease)    Hereditary and idiopathic neuropathy, unspecified 09/18/2022   History of breast cancer 09/18/2022   Hyperlipidemia    Hypokalemia 09/08/2022   Kyphosis    Left bundle branch block 07/03/2021   Low back pain 09/18/2022   Malignant neoplasm of overlapping sites of right female breast (HCC) 07/01/2021   Malignant neoplasm of upper-inner quadrant of right breast in female, 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   Menopausal symptoms 09/18/2022   Non-ST elevation (NSTEMI) myocardial infarction (HCC) 09/08/2022   Osteopenia of right thigh 07/03/2021   Other chronic pain 07/03/2021   PAC (premature atrial contraction) 06/17/2013   Pain in right knee 01/01/2018   Parageusia 07/03/2021   PAT (paroxysmal atrial tachycardia) 06/17/2013   Personal history of colonic polyps 07/03/2021   Plantar fasciitis of right foot    Preop cardiovascular exam 10/16/2017   Preoperative cardiovascular examination 10/16/2017   Primary localized osteoarthrosis of right shoulder 11/10/2017   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   Restless legs syndrome 07/03/2021   Right shoulder pain 01/20/2012    Past Surgical History:  Procedure Laterality Date   ABDOMINAL HYSTERECTOMY     bladder tac     BREAST BIOPSY Right 06/25/2021   BREAST LUMPECTOMY Right 07/09/2021   BREAST LUMPECTOMY WITH RADIOACTIVE SEED LOCALIZATION Right 07/09/2021   Procedure: RIGHT BREAST LUMPECTOMY WITH RADIOACTIVE SEED  LOCALIZATION;  Surgeon: Manus Rudd, MD;  Location: [facility];  Service: General;  Laterality: Right;   CARDIAC CATHETERIZATION     LEFT HEART CATH AND CORONARY ANGIOGRAPHY N/A 09/08/2022   Procedure: LEFT HEART CATH AND CORONARY ANGIOGRAPHY;  Surgeon:  Yvonne Kendall, MD;  Location: [facility];  Service: Cardiovascular;  Laterality: N/A;   TONSILLECTOMY     TOTAL SHOULDER ARTHROPLASTY Right 11/10/2017   Procedure: TOTAL SHOULDER ARTHROPLASTY;  Surgeon: Teryl Lucy, MD;  Location: [facility] OR;  Service: Orthopedics;  Laterality: Right;    Current Medications: Current Meds  Medication Sig   acetaminophen (TYLENOL) 500 MG tablet Take 500 mg by mouth every 6 (six) hours as needed for moderate pain.   Ascorbic Acid (VITAMIN C) 1000 MG tablet Take 1,000 mg by mouth daily.   aspirin EC 81 MG tablet Take 81 mg by mouth See admin instructions. Take one tablet by mouth on Monday Wednesday and Fridays only per patient   Calcium Carb-Cholecalciferol (CALCIUM-VITAMIN D) 600-400 MG-UNIT TABS Take 1 tablet by mouth daily.   calcium carbonate (TUMS - DOSED IN MG ELEMENTAL CALCIUM) 500 MG chewable tablet Chew 1 tablet by mouth daily as needed for indigestion or heartburn.   Cholecalciferol 4000 units CAPS Take 4,000 Units by mouth daily.   dapagliflozin propanediol (FARXIGA) 10 MG TABS tablet Take 1 tablet (10 mg total) by mouth daily.   escitalopram (LEXAPRO) 10 MG tablet Take 10 mg by mouth every evening.    fish oil-omega-3 fatty acids 1000 MG capsule Take 1 g by mouth every other day.   Flaxseed, Linseed, (FLAXSEED OIL) 1000 MG CAPS Take 1,000 mg by mouth every other day.   furosemide (LASIX) 20 MG tablet Take 1 tablet (20 mg total) by mouth every Monday, Wednesday, and Friday.   LORazepam (ATIVAN) 0.5 MG tablet Take 0.5 mg by mouth every 6 (six) hours as needed for anxiety.   Magnesium 250 MG TABS Take 250 mg by mouth daily.    metoprolol (TOPROL-XL) 200 MG 24 hr tablet Take 1 tablet (200 mg total) by mouth every evening.   omeprazole (PRILOSEC) 40 MG capsule Take 1 capsule (40 mg total) by mouth 2 (two) times daily.   pyridOXINE (VITAMIN B-6) 100 MG tablet Take 100 mg by mouth daily.   rosuvastatin (CRESTOR) 20 MG tablet Take 20 mg by mouth  every evening.      Allergies:   Amlodipine, Benicar hct [olmesartan medoxomil-hctz], Codeine, Diovan [valsartan], Niacin and related, and Ropinirole hcl   Social History   Socioeconomic History   Marital status: Single    Spouse name: Not on file   Number of children: 1   Years of education: Not on file   Highest education level: Not on file  Occupational History   Occupation: Retired  Tobacco Use   Smoking status: Former    Current packs/day: 0.50    Average packs/day: 0.5 packs/day for 15.0 years (7.5 ttl pk-yrs)    Types: Cigarettes   Smokeless tobacco: Never  Vaping Use   Vaping status: Never Used  Substance and Sexual Activity   Alcohol use: Yes    Comment: ? daily drinker?   Drug use: No   Sexual activity: Not Currently  Other Topics Concern   Not on file  Social History Narrative   Not on file   Social Determinants of Health   Financial Resource Strain: Low Risk  (09/09/2022)   Overall Financial Resource Strain (CARDIA)    Difficulty of  Paying Living Expenses: Not hard at all  Food Insecurity: No Food Insecurity (09/06/2022)   Hunger Vital Sign    Worried About Running Out of Food in the Last Year: Never true    Ran Out of Food in the Last Year: Never true  Transportation Needs: No Transportation Needs (09/06/2022)   PRAPARE - Administrator, Civil Service (Medical): No    Lack of Transportation (Non-Medical): No  Physical Activity: Not on file  Stress: Not on file  Social Connections: Not on file     Family History: The patient's family history includes Breast cancer in her cousin; Colon cancer in her father; Heart attack in her mother; Hyperlipidemia in her mother; Hypertension in her mother; Prostate cancer in her father. There is no history of Sudden death or Diabetes. ROS:   Please see the history of present illness.    All 14 point review of systems negative except as described per history of present illness  EKGs/Labs/Other Studies  Reviewed:         Recent Labs: 09/06/2022: B Natriuretic Peptide 446.7 09/07/2022: ALT 21 09/08/2022: Magnesium 2.2 09/09/2022: Hemoglobin 11.7; Platelets 149 09/23/2022: BUN 20; Creatinine, Ser 1.33; Potassium 4.7; Sodium 133  Recent Lipid Panel No results found for: "CHOL", "TRIG", "HDL", "CHOLHDL", "VLDL", "LDLCALC", "LDLDIRECT"  Physical Exam:    VS:  BP (!) 150/70 (BP Location: Left Arm, Patient Position: Sitting)   Pulse 84   Ht 5\' 3"  (1.6 m)   Wt 122 lb (55.3 kg)   SpO2 99%   BMI 21.61 kg/m     Wt Readings from Last 3 Encounters:  02/04/23 122 lb (55.3 kg)  12/19/22 120 lb (54.4 kg)  12/16/22 121 lb 3.2 oz (55 kg)     GEN:  Well nourished, well developed in no acute distress HEENT: Normal NECK: No JVD; No carotid bruits LYMPHATICS: No lymphadenopathy CARDIAC: RRR, no murmurs, no rubs, no gallops RESPIRATORY:  Clear to auscultation without rales, wheezing or rhonchi  ABDOMEN: Soft, non-tender, non-distended MUSCULOSKELETAL:  No edema; No deformity  SKIN: Warm and dry LOWER EXTREMITIES: no swelling NEUROLOGIC:  Alert and oriented x 3 PSYCHIATRIC:  Normal affect   ASSESSMENT:    1. Dilated cardiomyopathy (HCC)   2. Palpitations   3. Nonsustained ventricular tachycardia (HCC)    PLAN:    In order of problems listed above:  History of cardiomyopathy with normalization, this dilated cardiomyopathy not related to coronary disease since cardiac catheterization showed nonobstructive disease.  She had difficulty tolerating ACE inhibitor because of swelling so no ARB no Entresto.  But overall ejection fraction normalized and then they normal. Palpitations showed nonsustained ventricular tachycardia palpitations are gone no dizziness no passing out.  Echocardiogram preserved ejection fraction coronary artery absent so it is a low risk scenario continue present management. Nonsustained ventricular tachycardia discussion as above. Dyslipidemia I did review K PN which show  LDL at 69 HDL 51 continue present management   Medication Adjustments/Labs and Tests Ordered: Current medicines are reviewed at length with the patient today.  Concerns regarding medicines are outlined above.  No orders of the defined types were placed in this encounter.  Medication changes: No orders of the defined types were placed in this encounter.   Signed, Georgeanna Lea, MD, [facility] 02/04/2023 10:06 AM    [facility]

## 2023-02-05 IMAGING — MG MM DIGITAL SCREENING BILAT W/ TOMO AND CAD
8 series · 8 of 24 positions shown · non-contrast
Comparison: Previous exam(s).

CLINICAL DATA: Screening.

EXAM:
DIGITAL SCREENING BILATERAL MAMMOGRAM WITH TOMOSYNTHESIS AND CAD
TECHNIQUE: Bilateral screening digital craniocaudal and mediolateral oblique
mammograms were obtained. Bilateral screening digital breast
tomosynthesis was performed. The images were evaluated with
computer-aided detection.

[R CC synth-2D]
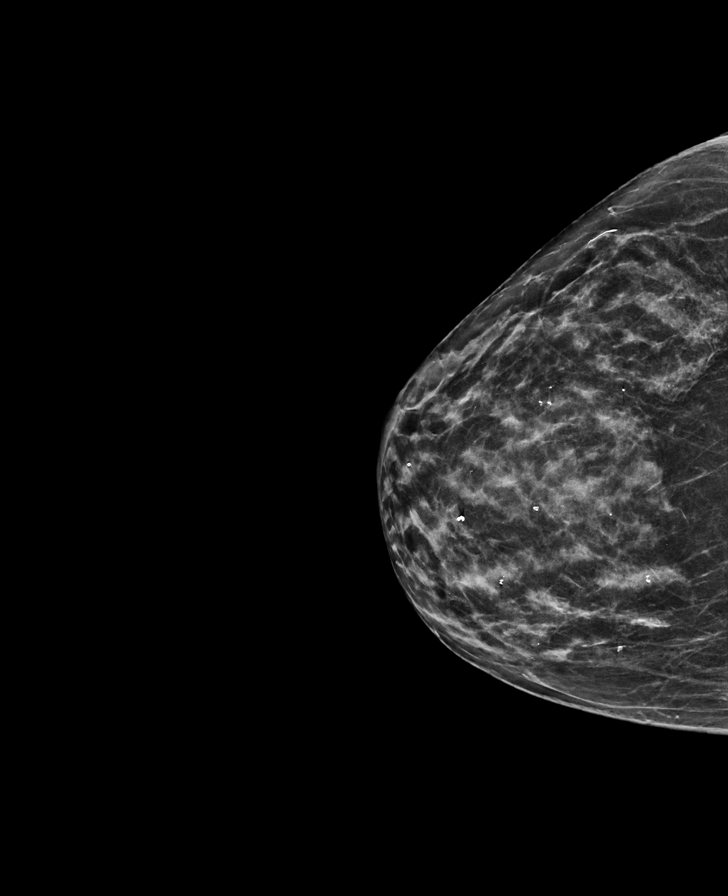

[L CC synth-2D]
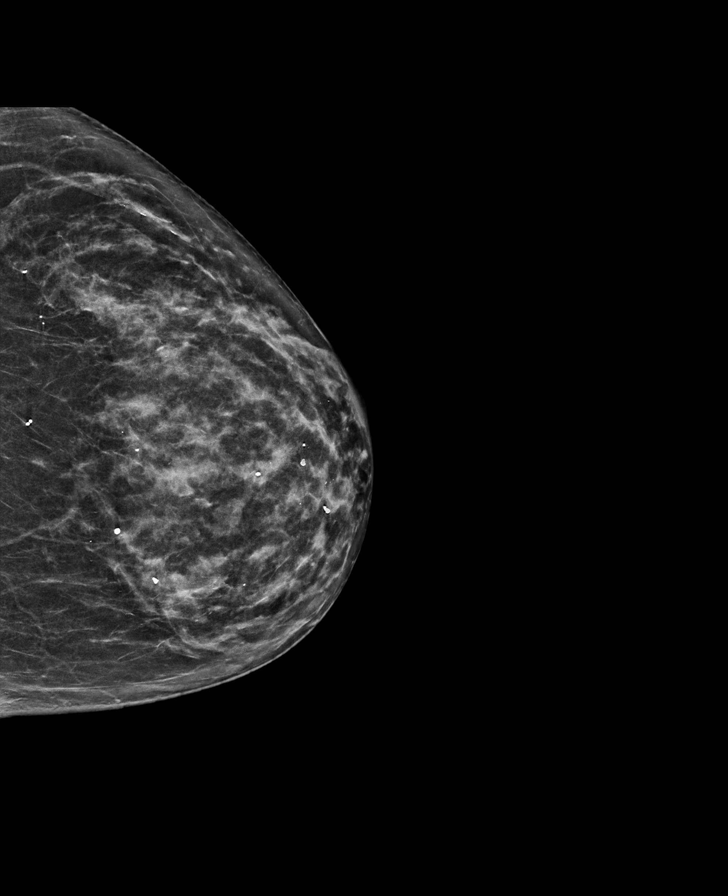

[L MLO synth-2D]
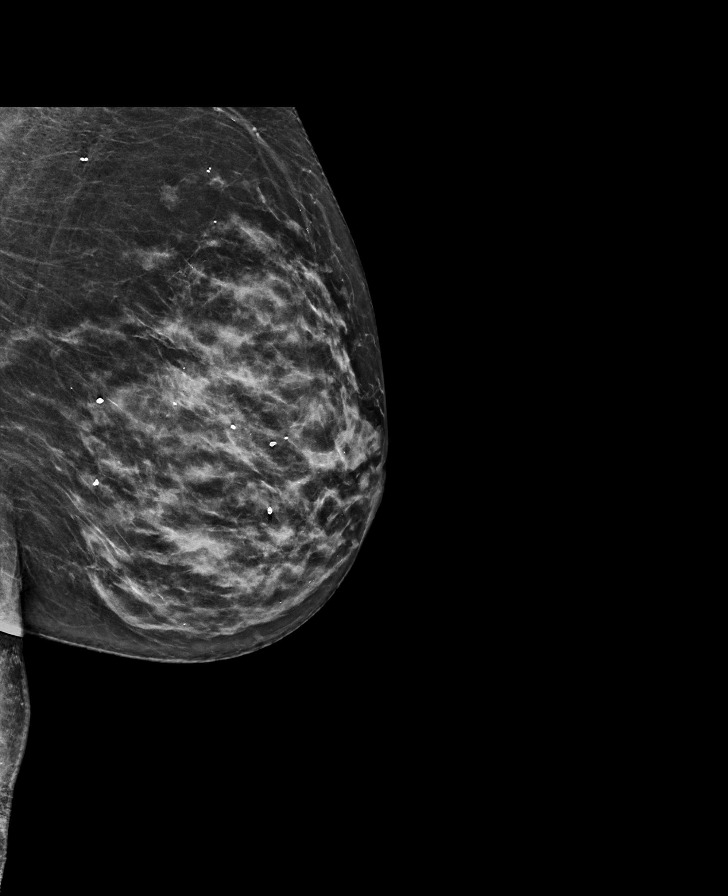

[R MLO synth-2D]
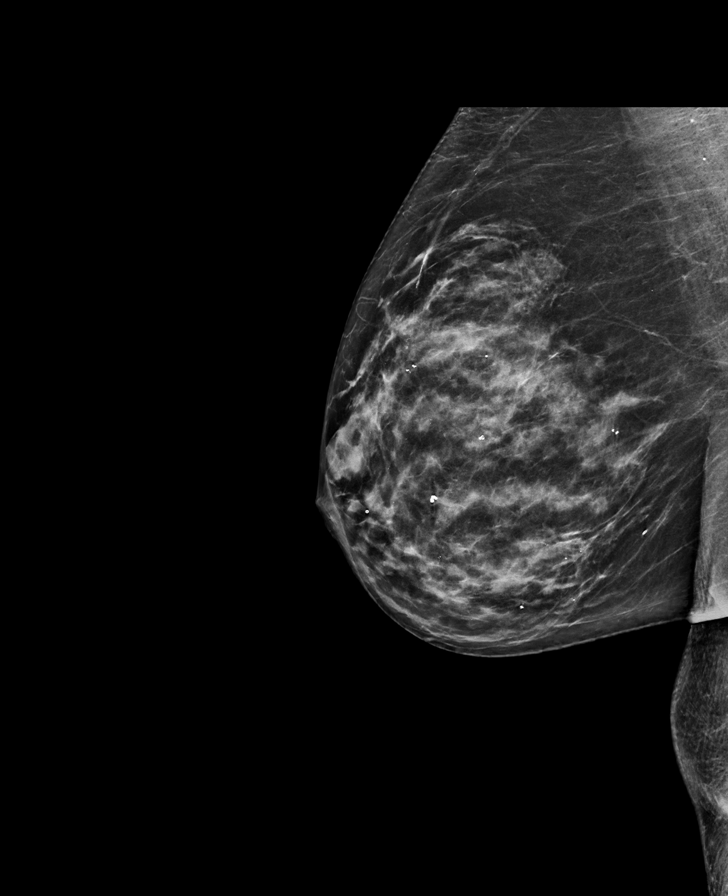

[R CC tomo · tomo slice 32/63.0]
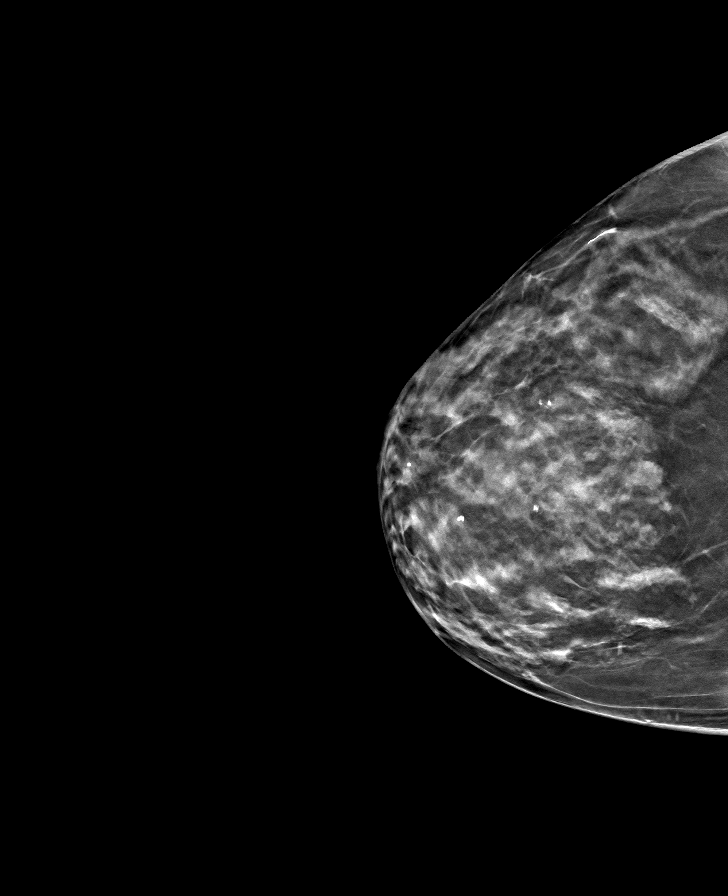

[R MLO tomo · tomo slice 35/69.0]
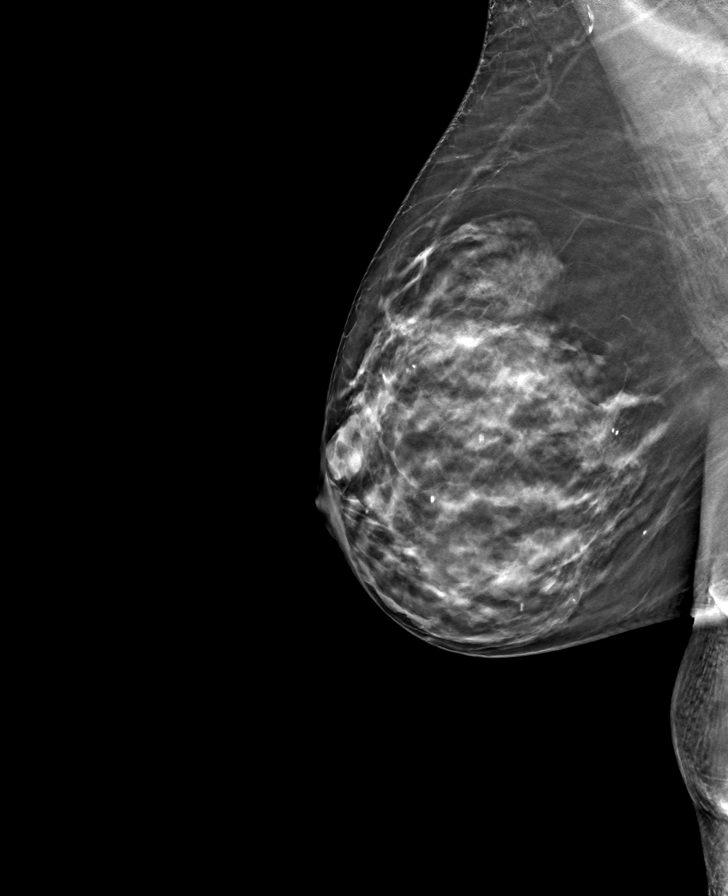

[L MLO tomo · tomo slice 33/66.0]
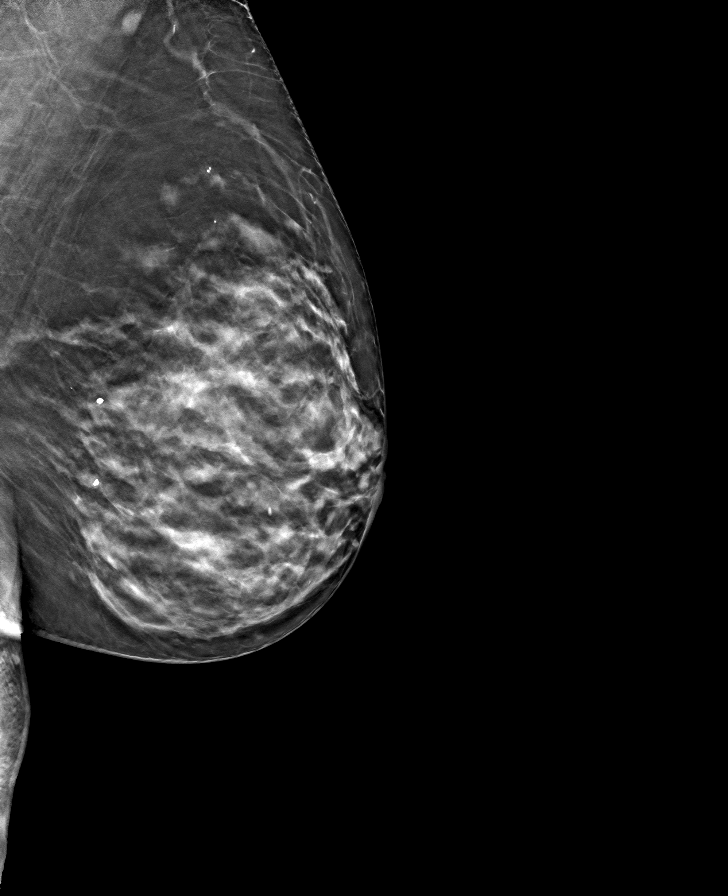

[L CC tomo · tomo slice 32/63.0]
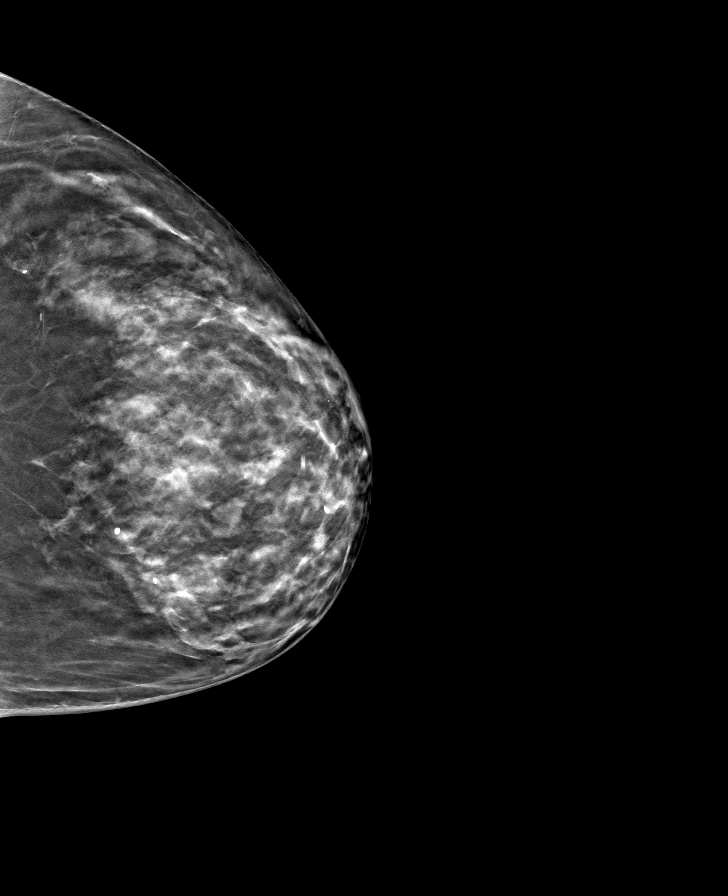

[8 of 24 positions shown; findings below may reference images not displayed]

ACR Breast Density Category d: The breast tissue is extremely dense,
which lowers the sensitivity of mammography.
FINDINGS: In the right breast, a possible asymmetry warrants further
evaluation. In the left breast, no findings suspicious for
malignancy.
IMPRESSION: Further evaluation is suggested for possible asymmetry in the right
breast.

RECOMMENDATION:
Diagnostic mammogram and possibly ultrasound of the right breast.
(Code:CR-G-IIX)

The patient will be contacted regarding the findings, and additional
imaging will be scheduled.

BI-RADS CATEGORY  0: Incomplete. Need additional imaging evaluation
and/or prior mammograms for comparison.

## 2023-04-02 DIAGNOSIS — Z1331 Encounter for screening for depression: Secondary | ICD-10-CM | POA: Diagnosis not present

## 2023-04-02 DIAGNOSIS — Z23 Encounter for immunization: Secondary | ICD-10-CM | POA: Diagnosis not present

## 2023-04-02 DIAGNOSIS — Z0000 Encounter for general adult medical examination without abnormal findings: Secondary | ICD-10-CM | POA: Diagnosis not present

## 2023-04-06 DIAGNOSIS — K080 Exfoliation of teeth due to systemic causes: Secondary | ICD-10-CM | POA: Diagnosis not present

## 2023-04-08 DIAGNOSIS — M85851 Other specified disorders of bone density and structure, right thigh: Secondary | ICD-10-CM | POA: Diagnosis not present

## 2023-04-08 DIAGNOSIS — E538 Deficiency of other specified B group vitamins: Secondary | ICD-10-CM | POA: Diagnosis not present

## 2023-04-08 DIAGNOSIS — Z8679 Personal history of other diseases of the circulatory system: Secondary | ICD-10-CM | POA: Diagnosis not present

## 2023-04-08 DIAGNOSIS — E782 Mixed hyperlipidemia: Secondary | ICD-10-CM | POA: Diagnosis not present

## 2023-04-08 DIAGNOSIS — I10 Essential (primary) hypertension: Secondary | ICD-10-CM | POA: Diagnosis not present

## 2023-04-08 DIAGNOSIS — I428 Other cardiomyopathies: Secondary | ICD-10-CM | POA: Diagnosis not present

## 2023-05-12 DIAGNOSIS — L570 Actinic keratosis: Secondary | ICD-10-CM | POA: Diagnosis not present

## 2023-05-12 DIAGNOSIS — L814 Other melanin hyperpigmentation: Secondary | ICD-10-CM | POA: Diagnosis not present

## 2023-05-12 DIAGNOSIS — C44319 Basal cell carcinoma of skin of other parts of face: Secondary | ICD-10-CM | POA: Diagnosis not present

## 2023-05-12 DIAGNOSIS — R1084 Generalized abdominal pain: Secondary | ICD-10-CM | POA: Diagnosis not present

## 2023-05-12 DIAGNOSIS — Z85828 Personal history of other malignant neoplasm of skin: Secondary | ICD-10-CM | POA: Diagnosis not present

## 2023-05-12 DIAGNOSIS — L821 Other seborrheic keratosis: Secondary | ICD-10-CM | POA: Diagnosis not present

## 2023-05-12 DIAGNOSIS — R194 Change in bowel habit: Secondary | ICD-10-CM | POA: Diagnosis not present

## 2023-06-17 ENCOUNTER — Other Ambulatory Visit: Payer: Self-pay | Admitting: Cardiology

## 2023-06-17 NOTE — Telephone Encounter (Signed)
Rx refill sent to pharmacy. 

## 2023-07-01 ENCOUNTER — Encounter: Payer: Self-pay | Admitting: Gastroenterology

## 2023-07-27 DIAGNOSIS — C44319 Basal cell carcinoma of skin of other parts of face: Secondary | ICD-10-CM | POA: Diagnosis not present

## 2023-07-27 DIAGNOSIS — Z85828 Personal history of other malignant neoplasm of skin: Secondary | ICD-10-CM | POA: Diagnosis not present

## 2023-07-31 DIAGNOSIS — J208 Acute bronchitis due to other specified organisms: Secondary | ICD-10-CM | POA: Diagnosis not present

## 2023-08-05 DIAGNOSIS — R058 Other specified cough: Secondary | ICD-10-CM | POA: Diagnosis not present

## 2023-08-05 DIAGNOSIS — H1033 Unspecified acute conjunctivitis, bilateral: Secondary | ICD-10-CM | POA: Diagnosis not present

## 2023-08-05 DIAGNOSIS — J209 Acute bronchitis, unspecified: Secondary | ICD-10-CM | POA: Diagnosis not present

## 2023-08-12 ENCOUNTER — Ambulatory Visit: Payer: Medicare Other | Admitting: Gastroenterology

## 2023-08-21 DIAGNOSIS — M545 Low back pain, unspecified: Secondary | ICD-10-CM | POA: Diagnosis not present

## 2023-08-21 DIAGNOSIS — M25551 Pain in right hip: Secondary | ICD-10-CM | POA: Diagnosis not present

## 2023-09-03 DIAGNOSIS — H52223 Regular astigmatism, bilateral: Secondary | ICD-10-CM | POA: Diagnosis not present

## 2023-09-07 ENCOUNTER — Ambulatory Visit: Payer: Medicare Other | Admitting: Gastroenterology

## 2023-09-14 ENCOUNTER — Ambulatory Visit
Admission: RE | Admit: 2023-09-14 | Discharge: 2023-09-14 | Disposition: A | Discharge status: Home / Self Care | Source: Ambulatory Visit | Attending: Nurse Practitioner | Admitting: Nurse Practitioner

## 2023-09-14 DIAGNOSIS — Z08 Encounter for follow-up examination after completed treatment for malignant neoplasm: Secondary | ICD-10-CM | POA: Diagnosis not present

## 2023-09-14 DIAGNOSIS — C50811 Malignant neoplasm of overlapping sites of right female breast: Secondary | ICD-10-CM

## 2023-09-14 DIAGNOSIS — Z853 Personal history of malignant neoplasm of breast: Secondary | ICD-10-CM | POA: Diagnosis not present

## 2023-09-16 ENCOUNTER — Other Ambulatory Visit: Payer: Self-pay

## 2023-09-16 ENCOUNTER — Observation Stay (HOSPITAL_COMMUNITY)
Admission: EM | Admit: 2023-09-16 | Discharge: 2023-09-17 | Disposition: A | Discharge status: Home / Self Care | Attending: Cardiology | Admitting: Cardiology

## 2023-09-16 ENCOUNTER — Encounter (HOSPITAL_COMMUNITY): Payer: Self-pay | Admitting: Emergency Medicine

## 2023-09-16 ENCOUNTER — Emergency Department (HOSPITAL_COMMUNITY)

## 2023-09-16 DIAGNOSIS — I5021 Acute systolic (congestive) heart failure: Secondary | ICD-10-CM

## 2023-09-16 DIAGNOSIS — I499 Cardiac arrhythmia, unspecified: Secondary | ICD-10-CM | POA: Diagnosis not present

## 2023-09-16 DIAGNOSIS — I5022 Chronic systolic (congestive) heart failure: Secondary | ICD-10-CM | POA: Insufficient documentation

## 2023-09-16 DIAGNOSIS — Z7984 Long term (current) use of oral hypoglycemic drugs: Secondary | ICD-10-CM | POA: Diagnosis not present

## 2023-09-16 DIAGNOSIS — I5A Non-ischemic myocardial injury (non-traumatic): Secondary | ICD-10-CM | POA: Insufficient documentation

## 2023-09-16 DIAGNOSIS — I160 Hypertensive urgency: Secondary | ICD-10-CM | POA: Diagnosis not present

## 2023-09-16 DIAGNOSIS — Z79899 Other long term (current) drug therapy: Secondary | ICD-10-CM | POA: Insufficient documentation

## 2023-09-16 DIAGNOSIS — I428 Other cardiomyopathies: Secondary | ICD-10-CM | POA: Insufficient documentation

## 2023-09-16 DIAGNOSIS — Z853 Personal history of malignant neoplasm of breast: Secondary | ICD-10-CM | POA: Insufficient documentation

## 2023-09-16 DIAGNOSIS — I4729 Other ventricular tachycardia: Secondary | ICD-10-CM | POA: Diagnosis present

## 2023-09-16 DIAGNOSIS — Z85828 Personal history of other malignant neoplasm of skin: Secondary | ICD-10-CM | POA: Insufficient documentation

## 2023-09-16 DIAGNOSIS — I471 Supraventricular tachycardia, unspecified: Principal | ICD-10-CM | POA: Insufficient documentation

## 2023-09-16 DIAGNOSIS — I2489 Other forms of acute ischemic heart disease: Secondary | ICD-10-CM | POA: Insufficient documentation

## 2023-09-16 DIAGNOSIS — I10 Essential (primary) hypertension: Secondary | ICD-10-CM | POA: Diagnosis not present

## 2023-09-16 DIAGNOSIS — R002 Palpitations: Secondary | ICD-10-CM | POA: Diagnosis not present

## 2023-09-16 DIAGNOSIS — I4510 Unspecified right bundle-branch block: Secondary | ICD-10-CM | POA: Diagnosis not present

## 2023-09-16 DIAGNOSIS — R410 Disorientation, unspecified: Secondary | ICD-10-CM | POA: Diagnosis not present

## 2023-09-16 DIAGNOSIS — Z87891 Personal history of nicotine dependence: Secondary | ICD-10-CM | POA: Insufficient documentation

## 2023-09-16 DIAGNOSIS — Z7982 Long term (current) use of aspirin: Secondary | ICD-10-CM | POA: Diagnosis not present

## 2023-09-16 DIAGNOSIS — R5383 Other fatigue: Secondary | ICD-10-CM | POA: Diagnosis not present

## 2023-09-16 DIAGNOSIS — R0602 Shortness of breath: Secondary | ICD-10-CM | POA: Diagnosis not present

## 2023-09-16 DIAGNOSIS — Z96611 Presence of right artificial shoulder joint: Secondary | ICD-10-CM | POA: Diagnosis not present

## 2023-09-16 DIAGNOSIS — R000 Tachycardia, unspecified: Secondary | ICD-10-CM | POA: Diagnosis not present

## 2023-09-16 LAB — CBC WITH DIFFERENTIAL/PLATELET
Abs Immature Granulocytes: 0.03 10*3/uL (ref 0.00–0.07)
Basophils Absolute: 0 10*3/uL (ref 0.0–0.1)
Basophils Relative: 0 %
Eosinophils Absolute: 0 10*3/uL (ref 0.0–0.5)
Eosinophils Relative: 0 %
HCT: 38.5 % (ref 36.0–46.0)
Hemoglobin: 13.3 g/dL (ref 12.0–15.0)
Immature Granulocytes: 0 %
Lymphocytes Relative: 15 %
Lymphs Abs: 1.1 10*3/uL (ref 0.7–4.0)
MCH: 31.1 pg (ref 26.0–34.0)
MCHC: 34.5 g/dL (ref 30.0–36.0)
MCV: 90 fL (ref 80.0–100.0)
Monocytes Absolute: 0.6 10*3/uL (ref 0.1–1.0)
Monocytes Relative: 8 %
Neutro Abs: 6 10*3/uL (ref 1.7–7.7)
Neutrophils Relative %: 77 %
Platelets: 166 10*3/uL (ref 150–400)
RBC: 4.28 MIL/uL (ref 3.87–5.11)
RDW: 12.8 % (ref 11.5–15.5)
WBC: 7.7 10*3/uL (ref 4.0–10.5)
nRBC: 0 % (ref 0.0–0.2)

## 2023-09-16 LAB — COMPREHENSIVE METABOLIC PANEL WITH GFR
ALT: 20 U/L (ref 0–44)
AST: 22 U/L (ref 15–41)
Albumin: 3.6 g/dL (ref 3.5–5.0)
Alkaline Phosphatase: 44 U/L (ref 38–126)
Anion gap: 12 (ref 5–15)
BUN: 9 mg/dL (ref 8–23)
CO2: 22 mmol/L (ref 22–32)
Calcium: 8.6 mg/dL — ABNORMAL LOW (ref 8.9–10.3)
Chloride: 101 mmol/L (ref 98–111)
Creatinine, Ser: 0.73 mg/dL (ref 0.44–1.00)
GFR, Estimated: >60 mL/min (ref >60)
Glucose, Bld: 102 mg/dL — ABNORMAL HIGH (ref 70–99)
Potassium: 3.4 mmol/L — ABNORMAL LOW (ref 3.5–5.1)
Sodium: 135 mmol/L (ref 135–145)
Total Bilirubin: 0.9 mg/dL (ref 0.0–1.2)
Total Protein: 6.1 g/dL — ABNORMAL LOW (ref 6.5–8.1)

## 2023-09-16 LAB — TROPONIN I (HIGH SENSITIVITY)
Troponin I (High Sensitivity): 19 ng/L — ABNORMAL HIGH (ref <18)
Troponin I (High Sensitivity): 55 ng/L — ABNORMAL HIGH (ref <18)
Troponin I (High Sensitivity): 57 ng/L — ABNORMAL HIGH (ref <18)

## 2023-09-16 LAB — TSH: TSH: 1.162 u[IU]/mL (ref 0.350–4.500)

## 2023-09-16 LAB — MAGNESIUM: Magnesium: 1.5 mg/dL — ABNORMAL LOW (ref 1.7–2.4)

## 2023-09-16 MED ORDER — METOPROLOL TARTRATE 25 MG PO TABS
50.0000 mg | ORAL_TABLET | Freq: Once | ORAL | Status: AC
  Administered 2023-09-16: 50 mg via ORAL
  Filled 2023-09-16: qty 2

## 2023-09-16 MED ORDER — MAGNESIUM SULFATE 2 GM/50ML IV SOLN
2.0000 g | Freq: Once | INTRAVENOUS | Status: AC
  Administered 2023-09-16: 2 g via INTRAVENOUS
  Filled 2023-09-16: qty 50

## 2023-09-16 MED ORDER — POTASSIUM CHLORIDE 20 MEQ PO PACK: 40.0000 meq | PACK | Freq: Two times a day (BID) | ORAL | Status: DC

## 2023-09-16 MED ORDER — POTASSIUM CHLORIDE 20 MEQ PO PACK
40.0000 meq | PACK | Freq: Once | ORAL | Status: AC
  Administered 2023-09-16: 40 meq via ORAL
  Filled 2023-09-16: qty 2

## 2023-09-16 MED ORDER — SODIUM CHLORIDE 0.9 % IV BOLUS
500.0000 mL | Freq: Once | INTRAVENOUS | Status: AC
  Administered 2023-09-16: 500 mL via INTRAVENOUS

## 2023-09-16 MED ORDER — METOPROLOL TARTRATE 5 MG/5ML IV SOLN
5.0000 mg | Freq: Once | INTRAVENOUS | Status: AC
  Administered 2023-09-16: 5 mg via INTRAVENOUS
  Filled 2023-09-16: qty 5

## 2023-09-16 NOTE — ED Triage Notes (Signed)
 Palpations x 3 days, sent by urgent care

## 2023-09-16 NOTE — ED Provider Notes (Signed)
 [place] EMERGENCY DEPARTMENT AT [facility] Provider Note   CSN: [id number] Arrival date & time: 09/16/23  1740     History  Chief Complaint  Patient presents with   Palpitations    Jasmine Swanson is a 83 y.o. female.  With a history of nonsustained VT, dilated cardiomyopathy and breast cancer in remission who presents to the ED for palpitations.  3 days of palpitations.  Reports increased psychologic stress and decreased p.o. intake secondary to the loss of a neighbor 1 week ago.  No chest pain shortness of breath or other systemic complaints at this time.  Is followed by [facility] with cardiology.  Last saw him in September 2020 for   Palpitations      Home Medications Prior to Admission medications   Medication Sig Start Date End Date Taking? Authorizing Provider  acetaminophen  (TYLENOL ) 500 MG tablet Take 500 mg by mouth every 6 (six) hours as needed for moderate pain.    [provider]  Ascorbic Acid (VITAMIN C ) 1000 MG tablet Take 1,000 mg by mouth daily.    [provider]  aspirin  EC 81 MG tablet Take 81 mg by mouth See admin instructions. Take one tablet by mouth on Monday Wednesday and Fridays only per patient    [provider]  Calcium  Carb-Cholecalciferol  (CALCIUM -VITAMIN D ) 600-400 MG-UNIT TABS Take 1 tablet by mouth daily.    [provider]  calcium  carbonate (TUMS - DOSED IN MG ELEMENTAL CALCIUM ) 500 MG chewable tablet Chew 1 tablet by mouth daily as needed for indigestion or heartburn.    [provider]  Cholecalciferol  4000 units CAPS Take 4,000 Units by mouth daily.    [provider]  escitalopram  (LEXAPRO ) 10 MG tablet Take 10 mg by mouth every evening.  04/08/13   [provider]  FARXIGA  10 MG TABS tablet Take 1 tablet by mouth once daily 06/17/23   Krasowski, Robert J, MD  fish oil-omega-3 fatty acids  1000 MG capsule Take 1 g by mouth every other day.    [provider]  Flaxseed, Linseed, (FLAXSEED OIL) 1000 MG CAPS Take 1,000 mg by mouth every other day.    [provider]  furosemide  (LASIX ) 20 MG tablet Take 1 tablet (20 mg total) by mouth every Monday, Wednesday, and Friday. 09/24/22   Clegg, Amy D, NP  LORazepam  (ATIVAN ) 0.5 MG tablet Take 0.5 mg by mouth every 6 (six) hours as needed for anxiety.    [provider]  Magnesium  250 MG TABS Take 250 mg by mouth daily.     [provider]  metoprolol  (TOPROL -XL) 200 MG 24 hr tablet Take 1 tablet (200 mg total) by mouth every evening. 09/24/22   Clegg, Amy D, NP  omeprazole  (PRILOSEC) 40 MG capsule Take 1 capsule (40 mg total) by mouth 2 (two) times daily. 09/24/22   Clegg, Amy D, NP  pyridOXINE (VITAMIN B-6) 100 MG tablet Take 100 mg by mouth daily.    [provider]  rosuvastatin  (CRESTOR ) 20 MG tablet Take 20 mg by mouth every evening.     [provider]      Allergies    Amlodipine, Benicar hct [olmesartan medoxomil-hctz], Codeine, Diovan [valsartan], Niacin and related, and Ropinirole hcl    Review of Systems   Review of Systems  Cardiovascular:  Positive for palpitations.    Physical Exam Updated Vital Signs BP (!) 147/68   Pulse 72   Temp 98.1 F (36.7 C) (Oral)  Resp 20   Ht 5\' 4"  (1.626 m)   Wt 51.7 kg   SpO2 100%   BMI 19.57 kg/m  Physical Exam Vitals and nursing note reviewed.  HENT:     Head: Normocephalic and atraumatic.  Eyes:     Pupils: Pupils are equal, round, and reactive to light.  Cardiovascular:     Rate and Rhythm: Regular rhythm. Tachycardia present.  Pulmonary:     Effort: Pulmonary effort is normal.     Breath sounds: Normal breath sounds.  Abdominal:     Palpations: Abdomen is soft.     Tenderness: There is no abdominal tenderness.  Skin:    General: Skin is warm and dry.  Neurological:     Mental Status: She is alert.  Psychiatric:        Mood and Affect: Mood normal.     ED Results / Procedures /  Treatments   Labs (all labs ordered are listed, but only abnormal results are displayed) Labs Reviewed  COMPREHENSIVE METABOLIC PANEL WITH GFR - Abnormal; Notable for the following components:      Result Value   Potassium 3.4 (*)    Glucose, Bld 102 (*)    Calcium  8.6 (*)    Total Protein 6.1 (*)    All other components within normal limits  MAGNESIUM  - Abnormal; Notable for the following components:   Magnesium  1.5 (*)    All other components within normal limits  TROPONIN I (HIGH SENSITIVITY) - Abnormal; Notable for the following components:   Troponin I (High Sensitivity) 19 (*)    All other components within normal limits  TROPONIN I (HIGH SENSITIVITY) - Abnormal; Notable for the following components:   Troponin I (High Sensitivity) 57 (*)    All other components within normal limits  TROPONIN I (HIGH SENSITIVITY) - Abnormal; Notable for the following components:   Troponin I (High Sensitivity) 55 (*)    All other components within normal limits  CBC WITH DIFFERENTIAL/PLATELET  TSH    EKG None  Radiology DG Chest Portable 1 View Result Date: 09/16/2023 CLINICAL DATA:  Shortness of breath and palpitations. EXAM: PORTABLE CHEST 1 VIEW COMPARISON:  September 06, 2022 FINDINGS: The heart size and mediastinal contours are within normal limits. Both lungs are clear. There is evidence of prior right shoulder arthroplasty. The visualized skeletal structures are otherwise unremarkable. IMPRESSION: No active disease. Electronically Signed   By: Virgle Grime M.D.   On: 09/16/2023 18:42    Procedures Procedures    Medications Ordered in ED Medications  sodium chloride  0.9 % bolus 500 mL (0 mLs Intravenous Stopped 09/16/23 1923)  metoprolol  tartrate (LOPRESSOR ) injection 5 mg (5 mg Intravenous Given 09/16/23 1828)  metoprolol  tartrate (LOPRESSOR ) tablet 50 mg (50 mg Oral Given 09/16/23 1925)  magnesium  sulfate IVPB 2 g 50 mL (0 g Intravenous Stopped 09/16/23 2210)  potassium  chloride (KLOR-CON ) packet 40 mEq (40 mEq Oral Given 09/16/23 2057)    ED Course/ Medical Decision Making/ A&P Clinical Course as of 09/16/23 2353  Wed Sep 16, 2023  1830 Significant improvement in her rate now in normal sinus rhythm after 5 mg IV metoprolol .  Labs show hypokalemia hypomanic which we will replete here.  Will give another dose of metoprolol  this time oral for her persistent hypertension and continue to monitor.  Initial troponin of 19 [MP]  2248 Troponin has up trended from 19-55.  I discussed this with cardiologist on-call (Dr Christena Covert) along with her EKGs and presentation earlier  today.  He will come evaluate her here in the ED pending further recommendations.  We are obtaining a third troponin.  She has received potassium and magnesium  repletion remains in normal sinus rhythm with a rate in the 60s and her blood pressure has improved with systolic blood pressure now in the 160s [MP]  2326 Third troponin flat at 57.  Patient remains in normal sinus rhythm with blood pressure systolic in the 160s and feels well.  Dr. Vira Grieves speaking with her now [MP]  2352 Dr. Vira Grieves has evaluated the patient in the ED.  We are not completely certain what that wide-complex tachycardia and her underlying rhythm was when she first came in.  For this reason cardiology service will admit and plan for echo in the morning and observe overnight on telemetry [MP]    Clinical Course User Index [MP] Sallyanne Creamer, DO                                 Medical Decision Making 83 year old female with history as above presenting for palpitations x 3 days.  Heart rate in 110s 120s and out of sinus tachycardia versus potential A-fib/a flutter.  Very hypertensive with systolic pressure above 200 to my assessment.  He reports compliance with her metoprolol .  Poor p.o. intake over the last 1 week with significant psychological stressors after the loss of her neighbor.  Differential diagnosis includes new  onset A-fib/a flutter, sinus tachycardia, electrolyte imbalance, thyroid  dysfunction or structural heart disease.  Will obtain cardiac workup provide IV fluids and try 5 mg IV metoprolol  as she is already on this medication for hypertension to see if that slows her rate and improves her BP  Amount and/or Complexity of Data Reviewed Labs: ordered. Radiology: ordered.  Risk Prescription drug management. Decision regarding hospitalization.           Final Clinical Impression(s) / ED Diagnoses Final diagnoses:  Palpitations  Tachycardia, unspecified    Rx / DC Orders ED Discharge Orders     None         Sallyanne Creamer, DO 09/16/23 2353

## 2023-09-16 NOTE — ED Notes (Signed)
 Per DO Penna, trop order duplicate order, no need for one right now considering one was just drawn.

## 2023-09-17 ENCOUNTER — Observation Stay (HOSPITAL_BASED_OUTPATIENT_CLINIC_OR_DEPARTMENT_OTHER)

## 2023-09-17 DIAGNOSIS — I4729 Other ventricular tachycardia: Secondary | ICD-10-CM | POA: Diagnosis not present

## 2023-09-17 DIAGNOSIS — I160 Hypertensive urgency: Secondary | ICD-10-CM

## 2023-09-17 DIAGNOSIS — R002 Palpitations: Secondary | ICD-10-CM | POA: Diagnosis not present

## 2023-09-17 DIAGNOSIS — I472 Ventricular tachycardia, unspecified: Secondary | ICD-10-CM | POA: Diagnosis not present

## 2023-09-17 DIAGNOSIS — I471 Supraventricular tachycardia, unspecified: Secondary | ICD-10-CM | POA: Diagnosis not present

## 2023-09-17 LAB — ECHOCARDIOGRAM COMPLETE
Area-P 1/2: 5.38 cm2
Height: 64 in
S' Lateral: 2.9 cm
Weight: 1824 [oz_av]

## 2023-09-17 LAB — BASIC METABOLIC PANEL WITH GFR
Anion gap: 9 (ref 5–15)
BUN: 9 mg/dL (ref 8–23)
CO2: 21 mmol/L — ABNORMAL LOW (ref 22–32)
Calcium: 8.4 mg/dL — ABNORMAL LOW (ref 8.9–10.3)
Chloride: 104 mmol/L (ref 98–111)
Creatinine, Ser: 0.7 mg/dL (ref 0.44–1.00)
GFR, Estimated: >60 mL/min (ref >60)
Glucose, Bld: 93 mg/dL (ref 70–99)
Potassium: 4.4 mmol/L (ref 3.5–5.1)
Sodium: 134 mmol/L — ABNORMAL LOW (ref 135–145)

## 2023-09-17 LAB — MAGNESIUM: Magnesium: 2.2 mg/dL (ref 1.7–2.4)

## 2023-09-17 MED ORDER — METOPROLOL SUCCINATE ER 100 MG PO TB24
100.0000 mg | ORAL_TABLET | Freq: Every day | ORAL | Status: DC
  Administered 2023-09-17: 100 mg via ORAL
  Filled 2023-09-17: qty 1

## 2023-09-17 MED ORDER — OYSTER SHELL CALCIUM/D3 500-5 MG-MCG PO TABS: 1.0000 | ORAL_TABLET | Freq: Every day | ORAL | Status: DC

## 2023-09-17 MED ORDER — METOPROLOL SUCCINATE ER 100 MG PO TB24: 100.0000 mg | ORAL_TABLET | Freq: Two times a day (BID) | ORAL | 2 refills | Status: AC

## 2023-09-17 MED ORDER — ROSUVASTATIN CALCIUM 20 MG PO TABS: 20.0000 mg | ORAL_TABLET | Freq: Every evening | ORAL | Status: DC

## 2023-09-17 MED ORDER — OMEGA-3-ACID ETHYL ESTERS 1 G PO CAPS: 1.0000 g | ORAL_CAPSULE | ORAL | Status: DC

## 2023-09-17 MED ORDER — DAPAGLIFLOZIN PROPANEDIOL 10 MG PO TABS: 10.0000 mg | ORAL_TABLET | Freq: Every day | ORAL | Status: DC

## 2023-09-17 MED ORDER — VITAMIN D 25 MCG (1000 UNIT) PO TABS
4000.0000 [IU] | ORAL_TABLET | Freq: Every day | ORAL | Status: DC
  Administered 2023-09-17: 4000 [IU] via ORAL
  Filled 2023-09-17: qty 4

## 2023-09-17 MED ORDER — ACETAMINOPHEN 500 MG PO TABS: 500.0000 mg | ORAL_TABLET | Freq: Four times a day (QID) | ORAL | Status: DC | PRN

## 2023-09-17 MED ORDER — ESCITALOPRAM OXALATE 10 MG PO TABS: 10.0000 mg | ORAL_TABLET | Freq: Every day | ORAL | Status: DC | PRN

## 2023-09-17 MED ORDER — MELATONIN 3 MG PO TABS
3.0000 mg | ORAL_TABLET | Freq: Every day | ORAL | Status: DC
  Administered 2023-09-17: 3 mg via ORAL
  Filled 2023-09-17: qty 1

## 2023-09-17 MED ORDER — METOPROLOL SUCCINATE ER 100 MG PO TB24: 100.0000 mg | ORAL_TABLET | Freq: Every day | ORAL | Status: DC

## 2023-09-17 MED ORDER — CALCIUM CARBONATE ANTACID 500 MG PO CHEW: 1.0000 | CHEWABLE_TABLET | Freq: Every day | ORAL | Status: DC | PRN

## 2023-09-17 MED ORDER — PANTOPRAZOLE SODIUM 40 MG PO TBEC
40.0000 mg | DELAYED_RELEASE_TABLET | Freq: Two times a day (BID) | ORAL | Status: DC
  Administered 2023-09-17: 40 mg via ORAL
  Filled 2023-09-17: qty 1

## 2023-09-17 MED ORDER — METOPROLOL SUCCINATE ER 25 MG PO TB24
200.0000 mg | ORAL_TABLET | Freq: Every evening | ORAL | Status: DC
Start: 2023-09-17 — End: 2023-09-17

## 2023-09-17 MED ORDER — MAGNESIUM OXIDE -MG SUPPLEMENT 400 (240 MG) MG PO TABS
200.0000 mg | ORAL_TABLET | Freq: Every day | ORAL | Status: DC
  Administered 2023-09-17: 200 mg via ORAL
  Filled 2023-09-17: qty 1

## 2023-09-17 NOTE — Discharge Summary (Incomplete)
 Discharge Summary    Patient ID: Jasmine Swanson MRN: [id number]; DOB: 1940/08/10  Admit date: 09/16/2023 Discharge date: 09/18/2023  PCP:  Helyn Lobstein, MD   [facility] Providers Cardiologist:  Zoe Hinds, MD   {   Discharge Diagnoses    Principal Problem:   NSVT (nonsustained ventricular tachycardia) [facility]) Active Problems:   Paroxysmal SVT (supraventricular tachycardia) (HCC)   Demand ischemia (HCC)   Non-ischemic cardiomyopathy (HCC)   Hypertensive urgency    Diagnostic Studies/Procedures   Echocardiogram 09/17/2023: Impressions:  1. Left ventricular ejection fraction, by estimation, is 55 to 60%. The  left ventricle has normal function. The left ventricle has no regional  wall motion abnormalities. There is mild concentric left ventricular  hypertrophy. Left ventricular diastolic  parameters are consistent with Grade I diastolic dysfunction (impaired  relaxation).   2. Right ventricular systolic function is normal. The right ventricular  size is normal.   3. Left atrial size was mildly dilated.   4. The mitral valve is degenerative. Trivial mitral valve regurgitation.  No evidence of mitral stenosis.   5. The aortic valve is tricuspid. There is mild calcification of the  aortic valve. Aortic valve regurgitation is trivial. Aortic valve  sclerosis/calcification is present, without any evidence of aortic  stenosis.   6. The inferior vena cava is normal in size with greater than 50%  respiratory variability, suggesting right atrial pressure of 3 mmHg.   Conclusion(s)/Recommendation(s): EF has recovered since last study.  _____________   History of Present Illness     Jasmine Swanson is a 83 y.o. female with normal coronaries on cardiac catheterization in 08/2022, non-ischemic cardiomyopathy/ chronic HFrEF with EF of 30-35% in 08/2022 but normalized to 55-60% in 09/2022, LBBB, hypertension, hyperlipidemia, GERD, and breast cancer who was presented  to the ED on 09/16/2023 for further evaluation of palpitations.   Patient has a history of non-ischemic cardiomyopathy. She was admitted in 08/2022 with flash pulmonary edema and volume overload. Echo showed LVEF of 30-35% with severe hypokinesis of the mid-to-apical inferoseptal, mid-to-apical anteroseptal, mid-to-apical inferior, apical anterior, and apical lateral walls as well as the apex. LHC at that time showed normal coronaries. She was started on Amiodarone and EF completed normalized. Last Echo in 09/2022 showed LVEF of 55-60% with normal wall motion and grade 2 diastolic dysfunction as well as mild MR. Monitor in 11/2022 showed 2 episodes of NSVT (longest episode 5 beats), and 2 episodes of SVT (longest episode 10 beats), as well as occasional PVC (burden 3.2%).   Patient reported she was in her usual stated of health until 3 days prior to admission when she suddenly developed the sensation of palpitations of her chest. She reported the palpitations will come and go and last for upwards of an hour a time. She initially assumed that the symptoms would resolve spontaneous; however, they persisted over the next several days. She presented to an Urgent Care and was instructed to go to the ED for further evaluation. She denied chest pain, shortness of breath, presyncope/ syncope, fevers, chills, sweats, abdominal symptoms, urinary symptoms, bleeding, swelling, weakness, or numbness. She denied any heavy caffeine use, tobacco use, significant alcohol use, or illicit drug use.  Upon arrival to the ED, she was markedly hypertensive with BP of 213/110 and tachycardic with heart rates in the 140s. EKG showed a wide complex tachycardia which looked like sinus tachycardia with aberrant conduction and frequent PVCs. Telemetry showed sinus rhythm with NSVT and short bursts fo SVT  as well. She was given a dose of IV Metoprolol  and responded well to this. CBC was normal. Na 135, K 3.4, Glucose 102, BUN 9, Cr 0.73. TSH  normal. High-sensitivity troponin 19 >> 55 >> 57.  Hospital Course     Consultants: None   Palpitations  Non-Sustained VT Paroxysmal SVT Patient presented with palpitations and was noted to be tachycardic. Initial EKG showed a  wide complex tachycardia which looked like sinus tachycardia with aberrant conduction and frequent PVCs. Telemetry showed sinus rhythm with NSVT and short bursts fo SVT as well. She was given a dose of IV Metoprolol  with improvement. Potassium and Magnesium  were low at 3.4 and 1.5, respectively. Both were repleted with improvement. Echo showed LVEF of 55-60% with normal wall motion and grade 1 diastolic dysfunction, normal RV function, and mild atrial enlargement. Home Toprol -XL was switched from 200mg  daily to 100mg  twice daily. Will order 2 week Zio monitor and outpatient cardiac MRI. Follow-up visit arranged. Recommend repeating a BMET and Magnesium  at this visit.   Demand Ischemia High-sensitivity troponin 19 >> 55 >> 57. Echo showed normal LV function and no regional wall motion abnormalities. LHC in 08/2022 showed normal coronaries. She denied any chest pain. Troponin elevation not consistent with ACS. Felt to be demand ischemia in setting of tachycardia and marked hypertension. No additional ischemic work-up necessary. Of note, patient was taking Aspirin  81mg  twice a week at home. Given normal coronaries one year ago and no history of CVA, advised patient that she can stop this.  Chronic HFrEF with Improved EF Non-Ischemic Cardiomyopathy LVEF as low as 30-35% in 08/2022 but has since normalized. Echo this admission showed LVEF of 55-60%. Euvolemic this admission with no signs/ symptoms of acute CHF. Continue home GDMT: Toprol -XL 100mg  twice daily and Farxiga  10mg  daily. Of note, she had not been taking Farxiga  on admission but could not remember why. She thinks she was told to stop this by PCP. Recommended restarting this unless there was a specific reason she was told to  stop this. Can consider adding Spironolactone  as an outpatient if BP remains elevated (previously has not tolerated multiple ARBs - Omesartan-HCTZ and Valsartan).    Hypertensive Urgency BP markedly elevated on arrival at 213/110. However, improved with doses of IV Metoprolol . BP subsequently a little labile. However, systolic BP in the 100s to 140s for several hours prior to discharge. Will discharge on Toprol -XL 100mg  twice daily. Can consider adding a Spironolactone  at outpatient follow-up if needed. Of note, it looks like she previously has not tolerated Amlodipine, Olmesartan-HCTZ, or Valsartan.  Patient seen and examined by Dr. Emmette Harms today and felt to be stable for discharge. Outpatient follow-up arranged. Medications as below.     Did the patient have an acute coronary syndrome (MI, NSTEMI, STEMI, etc) this admission?:  No.   The elevated Troponin was due to the acute medical illness (demand ischemia).         _____________  Discharge Vitals Blood pressure (!) 145/63, pulse 87, temperature 98.3 F (36.8 C), temperature source Oral, resp. rate 18, height 5\' 4"  (1.626 m), weight 51.7 kg, SpO2 97%.  Filed Weights   09/16/23 1745  Weight: 51.7 kg    Labs & Radiologic Studies    CBC Recent Labs    09/16/23 1750  WBC 7.7  NEUTROABS 6.0  HGB 13.3  HCT 38.5  MCV 90.0  PLT 166   Basic Metabolic Panel Recent Labs    16/10/96 1750 09/17/23 0641  NA 135 134*  K 3.4* 4.4  CL 101 104  CO2 22 21*  GLUCOSE 102* 93  BUN 9 9  CREATININE 0.73 0.70  CALCIUM  8.6* 8.4*  MG 1.5* 2.2   Liver Function Tests Recent Labs    09/16/23 1750  AST 22  ALT 20  ALKPHOS 44  BILITOT 0.9  PROT 6.1*  ALBUMIN 3.6   No results for input(s): "LIPASE", "AMYLASE" in the last 72 hours. High Sensitivity Troponin:   Recent Labs  Lab 09/16/23 1750 09/16/23 2104 09/16/23 2213  TROPONINIHS 19* 55* 57*    BNP Invalid input(s): "POCBNP" D-Dimer No results for input(s): "DDIMER" in the  last 72 hours. Hemoglobin A1C No results for input(s): "HGBA1C" in the last 72 hours. Fasting Lipid Panel No results for input(s): "CHOL", "HDL", "LDLCALC", "TRIG", "CHOLHDL", "LDLDIRECT" in the last 72 hours. Thyroid  Function Tests Recent Labs    09/16/23 1750  TSH 1.162   _____________  ECHOCARDIOGRAM COMPLETE Result Date: 09/17/2023    ECHOCARDIOGRAM REPORT   Patient Name:   Jasmine Swanson [facility] Date of Exam: 09/17/2023 Medical Rec #:  [id number]         Height:       64.0 in Accession #:    [id number]        Weight:       114.0 lb Date of Birth:  16-Aug-1940         BSA:          1.541 m Patient Age:    82 years          BP:           100/66 mmHg Patient Gender: F                 HR:           71 bpm. Exam Location:  Inpatient Procedure: 2D Echo (Both Spectral and Color Flow Doppler were utilized during            procedure). Indications:    ventricular tachycardia  History:        Patient has prior history of Echocardiogram examinations, most                 recent 10/15/2022. Cardiomyopathy; breast cancer.  Sonographer:    Dione Franks RDCS Referring Phys: [id number] Christena Covert  Sonographer Comments: Global longitudinal strain was attempted. IMPRESSIONS  1. Left ventricular ejection fraction, by estimation, is 55 to 60%. The left ventricle has normal function. The left ventricle has no regional wall motion abnormalities. There is mild concentric left ventricular hypertrophy. Left ventricular diastolic parameters are consistent with Grade I diastolic dysfunction (impaired relaxation).  2. Right ventricular systolic function is normal. The right ventricular size is normal.  3. Left atrial size was mildly dilated.  4. The mitral valve is degenerative. Trivial mitral valve regurgitation. No evidence of mitral stenosis.  5. The aortic valve is tricuspid. There is mild calcification of the aortic valve. Aortic valve regurgitation is trivial. Aortic valve sclerosis/calcification is present, without any  evidence of aortic stenosis.  6. The inferior vena cava is normal in size with greater than 50% respiratory variability, suggesting right atrial pressure of 3 mmHg. Conclusion(s)/Recommendation(s): EF has recovered since last study. FINDINGS  Left Ventricle: Left ventricular ejection fraction, by estimation, is 55 to 60%. The left ventricle has normal function. The left ventricle has no regional wall motion abnormalities. The left ventricular internal cavity size was normal in size. There is  mild concentric left ventricular  hypertrophy. Left ventricular diastolic parameters are consistent with Grade I diastolic dysfunction (impaired relaxation). Right Ventricle: The right ventricular size is normal. No increase in right ventricular wall thickness. Right ventricular systolic function is normal. Left Atrium: Left atrial size was mildly dilated. Right Atrium: Right atrial size was normal in size. Pericardium: There is no evidence of pericardial effusion. Mitral Valve: The mitral valve is degenerative in appearance. There is mild thickening of the mitral valve leaflet(s). There is mild calcification of the mitral valve leaflet(s). Mild mitral annular calcification. Trivial mitral valve regurgitation. No evidence of mitral valve stenosis. Tricuspid Valve: The tricuspid valve is normal in structure. Tricuspid valve regurgitation is trivial. No evidence of tricuspid stenosis. Aortic Valve: The aortic valve is tricuspid. There is mild calcification of the aortic valve. Aortic valve regurgitation is trivial. Aortic valve sclerosis/calcification is present, without any evidence of aortic stenosis. Pulmonic Valve: The pulmonic valve was normal in structure. Pulmonic valve regurgitation is not visualized. No evidence of pulmonic stenosis. Aorta: The aortic root is normal in size and structure. Venous: The inferior vena cava is normal in size with greater than 50% respiratory variability, suggesting right atrial pressure of 3  mmHg. IAS/Shunts: No atrial level shunt detected by color flow Doppler.  LEFT VENTRICLE PLAX 2D LVIDd:         4.30 cm   Diastology LVIDs:         2.90 cm   LV e' medial:    5.77 cm/s LV PW:         1.10 cm   LV E/e' medial:  17.3 LV IVS:        1.10 cm   LV e' lateral:   6.74 cm/s LVOT diam:     1.60 cm   LV E/e' lateral: 14.8 LV SV:         37 LV SV Index:   24 LVOT Area:     2.01 cm  RIGHT VENTRICLE             IVC RV Basal diam:  2.10 cm     IVC diam: 1.20 cm RV S prime:     15.20 cm/s TAPSE (M-mode): 2.4 cm LEFT ATRIUM             Index        RIGHT ATRIUM          Index LA diam:        3.80 cm 2.47 cm/m   RA Area:     7.61 cm LA Vol (A2C):   33.2 ml 21.55 ml/m  RA Volume:   13.10 ml 8.50 ml/m LA Vol (A4C):   37.4 ml 24.28 ml/m LA Biplane Vol: 35.2 ml 22.85 ml/m  AORTIC VALVE LVOT Vmax:   82.40 cm/s LVOT Vmean:  53.900 cm/s LVOT VTI:    0.182 m  AORTA Ao Root diam: 2.60 cm Ao Asc diam:  2.70 cm MITRAL VALVE MV Area (PHT): 5.38 cm     SHUNTS MV Decel Time: 141 msec     Systemic VTI:  0.18 m MV E velocity: 100.00 cm/s  Systemic Diam: 1.60 cm MV A velocity: 137.00 cm/s MV E/A ratio:  0.73 Jules Oar MD Electronically signed by Jules Oar MD Signature Date/Time: 09/17/2023/3:45:12 PM    Final    DG Chest Portable 1 View Result Date: 09/16/2023 CLINICAL DATA:  Shortness of breath and palpitations. EXAM: PORTABLE CHEST 1 VIEW COMPARISON:  September 06, 2022 FINDINGS: The heart size and mediastinal contours are  within normal limits. Both lungs are clear. There is evidence of prior right shoulder arthroplasty. The visualized skeletal structures are otherwise unremarkable. IMPRESSION: No active disease. Electronically Signed   By: Virgle Grime M.D.   On: 09/16/2023 18:42   MM DIAG BREAST TOMO BILATERAL Result Date: 09/14/2023 CLINICAL DATA:  83 year old female presenting for annual exam. History of right breast cancer status post lumpectomy in 2023. No new problems. EXAM: DIGITAL DIAGNOSTIC  BILATERAL MAMMOGRAM WITH TOMOSYNTHESIS AND CAD TECHNIQUE: Bilateral digital diagnostic mammography and breast tomosynthesis was performed. The images were evaluated with computer-aided detection. COMPARISON:  Previous exam(s). ACR Breast Density Category c: The breasts are heterogeneously dense, which may obscure small masses. FINDINGS: Right breast: A spot 2D magnification view of the lumpectomy site was performed in addition to standard views. There are stable postsurgical changes. No suspicious mass, distortion, or microcalcifications are identified to suggest presence of malignancy. Left breast: No suspicious mass, distortion, or microcalcifications are identified to suggest presence of malignancy. IMPRESSION: 1. Stable postsurgical changes in the right breast. 2. No mammographic evidence of malignancy bilaterally. RECOMMENDATION: As the patient is now over 2 years out from her lumpectomy, she may return to annual screening mammography in 1 year. Given her history of breast cancer, she remains eligible for annual diagnostic mammography, if preferred. I have discussed the findings and recommendations with the patient. If applicable, a reminder letter will be sent to the patient regarding the next appointment. BI-RADS CATEGORY  2: Benign. Electronically Signed   By: Allena Ito M.D.   On: 09/14/2023 15:03   Disposition   Pt is being discharged home today in good condition.  Follow-up Plans & Appointments     Follow-up Information     Terrance Ferretti, NP Follow up.   Specialty: Cardiology Why: Hospital follow-up with Cardiology scheduled for 10/09/2023 at 10:30am. Please arrive 15 minutes early for check-in. If this date/ time does not work for you, please call our office to reschedule. Contact information: [address] [place] [address] [phone]                Discharge Instructions     Diet - low sodium heart healthy   Complete by: As directed    Increase activity  slowly   Complete by: As directed         Discharge Medications   Allergies as of 09/17/2023       Reactions   Amlodipine Swelling   Benicar Hct [olmesartan Medoxomil-hctz] Other (See Comments)   Edema of lower extremities along with diffuse body rash   Codeine Other (See Comments)   Drops blood pressure   Diovan [valsartan] Other (See Comments)   Palpitations.     Niacin And Related Other (See Comments)   Turns patient red, makes her pass out   Ropinirole Hcl Other (See Comments)   "Felt like legs were on fire"        Medication List     STOP taking these medications    aspirin  EC 81 MG tablet       TAKE these medications    acetaminophen  500 MG tablet Commonly known as: TYLENOL  Take 500 mg by mouth every 6 (six) hours as needed for moderate pain.   calcium  carbonate 500 MG chewable tablet Commonly known as: TUMS - dosed in mg elemental calcium  Chew 1 tablet by mouth daily as needed for indigestion or heartburn.   Calcium -Vitamin D  600-400 MG-UNIT Tabs Take 1 tablet by mouth daily.  Cholecalciferol  100 MCG (4000 UT) Caps Take 4,000 Units by mouth daily.   escitalopram  10 MG tablet Commonly known as: LEXAPRO  Take 10 mg by mouth daily as needed (for anxiety).   Farxiga  10 MG Tabs tablet Generic drug: dapagliflozin  propanediol Take 1 tablet by mouth once daily   fish oil-omega-3 fatty acids  1000 MG capsule Take 1 g by mouth every other day.   Flaxseed Oil 1000 MG Caps Take 1,000 mg by mouth every other day.   LORazepam  0.5 MG tablet Commonly known as: ATIVAN  Take 0.5 mg by mouth every 6 (six) hours as needed for anxiety.   Magnesium  250 MG Tabs Take 250 mg by mouth daily.   metoprolol  succinate 100 MG 24 hr tablet Commonly known as: TOPROL -XL Take 1 tablet (100 mg total) by mouth in the morning and at bedtime. What changed:  medication strength how much to take when to take this   omeprazole  40 MG capsule Commonly known as:  PRILOSEC Take 1 capsule (40 mg total) by mouth 2 (two) times daily.   pyridOXINE 100 MG tablet Commonly known as: VITAMIN B6 Take 100 mg by mouth daily.   rosuvastatin  20 MG tablet Commonly known as: CRESTOR  Take 20 mg by mouth every evening.           Outstanding Labs/Studies   Repeat BMET and Magnesium  at follow-up visit.   Duration of Discharge Encounter: APP Time: 15 minutes   Signed, Hani Campusano E Haskel Dewalt, PA-C 09/18/2023, 6:15 AM

## 2023-09-17 NOTE — Progress Notes (Addendum)
 Progress Note  Patient Name: Jasmine Swanson Date of Encounter: 09/17/2023  Primary Cardiologist: Zoe Hinds, MD   Subjective   Patient seen and examined at her bedside.  Inpatient Medications    Scheduled Meds:  Calcium -Vitamin D   1 tablet Oral Daily   cholecalciferol   4,000 Units Oral Daily   fish oil-omega-3 fatty acids   1 g Oral QODAY   magnesium  oxide  200 mg Oral Daily   melatonin  3 mg Oral QHS   metoprolol  succinate  100 mg Oral Daily   metoprolol  succinate  100 mg Oral QHS   pantoprazole   40 mg Oral BID   rosuvastatin   20 mg Oral QPM   Continuous Infusions:  PRN Meds: acetaminophen , calcium  carbonate, escitalopram    Vital Signs    Vitals:   09/17/23 0615 09/17/23 0630 09/17/23 0645 09/17/23 0700  BP: (!) 120/45 (!) 128/45 (!) 125/39 (!) 115/41  Pulse:    71  Resp: 13 16 13 16   Temp:    98.4 F (36.9 C)  TempSrc:      SpO2:    99%  Weight:      Height:        Intake/Output Summary (Last 24 hours) at 09/17/2023 0932 Last data filed at 09/16/2023 2210 Gross per 24 hour  Intake 550.8 ml  Output --  Net 550.8 ml   Filed Weights   09/16/23 1745  Weight: 51.7 kg    Telemetry     - Personally Reviewed  ECG     - Personally Reviewed  Physical Exam     General: Comfortable Head: Atraumatic, normal size  Eyes: PEERLA, EOMI  Neck: Supple, normal JVD Cardiac: Normal S1, S2; RRR; no murmurs, rubs, or gallops Lungs: Clear to auscultation bilaterally Abd: Soft, nontender, no hepatomegaly  Ext: warm, no edema Musculoskeletal: No deformities, BUE and BLE strength normal and equal Skin: Warm and dry, no rashes   Neuro: Alert and oriented to person, place, time, and situation, CNII-XII grossly intact, no focal deficits  Psych: Normal mood and affect   Labs    Chemistry Recent Labs  Lab 09/16/23 1750 09/17/23 0641  NA 135 134*  K 3.4* 4.4  CL 101 104  CO2 22 21*  GLUCOSE 102* 93  BUN 9 9  CREATININE 0.73 0.70  CALCIUM  8.6* 8.4*   PROT 6.1*  --   ALBUMIN 3.6  --   AST 22  --   ALT 20  --   ALKPHOS 44  --   BILITOT 0.9  --   GFRNONAA >60 >60  ANIONGAP 12 9     Hematology Recent Labs  Lab 09/16/23 1750  WBC 7.7  RBC 4.28  HGB 13.3  HCT 38.5  MCV 90.0  MCH 31.1  MCHC 34.5  RDW 12.8  PLT 166    Cardiac EnzymesNo results for input(s): "TROPONINI" in the last 168 hours. No results for input(s): "TROPIPOC" in the last 168 hours.   BNPNo results for input(s): "BNP", "PROBNP" in the last 168 hours.   DDimer No results for input(s): "DDIMER" in the last 168 hours.   Radiology    DG Chest Portable 1 View Result Date: 09/16/2023 CLINICAL DATA:  Shortness of breath and palpitations. EXAM: PORTABLE CHEST 1 VIEW COMPARISON:  September 06, 2022 FINDINGS: The heart size and mediastinal contours are within normal limits. Both lungs are clear. There is evidence of prior right shoulder arthroplasty. The visualized skeletal structures are otherwise unremarkable. IMPRESSION: No active disease. Electronically Signed  By: Virgle Grime M.D.   On: 09/16/2023 18:42    Cardiac Studies   Echo   Patient Profile     83 y.o. female with hx of HFimpEF (EF = 55-60%), NSVT, breast CA and GERD.   Assessment & Plan    NSVT SVT Myocardial injury/mild elevated troponin  Heart failure with improved ejection fraction  Hx of Nonischemic cardiomyopathy   Clinically she is feeling better, review of tele with some PVCs, episodes of NSVT and SVT. Her echo is pending if normal can be discharged to home with monitor. If changes on echo will rule out ischemic disease.   With a normal echo also cMRI will also help us  as well - which might be considered in the inpatient prior to discharge.   Continue current medication regimen.    For questions or updates, please contact [facility] Please consult [email or web address] for contact info under Cardiology/STEMI.      Signed, Francisca Langenderfer, DO  09/17/2023, 9:32 AM

## 2023-09-17 NOTE — Progress Notes (Signed)
  Echocardiogram 2D Echocardiogram has been performed.  Jasmine Swanson 09/17/2023, 3:29 PM

## 2023-09-17 NOTE — H&P (Signed)
 Cardiology Admission History and Physical   Patient ID: BIRGIT NOWLING MRN: [id number]; DOB: 1941-04-28   Admission date: 09/16/2023  PCP:  Helyn Lobstein, MD   [facility] Providers Cardiologist:  Zoe Hinds, MD        Chief Complaint: Palpitations  Patient Profile:   SUSANNE BAUMGARNER is a 83 y.o. 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  History of Present Illness:   Ms. Jenison reports that 3 days ago she was in her usual state of health when she suddenly developed the sensation of palpitations in her chest.  The palpitations will come and go and lasts for upwards of an hour at a time.  She initially assumed that the symptoms will resolve spontaneously; however, they persisted over the next several days.  She presented to an urgent care and was instructed to come to the ED for further evaluation.  She denies all of the symptoms including chest pain, SOB, syncope, presyncope, fever, chills, sweats, abdominal symptoms, urinary symptoms, bleeding, swelling, weakness or numbness.  Her cardiac history is notable for NICM which was diagnosed 1 year ago.  She presented with flash pulmonary edema and volume overload during the hospitalization.  LHC was performed which showed no CAD.  Since that she is continued on GDMT and has had complete recovery of her EF.  In September 2024 during cardiology follow-up she did report having palpitations at that time.  She wore a Zio patch for 7 days which revealed 2 episodes of NSVT, 2 episodes of SVT and a 3.2% PVC burden.  She has not had the palpitations since that time until recently.  She denies heavy caffeine use, tobacco, significant alcohol, or illicit drug use.  In the ED her VS were afebrile, HR 144, BP 213/110, RR 19, satting 100% on RA.  Labs notable for potassium 3.4, creatinine 0.73, magnesium  1.5, normal CBC, and troponins 19 -> 55 -> 57.  The  initial ECG showed WCT.  On my review of telemetry, the patient has intermittent NSVT and intermittent runs of what appears to be SVT. CXR was WNL.  In the ED she was given 5 mg IV metoprolol , potassium, and magnesium  with improvement in her heart rates.  She is admitted to cardiology for observation.     Past Medical History:  Diagnosis Date   Acute pulmonary edema (HCC) 09/06/2022   Acute respiratory failure with hypoxia (HCC) 09/06/2022   Acute systolic CHF (congestive heart failure) (HCC) 09/08/2022   Anxiety    Arthritis    Basal cell carcinoma 07/03/2021   Bundle branch block, left    Cancer (HCC)    Complication of anesthesia    laryngeal spams 50 yrs ago   DCM (dilated cardiomyopathy) (HCC) 09/08/2022   Depression    DNR (do not resuscitate) 09/06/2022   Dysphagia    Elevated troponin 09/06/2022   Essential hypertension 06/17/2013   Gastro-esophageal reflux disease without esophagitis 07/03/2021   Generalized anxiety disorder 07/03/2021   Genetic testing 08/02/2021   [facility] CancerNext-Expanded Panel was Negative. 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 Report date is 07/26/2021.     The CancerNext-Expanded gene panel offered by [facility] and includes sequencing, rearrangement, and RNA analysis for the following 77 genes: AIP, ALK, APC, ATM, AXIN2, BAP1, BARD1, BLM, BMPR1A, BRCA1, BRCA2, BRIP1, CDC73, CDH1, CDK4,    GERD (gastroesophageal reflux disease)  Hereditary and idiopathic neuropathy, unspecified 09/18/2022   History of breast cancer 09/18/2022   Hyperlipidemia    Hypokalemia 09/08/2022   Kyphosis    Left bundle branch block 07/03/2021   Low back pain 09/18/2022   Malignant neoplasm of overlapping sites of right female breast (HCC) 07/01/2021   Malignant neoplasm of upper-inner quadrant of right breast in female, 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   Menopausal symptoms 09/18/2022   Non-ST elevation  (NSTEMI) myocardial infarction (HCC) 09/08/2022   Osteopenia of right thigh 07/03/2021   Other chronic pain 07/03/2021   PAC (premature atrial contraction) 06/17/2013   Pain in right knee 01/01/2018   Parageusia 07/03/2021   PAT (paroxysmal atrial tachycardia) (HCC) 06/17/2013   Personal history of colonic polyps 07/03/2021   Plantar fasciitis of right foot    Preop cardiovascular exam 10/16/2017   Preoperative cardiovascular examination 10/16/2017   Primary localized osteoarthrosis of right shoulder 11/10/2017   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   Restless legs syndrome 07/03/2021   Right shoulder pain 01/20/2012    Past Surgical History:  Procedure Laterality Date   ABDOMINAL HYSTERECTOMY     bladder tac     BREAST BIOPSY Right 06/25/2021   BREAST LUMPECTOMY Right 07/09/2021   BREAST LUMPECTOMY WITH RADIOACTIVE SEED LOCALIZATION Right 07/09/2021   Procedure: RIGHT BREAST LUMPECTOMY WITH RADIOACTIVE SEED LOCALIZATION;  Surgeon: Dareen Ebbing, MD;  Location:  SURGERY CENTER;  Service: General;  Laterality: Right;   CARDIAC CATHETERIZATION     LEFT HEART CATH AND CORONARY ANGIOGRAPHY N/A 09/08/2022   Procedure: LEFT HEART CATH AND CORONARY ANGIOGRAPHY;  Surgeon: Sammy Crisp, MD;  Location: [facility] IN[facility]AS[facility] CV [facility];  Service: Cardiovascular;  Laterality: N/A;   TONSILLECTOMY     TOTAL SHOULDER ARTHROPLASTY Right 11/10/2017   Procedure: TOTAL SHOULDER ARTHROPLASTY;  Surgeon: Osa Blase, MD;  Location: [facility] OR;  Service: Orthopedics;  Laterality: Right;     Medications Prior to Admission: Prior to Admission medications   Medication Sig Start Date End Date Taking? Authorizing Provider  acetaminophen  (TYLENOL ) 500 MG tablet Take 500 mg by mouth every 6 (six) hours as needed for moderate pain.   Yes [provider]  aspirin  EC 81 MG tablet Take 81 mg by mouth See admin instructions. Take one tablet by mouth on Monday Wednesday and Fridays  only per patient   Yes [provider]  Calcium  Carb-Cholecalciferol  (CALCIUM -VITAMIN D ) 600-400 MG-UNIT TABS Take 1 tablet by mouth daily.   Yes [provider]  Cholecalciferol  4000 units CAPS Take 4,000 Units by mouth daily.   Yes [provider]  escitalopram  (LEXAPRO ) 10 MG tablet Take 10 mg by mouth daily as needed (for anxiety). 04/08/13  Yes [provider]  fish oil-omega-3 fatty acids  1000 MG capsule Take 1 g by mouth every other day.   Yes [provider]  Flaxseed, Linseed, (FLAXSEED OIL) 1000 MG CAPS Take 1,000 mg by mouth every other day.   Yes [provider]  LORazepam  (ATIVAN ) 0.5 MG tablet Take 0.5 mg by mouth every 6 (six) hours as needed for anxiety.   Yes [provider]  Magnesium  250 MG TABS Take 250 mg by mouth daily.    Yes [provider]  metoprolol  (TOPROL -XL) 200 MG 24 hr tablet Take 1 tablet (200 mg total) by mouth every evening. 09/24/22  Yes Clegg, Amy D, NP  omeprazole  (PRILOSEC) 40 MG capsule Take 1 capsule (40 mg total) by mouth 2 (two) times daily. 09/24/22  Yes  Clegg, Amy D, NP  pyridOXINE (VITAMIN B-6) 100 MG tablet Take 100 mg by mouth daily.   Yes [provider]  calcium  carbonate (TUMS - DOSED IN MG ELEMENTAL CALCIUM ) 500 MG chewable tablet Chew 1 tablet by mouth daily as needed for indigestion or heartburn.    [provider]  FARXIGA  10 MG TABS tablet Take 1 tablet by mouth once daily Patient not taking: Reported on 09/17/2023 06/17/23   Krasowski, Robert J, MD  rosuvastatin  (CRESTOR ) 20 MG tablet Take 20 mg by mouth every evening.  Patient not taking: Reported on 09/17/2023    [provider]     Allergies:    Allergies  Allergen Reactions   Amlodipine Swelling   Benicar Hct [Olmesartan Medoxomil-Hctz] Other (See Comments)    Edema of lower extremities along with diffuse body rash   Codeine Other (See Comments)    Drops blood pressure   Diovan [Valsartan]  Other (See Comments)    Palpitations.     Niacin And Related Other (See Comments)    Turns patient red, makes her pass out   Ropinirole Hcl Other (See Comments)    "Felt like legs were on fire"    Social History:   Social History   Socioeconomic History   Marital status: Single    Spouse name: Not on file   Number of children: 1   Years of education: Not on file   Highest education level: Not on file  Occupational History   Occupation: Retired  Tobacco Use   Smoking status: Former    Current packs/day: 0.50    Average packs/day: 0.5 packs/day for 15.0 years (7.5 ttl pk-yrs)    Types: Cigarettes   Smokeless tobacco: Never  Vaping Use   Vaping status: Never Used  Substance and Sexual Activity   Alcohol use: Yes    Comment: ? daily drinker?   Drug use: No   Sexual activity: Not Currently  Other Topics Concern   Not on file  Social History Narrative   Not on file   Social Drivers of Health   Financial Resource Strain: Low Risk  (09/09/2022)   Overall Financial Resource Strain (CARDIA)    Difficulty of Paying Living Expenses: Not hard at all  Food Insecurity: No Food Insecurity (09/06/2022)   Hunger Vital Sign    Worried About Running Out of Food in the Last Year: Never true    Ran Out of Food in the Last Year: Never true  Transportation Needs: No Transportation Needs (09/06/2022)   PRAPARE - Administrator, Civil Service (Medical): No    Lack of Transportation (Non-Medical): No  Physical Activity: Not on file  Stress: Not on file  Social Connections: Not on file  Intimate Partner Violence: Not At Risk (09/06/2022)   Humiliation, Afraid, Rape, and Kick questionnaire    Fear of Current or Ex-Partner: No    Emotionally Abused: No    Physically Abused: No    Sexually Abused: No    Family History:   The patient's family history includes Breast cancer in her cousin; Colon cancer in her father; Heart attack in her mother; Hyperlipidemia in her mother;  Hypertension in her mother; Prostate cancer in her father. There is no history of Sudden death or Diabetes.    ROS:  Please see the history of present illness.  All other ROS reviewed and negative.     Physical Exam/Data:   Vitals:   09/17/23 0100 09/17/23 0115 09/17/23 0130 09/17/23  0145  BP: (!) 165/65 (!) 155/60 139/81 (!) 144/73  Pulse: 65 68 68 68  Resp: 12 16 17 16   Temp:      TempSrc:      SpO2: 100% 100% 100% 100%  Weight:      Height:        Intake/Output Summary (Last 24 hours) at 09/17/2023 0215 Last data filed at 09/16/2023 2210 Gross per 24 hour  Intake 550.8 ml  Output --  Net 550.8 ml      09/16/2023    5:45 PM 02/04/2023    9:56 AM 12/19/2022    3:54 PM  Last 3 Weights  Weight (lbs) 114 lb 122 lb 120 lb  Weight (kg) 51.71 kg 55.339 kg 54.432 kg     Body mass index is 19.57 kg/m.  General:  Well nourished, well developed, in no acute distress, very pleasant HEENT: normal, atraumatic, normocephalic Neck: no JVD Vascular: No carotid bruits; radial pulses 2+ bilaterally   Cardiac: RRR, S3 gallop heard, no murmurs or rubs Lungs:  clear to auscultation bilaterally, no wheezing, rhonchi or rales  Abd: soft, nontender, no hepatomegaly  Ext: no edema Musculoskeletal:  No deformities, BUE and BLE strength normal and equal Skin: warm and dry  Neuro:  CNs 2-12 intact, no focal abnormalities noted Psych:  Normal affect    EKG:    Initial Rhythm    Rhythm After Metoprolol     Relevant CV Studies:   TTE 10/15/22  IMPRESSIONS     1. Left ventricular ejection fraction, by estimation, is 55 to 60%. The  left ventricle has normal function. The left ventricle has no regional  wall motion abnormalities. Left ventricular diastolic parameters are  consistent with Grade II diastolic  dysfunction (pseudonormalization).   2. Right ventricular systolic function is normal. The right ventricular  size is normal.   3. Left atrial size was mildly dilated.   4.  The mitral valve is normal in structure. Mild mitral valve  regurgitation. No evidence of mitral stenosis.   5. The aortic valve is normal in structure. Aortic valve regurgitation is  not visualized. No aortic stenosis is present.   6. The inferior vena cava is normal in size with greater than 50%  respiratory variability, suggesting right atrial pressure of 3 mmHg.   LHC 09/08/22:  Conclusions: No angiographically significant coronary artery disease. Mildly elevated left ventricular filling pressure (LVEDP ~20 mmHg).   Recommendations: Continue escalation of goal-directed medical therapy for nonischemic cardiomyopathy. Consider gentle diuresis as renal function allows. Primary prevention of coronary artery disease.  Laboratory Data:  High Sensitivity Troponin:   Recent Labs  Lab 09/16/23 1750 09/16/23 2104 09/16/23 2213  TROPONINIHS 19* 55* 57*      Chemistry Recent Labs  Lab 09/16/23 1750  NA 135  K 3.4*  CL 101  CO2 22  GLUCOSE 102*  BUN 9  CREATININE 0.73  CALCIUM  8.6*  MG 1.5*  GFRNONAA >60  ANIONGAP 12    Recent Labs  Lab 09/16/23 1750  PROT 6.1*  ALBUMIN 3.6  AST 22  ALT 20  ALKPHOS 44  BILITOT 0.9   Lipids No results for input(s): "CHOL", "TRIG", "HDL", "LABVLDL", "LDLCALC", "CHOLHDL" in the last 168 hours. Hematology Recent Labs  Lab 09/16/23 1750  WBC 7.7  RBC 4.28  HGB 13.3  HCT 38.5  MCV 90.0  MCH 31.1  MCHC 34.5  RDW 12.8  PLT 166   Thyroid   Recent Labs  Lab 09/16/23 1750  TSH  1.162   BNPNo results for input(s): "BNP", "PROBNP" in the last 168 hours.  DDimer No results for input(s): "DDIMER" in the last 168 hours.   Radiology/Studies:  DG Chest Portable 1 View Result Date: 09/16/2023 CLINICAL DATA:  Shortness of breath and palpitations. EXAM: PORTABLE CHEST 1 VIEW COMPARISON:  September 06, 2022 FINDINGS: The heart size and mediastinal contours are within normal limits. Both lungs are clear. There is evidence of prior right  shoulder arthroplasty. The visualized skeletal structures are otherwise unremarkable. IMPRESSION: No active disease. Electronically Signed   By: Virgle Grime M.D.   On: 09/16/2023 18:42     Assessment and Plan:   MARIEANNE MARXEN is a 83 y.o. 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  #Palpitations #NSVT #SVT #Myocardial Injury :: Patient presenting with 3 days of frequent yet intermittent palpitations.  Her initial heart rates were in the 140s and her presenting rhythm is quite strange.  I am not entirely sure what her presenting rhythm was, but it appears that it is perhaps sinus tachycardia with aberrant conduction and some PVCs.  Telemetry demonstrates that she initially presented in this rhythm which responded to 5 mg of IV metoprolol .  Subsequent rhythms on telemetry demonstrate NSVT and short burst of SVT as well.  Her palpitations are certainly from some combination of all the aforementioned arrhythmias.  These were perhaps exacerbated by her electrolyte abnormalities driven by poor p.o. intake per the patient report.  Underlying structural heart disease is also on the differential given her history.  I heard an S3 present on her physical exam which makes me concerned for ongoing structural heart disease (an echo 1 year ago was largely normal).  Given her symptoms I would like to observe her overnight on telemetry to ensure that no more sustained arrhythmias develop.  I would also like to get an echo to ensure stability of her EF and cardiac function in the setting of auscultation S3 gallop.  Lastly, I believe her troponins represent demand in the setting of sustained tachycardia and not ACS. - Admit under observation - Maintain telemetry - Complete echo - Fractionate home metoprolol  succinate to metoprolol  succinate 100 mg twice daily for improved rate control  #HFimpEF #NICM - Not on ACEI/ARB due to  angioedema - Change metoprolol  as above - Was prescribed dapagliflozin  but is reportedly not taking it?  Will need to investigate why.    Risk Assessment/Risk Scores:          Code Status: Do Not Resuscitate (DNR) per patient wishes  Severity of Illness: The appropriate patient status for this patient is OBSERVATION. Observation status is judged to be reasonable and necessary in order to provide the required intensity of service to ensure the patient's safety. The patient's presenting symptoms, physical exam findings, and initial radiographic and laboratory data in the context of their medical condition is felt to place them at decreased risk for further clinical deterioration. Furthermore, it is anticipated that the patient will be medically stable for discharge from the hospital within 2 midnights of admission.    For questions or updates, please contact [facility] Please consult [email or web address] for contact info under     Signed, Christena Covert, MD  09/17/2023 2:15 AM

## 2023-09-17 NOTE — Progress Notes (Signed)
 Spoke with patient about her echo findings.  With normal echo she will benefit from a cMRI and zio monitor. She prefers to get the cMRI as an outpatient - this is not unreasonable so I will discharge her to home. With follow up

## 2023-09-17 NOTE — Plan of Care (Signed)
   Problem: Education: Goal: Knowledge of General Education information will improve Description: Including pain rating scale, medication(s)/side effects and non-pharmacologic comfort measures Outcome: Progressing   Problem: Clinical Measurements: Goal: Ability to maintain clinical measurements within normal limits will improve Outcome: Progressing Goal: Diagnostic test results will improve Outcome: Progressing

## 2023-09-18 ENCOUNTER — Other Ambulatory Visit: Payer: Self-pay | Admitting: Student

## 2023-09-18 ENCOUNTER — Ambulatory Visit: Attending: Student

## 2023-09-18 DIAGNOSIS — I4729 Other ventricular tachycardia: Secondary | ICD-10-CM

## 2023-09-18 DIAGNOSIS — I160 Hypertensive urgency: Secondary | ICD-10-CM | POA: Insufficient documentation

## 2023-09-18 DIAGNOSIS — I2489 Other forms of acute ischemic heart disease: Secondary | ICD-10-CM | POA: Insufficient documentation

## 2023-09-18 NOTE — Progress Notes (Unsigned)
 Enrolled patient for a 14 day Zio XT monitor to be mailed to patients home  Krasowski to read

## 2023-09-18 NOTE — Progress Notes (Signed)
 Ordered 2 week Zio monitor and cardiac MRI for further evaluation of NSVT at the request of Dr. Emmette Harms. Please see discharge summary from 09/17/2023 for more information.   Ladean Steinmeyer E Durga Saldarriaga, PA-C 09/18/2023 6:22 AM

## 2023-09-21 ENCOUNTER — Other Ambulatory Visit: Payer: Self-pay | Admitting: *Deleted

## 2023-09-23 DIAGNOSIS — M25551 Pain in right hip: Secondary | ICD-10-CM | POA: Diagnosis not present

## 2023-09-24 DIAGNOSIS — I4729 Other ventricular tachycardia: Secondary | ICD-10-CM | POA: Diagnosis not present

## 2023-09-24 DIAGNOSIS — F411 Generalized anxiety disorder: Secondary | ICD-10-CM | POA: Diagnosis not present

## 2023-09-24 DIAGNOSIS — I160 Hypertensive urgency: Secondary | ICD-10-CM | POA: Diagnosis not present

## 2023-09-25 DIAGNOSIS — M25551 Pain in right hip: Secondary | ICD-10-CM | POA: Diagnosis not present

## 2023-09-28 ENCOUNTER — Other Ambulatory Visit: Payer: Self-pay | Admitting: Cardiology

## 2023-10-05 DIAGNOSIS — K080 Exfoliation of teeth due to systemic causes: Secondary | ICD-10-CM | POA: Diagnosis not present

## 2023-10-07 NOTE — Progress Notes (Unsigned)
 Cardiology Office Note:  .   Date:  10/09/2023  ID:  Jhonny Moss, DOB 07/23/1940, MRN [id number] PCP: Helyn Lobstein, MD  [facility] Providers Cardiologist:  Ralene Burger, MD    History of Present Illness: .   UNBORN CARREON is a 83 y.o. female with a past medical history of nonischemic cardiomyopathy, NSVT, GERD, history of breast cancer, left bundle branch block.  09/17/2023 echo EF 55 to 60%, mild concentric LVH, grade 1 DD, trivial MR, mild calcification aortic valve, mild calcification of the aortic valve without stenosis 12/19/2022 monitor average heart rate 66 bpm, predominant rhythm sinus, intermittent bundle branch block, 2 episodes of VT occurred the longest 5 beats, 2 episodes of SVT, VE's were occasional at 3.2% 10/15/2022 echo EF 55 to 60%, grade 2 DD, mild MR 09/08/2022 cardiac cath no angiographically significant coronary artery disease 09/07/2022 echo EF 30 to 35%, positive RWMA, consistent with Takotsubo cardiomyopathy, mild concentric LVH, mild calcification aortic valve  She established with Dr. Sandee Crook in 2019 initially for preoperative examination for upcoming shoulder surgery.  She was did not seen for our practice until 2024.  At that time she had had a recent hospitalization for newly diagnosed HFrEF, felt to be consistent with stress-induced cardiomyopathy, she had a cath at that time revealing normal coronary arteries.  Most recently evaluated by Dr. Gordan Latina on 02/04/2023, doing well from a cardiac perspective, she had had some palpitations and a monitor been arranged revealing episodes of ventricular tachycardia.  No changes were made to her medications or plan of care she was advised follow-up in 5 months.  She was admitted to [facility] 09/16/2023 with a 3-day history of worsening palpitations, she was hypertensive in the ED with systolic greater than 200 and her heart rate in the 140 range, EKG revealed wide-complex tachycardia with aberrant  conduction and frequent PVCs, she was given IV metoprolol , troponin 19 >> 55 >> 57.  Her magnesium  was 1.5 and this was repleted.  2-week monitor was arranged as well as a cardiac MRI--both are in the process of being completed.  She presents today for follow up after recent hospitalization as outlined above.  She is doing well, somewhat confused as where she needed to follow-up today.  She is a retired Engineer, civil (consulting), she is well versed  in her care.  Her blood pressure slightly elevated in the office today however she checks her blood pressure at home and states it is typically in the 110-120 range.  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  She finished wearing her monitor and mailed it back a few days ago, results still pending. She denies chest pain, palpitations, dyspnea, pnd, orthopnea, n, v, dizziness, syncope, edema, weight gain, or early satiety.   ROS: Review of Systems  Musculoskeletal:  Positive for neck pain.  Endo/Heme/Allergies:  Bruises/bleeds easily.     Studies Reviewed: .        Cardiac Studies & Procedures   ______________________________________________________________________________________________ CARDIAC CATHETERIZATION  CARDIAC CATHETERIZATION 09/08/2022  Conclusion Conclusions: No angiographically significant coronary artery disease. Mildly elevated left ventricular filling pressure (LVEDP ~20 mmHg).  Recommendations: Continue escalation of goal-directed medical therapy for nonischemic cardiomyopathy. Consider gentle diuresis as renal function allows. Primary prevention of coronary artery disease.  Sammy Crisp, MD [facility]  Findings Coronary Findings Diagnostic  Dominance: Right  Left Main Vessel is large.  Vessel is angiographically normal.  Left Anterior Descending Vessel is moderate in size.  First Diagonal Branch Vessel is  small in size.  Second Diagonal Branch Vessel is small in size.  Left Circumflex Vessel is large. Vessel is angiographically normal.  First Obtuse Marginal Branch Vessel is moderate in size.  Second Obtuse Marginal Branch Vessel is small in size.  Third Obtuse Marginal Branch Vessel is large in size.  Right Coronary Artery Vessel is moderate in size.  Right Posterior Descending Artery Vessel is moderate in size.  Right Posterior Atrioventricular Artery Vessel is moderate in size.  First Right Posterolateral Branch Vessel is small in size.  Second Right Posterolateral Branch Vessel is small in size.  Third Right Posterolateral Branch Vessel is small in size.  Intervention  No interventions have been documented.     ECHOCARDIOGRAM  ECHOCARDIOGRAM COMPLETE 09/17/2023  Narrative ECHOCARDIOGRAM REPORT    Patient Name:   ZAYAH CATOE [facility] Date of Exam: 09/17/2023 Medical Rec #:  [id number]         Height:       64.0 in Accession #:    [id number]        Weight:       114.0 lb Date of Birth:  03/26/1941         BSA:          1.541 m Patient Age:    82 years          BP:           100/66 mmHg Patient Gender: F                 HR:           71 bpm. Exam Location:  Inpatient  Procedure: 2D Echo (Both Spectral and Color Flow Doppler were utilized during procedure).  Indications:    ventricular tachycardia  History:        Patient has prior history of Echocardiogram examinations, most recent 10/15/2022. Cardiomyopathy; breast cancer.  Sonographer:    Dione Franks RDCS Referring Phys: [id number] Christena Covert   Sonographer Comments: Global longitudinal strain was attempted. IMPRESSIONS   1. Left ventricular ejection fraction, by estimation, is 55 to 60%. The left ventricle has normal function. The left ventricle has no regional wall motion abnormalities. There is mild concentric left ventricular hypertrophy. Left ventricular diastolic parameters are  consistent with Grade I diastolic dysfunction (impaired relaxation). 2. Right ventricular systolic function is normal. The right ventricular size is normal. 3. Left atrial size was mildly dilated. 4. The mitral valve is degenerative. Trivial mitral valve regurgitation. No evidence of mitral stenosis. 5. The aortic valve is tricuspid. There is mild calcification of the aortic valve. Aortic valve regurgitation is trivial. Aortic valve sclerosis/calcification is present, without any evidence of aortic stenosis. 6. The inferior vena cava is normal in size with greater than 50% respiratory variability, suggesting right atrial pressure of 3 mmHg.  Conclusion(s)/Recommendation(s): EF has recovered since last study.  FINDINGS Left Ventricle: Left ventricular ejection fraction, by estimation, is 55 to 60%. The left ventricle has normal function. The left ventricle has no regional wall motion abnormalities. The left ventricular internal cavity size was normal in size. There is mild concentric left ventricular hypertrophy. Left ventricular diastolic parameters are consistent with Grade I diastolic dysfunction (impaired relaxation).  Right Ventricle: The right ventricular size is normal. No increase in right ventricular wall thickness. Right ventricular systolic function is normal.  Left Atrium: Left atrial size was mildly dilated.  Right Atrium: Right atrial size was normal in size.  Pericardium: There is no evidence of pericardial effusion.  Mitral Valve: The mitral valve is degenerative in appearance. There is mild thickening of the mitral valve leaflet(s). There is mild calcification of the mitral valve leaflet(s). Mild mitral annular calcification. Trivial mitral valve regurgitation. No evidence of mitral valve stenosis.  Tricuspid Valve: The tricuspid valve is normal in structure. Tricuspid valve regurgitation is trivial. No evidence of tricuspid stenosis.  Aortic Valve: The aortic valve is  tricuspid. There is mild calcification of the aortic valve. Aortic valve regurgitation is trivial. Aortic valve sclerosis/calcification is present, without any evidence of aortic stenosis.  Pulmonic Valve: The pulmonic valve was normal in structure. Pulmonic valve regurgitation is not visualized. No evidence of pulmonic stenosis.  Aorta: The aortic root is normal in size and structure.  Venous: The inferior vena cava is normal in size with greater than 50% respiratory variability, suggesting right atrial pressure of 3 mmHg.  IAS/Shunts: No atrial level shunt detected by color flow Doppler.   LEFT VENTRICLE PLAX 2D LVIDd:         4.30 cm   Diastology LVIDs:         2.90 cm   LV e' medial:    5.77 cm/s LV PW:         1.10 cm   LV E/e' medial:  17.3 LV IVS:        1.10 cm   LV e' lateral:   6.74 cm/s LVOT diam:     1.60 cm   LV E/e' lateral: 14.8 LV SV:         37 LV SV Index:   24 LVOT Area:     2.01 cm   RIGHT VENTRICLE             IVC RV Basal diam:  2.10 cm     IVC diam: 1.20 cm RV S prime:     15.20 cm/s TAPSE (M-mode): 2.4 cm  LEFT ATRIUM             Index        RIGHT ATRIUM          Index LA diam:        3.80 cm 2.47 cm/m   RA Area:     7.61 cm LA Vol (A2C):   33.2 ml 21.55 ml/m  RA Volume:   13.10 ml 8.50 ml/m LA Vol (A4C):   37.4 ml 24.28 ml/m LA Biplane Vol: 35.2 ml 22.85 ml/m AORTIC VALVE LVOT Vmax:   82.40 cm/s LVOT Vmean:  53.900 cm/s LVOT VTI:    0.182 m  AORTA Ao Root diam: 2.60 cm Ao Asc diam:  2.70 cm  MITRAL VALVE MV Area (PHT): 5.38 cm     SHUNTS MV Decel Time: 141 msec     Systemic VTI:  0.18 m MV E velocity: 100.00 cm/s  Systemic Diam: 1.60 cm MV A velocity: 137.00 cm/s MV E/A ratio:  0.73  Jules Oar MD Electronically signed by Jules Oar MD Signature Date/Time: 09/17/2023/3:45:12 PM    Final    MONITORS  LONG TERM MONITOR (3-14 DAYS) 01/05/2023  Narrative Patch Wear Time:  6 days and 22 hours  (2024-07-26T16:29:07-398 to 2024-08-02T15:14:40-0400)  Patient had a min HR of 51 bpm, max HR of 150 bpm, and avg HR of 66 bpm. Predominant underlying rhythm was Sinus Rhythm. First Degree AV Block was present. Intermittent Bundle Branch Block was present. 2 Ventricular Tachycardia runs  occurred, the run with the fastest interval lasting 4 beats with a max rate of 150 bpm, the longest lasting 5 beats with an avg rate of 135 bpm. 2 Supraventricular Tachycardia runs occurred, the run with the fastest interval lasting 7 beats with a max rate of 136 bpm, the longest lasting 10 beats with an avg rate of 99 bpm. Isolated SVEs were rare (<1.0%), SVE Couplets were rare (<1.0%), and SVE Triplets were rare (<1.0%). Isolated VEs were occasional (3.2%, 20892), VE Couplets were rare (<1.0%, 7), and no VE Triplets were present. Ventricular Trigeminy was present.  Summary conclusions: 2 episode of ventricular tachycardia, longest episode 5 beats at rate of 135. 2 supraventricular tachycardia with the longest episode 10 beats at rate of 99. Occasional PVCs total burden of 3.2% Total of 73 triggered events for PVCs       ______________________________________________________________________________________________      Risk Assessment/Calculations:        Physical Exam:   VS:  BP (!) 140/70   Pulse 60   Ht 5\' 4"  (1.626 m)   Wt 121 lb (54.9 kg)   SpO2 99%   BMI 20.77 kg/m    Wt Readings from Last 3 Encounters:  10/09/23 121 lb (54.9 kg)  09/16/23 114 lb (51.7 kg)  02/04/23 122 lb (55.3 kg)    GEN: Appears younger than stated age, well nourished, well developed in no acute distress NECK: No JVD; No carotid bruits CARDIAC: RRR, no murmurs, rubs, gallops RESPIRATORY:  Clear to auscultation without rales, wheezing or rhonchi  ABDOMEN: Soft, non-tender, non-distended EXTREMITIES:  No edema; No deformity  SKIN: purpura rash noted to LLL  ASSESSMENT AND PLAN: .   HFimpEF -[name] class I, euvolemic,  continue metoprolol  100 mg twice daily.  NSVT/palpitations -currently quiescent, continue metoprolol  100 mg twice daily.  Recently completed monitor but results are still pending.  Cardiac MRI arranged for 11/02/2023.  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  Will have her keep her blood pressure log.  Hypomagnesemia-was repleted, will repeat magnesium  level today. Purpura rash-this is new and just showed up on her leg a few days ago, we will repeat CBC and platelets.  Dyslipidemia-this appears to be followed by her PCP, currently on Crestor  20 mg each evening.        Dispo: BMET, CBC, magnesium  level, blood pressure log for 2 weeks, follow-up with Dr. Gordan Latina  Signed, Terrance Ferretti, NP

## 2023-10-09 ENCOUNTER — Ambulatory Visit: Attending: Cardiology | Admitting: Cardiology

## 2023-10-09 ENCOUNTER — Encounter: Payer: Self-pay | Admitting: Cardiology

## 2023-10-09 VITALS — BP 140/70 | HR 60 | Ht 64.0 in | Wt 121.0 lb

## 2023-10-09 DIAGNOSIS — I5032 Chronic diastolic (congestive) heart failure: Secondary | ICD-10-CM

## 2023-10-09 DIAGNOSIS — I4729 Other ventricular tachycardia: Secondary | ICD-10-CM | POA: Diagnosis not present

## 2023-10-09 DIAGNOSIS — E782 Mixed hyperlipidemia: Secondary | ICD-10-CM

## 2023-10-09 DIAGNOSIS — R002 Palpitations: Secondary | ICD-10-CM | POA: Diagnosis not present

## 2023-10-09 DIAGNOSIS — I5020 Unspecified systolic (congestive) heart failure: Secondary | ICD-10-CM

## 2023-10-09 DIAGNOSIS — I10 Essential (primary) hypertension: Secondary | ICD-10-CM

## 2023-10-09 NOTE — Patient Instructions (Addendum)
 Medication Instructions:  Your physician recommends that you continue on your current medications as directed. Please refer to the Current Medication list given to you today.  *If you need a refill on your cardiac medications before your next appointment, please call your pharmacy*   Lab Work: Your physician recommends that you have labs done in the office today. Your test included  basic metabolic panel,and magnesium .   If you have labs (blood work) drawn today and your tests are completely normal, you will receive your results only by: MyChart Message (if you have MyChart) OR A paper copy in the mail If you have any lab test that is abnormal or we need to change your treatment, we will call you to review the results.   Testing/Procedures: None Ordered   Follow-Up: At [facility], you and your health needs are our priority.  As part of our continuing mission to provide you with exceptional heart care, we have created designated Provider Care Teams.  These Care Teams include your primary Cardiologist (physician) and Advanced Practice Providers (APPs -  Physician Assistants and Nurse Practitioners) who all work together to provide you with the care you need, when you need it.  We recommend signing up for the patient portal called "MyChart".  Sign up information is provided on this After Visit Summary.  MyChart is used to connect with patients for Virtual Visits (Telemedicine).  Patients are able to view lab/test results, encounter notes, upcoming appointments, etc.  Non-urgent messages can be sent to your provider as well.   To learn more about what you can do with MyChart, go to [email or web address].    Your next appointment:   3 month follow up

## 2023-10-10 LAB — CBC WITH DIFFERENTIAL/PLATELET
Basophils Absolute: 0 x10E3/uL (ref 0.0–0.2)
Basos: 0 %
EOS (ABSOLUTE): 0 x10E3/uL (ref 0.0–0.4)
Eos: 1 %
Hematocrit: 36.8 % (ref 34.0–46.6)
Hemoglobin: 12.1 g/dL (ref 11.1–15.9)
Immature Grans (Abs): 0.1 x10E3/uL (ref 0.0–0.1)
Immature Granulocytes: 1 %
Lymphocytes Absolute: 1.6 x10E3/uL (ref 0.7–3.1)
Lymphs: 20 %
MCH: 31.3 pg (ref 26.6–33.0)
MCHC: 32.9 g/dL (ref 31.5–35.7)
MCV: 95 fL (ref 79–97)
Monocytes Absolute: 0.7 x10E3/uL (ref 0.1–0.9)
Monocytes: 9 %
Neutrophils Absolute: 5.5 x10E3/uL (ref 1.4–7.0)
Neutrophils: 69 %
Platelets: 250 x10E3/uL (ref 150–450)
RBC: 3.87 x10E6/uL (ref 3.77–5.28)
RDW: 14.4 % (ref 11.7–15.4)
WBC: 7.9 x10E3/uL (ref 3.4–10.8)

## 2023-10-10 LAB — BASIC METABOLIC PANEL WITH GFR
BUN/Creatinine Ratio: 14 (ref 12–28)
BUN: 10 mg/dL (ref 8–27)
CO2: 21 mmol/L (ref 20–29)
Calcium: 9 mg/dL (ref 8.7–10.3)
Chloride: 99 mmol/L (ref 96–106)
Creatinine, Ser: 0.74 mg/dL (ref 0.57–1.00)
Glucose: 95 mg/dL (ref 70–99)
Potassium: 4.5 mmol/L (ref 3.5–5.2)
Sodium: 136 mmol/L (ref 134–144)
eGFR: 80 mL/min/1.73

## 2023-10-10 LAB — MAGNESIUM: Magnesium: 1.9 mg/dL (ref 1.6–2.3)

## 2023-10-11 ENCOUNTER — Ambulatory Visit: Payer: Self-pay | Admitting: Cardiology

## 2023-10-12 DIAGNOSIS — R233 Spontaneous ecchymoses: Secondary | ICD-10-CM | POA: Diagnosis not present

## 2023-10-21 DIAGNOSIS — I10 Essential (primary) hypertension: Secondary | ICD-10-CM | POA: Diagnosis not present

## 2023-10-21 DIAGNOSIS — F411 Generalized anxiety disorder: Secondary | ICD-10-CM | POA: Diagnosis not present

## 2023-10-21 DIAGNOSIS — Z8679 Personal history of other diseases of the circulatory system: Secondary | ICD-10-CM | POA: Diagnosis not present

## 2023-10-21 DIAGNOSIS — D692 Other nonthrombocytopenic purpura: Secondary | ICD-10-CM | POA: Diagnosis not present

## 2023-10-23 DIAGNOSIS — M25551 Pain in right hip: Secondary | ICD-10-CM | POA: Diagnosis not present

## 2023-10-24 DIAGNOSIS — F4322 Adjustment disorder with anxiety: Secondary | ICD-10-CM | POA: Diagnosis not present

## 2023-10-26 DIAGNOSIS — I4729 Other ventricular tachycardia: Secondary | ICD-10-CM | POA: Diagnosis not present

## 2023-10-27 ENCOUNTER — Ambulatory Visit: Payer: Self-pay | Admitting: Student

## 2023-10-29 ENCOUNTER — Encounter (HOSPITAL_COMMUNITY): Payer: Self-pay

## 2023-11-02 ENCOUNTER — Ambulatory Visit (HOSPITAL_COMMUNITY): Admission: RE | Admit: 2023-11-02 | Source: Ambulatory Visit

## 2023-11-16 ENCOUNTER — Encounter (HOSPITAL_COMMUNITY): Payer: Self-pay | Admitting: Cardiology

## 2023-11-16 ENCOUNTER — Encounter: Payer: Self-pay | Admitting: Physician Assistant

## 2023-11-23 DIAGNOSIS — F4322 Adjustment disorder with anxiety: Secondary | ICD-10-CM | POA: Diagnosis not present

## 2023-12-10 ENCOUNTER — Ambulatory Visit: Admitting: Cardiology

## 2023-12-16 DIAGNOSIS — R1011 Right upper quadrant pain: Secondary | ICD-10-CM | POA: Diagnosis not present

## 2023-12-16 DIAGNOSIS — R140 Abdominal distension (gaseous): Secondary | ICD-10-CM | POA: Diagnosis not present

## 2023-12-21 ENCOUNTER — Other Ambulatory Visit (HOSPITAL_COMMUNITY): Payer: Self-pay | Admitting: Adult Health

## 2023-12-21 DIAGNOSIS — I5021 Acute systolic (congestive) heart failure: Secondary | ICD-10-CM

## 2023-12-21 NOTE — Progress Notes (Deleted)
 [facility]   [phone] [phone]    Patient Care Team: Aisha Harvey, MD as PCP - General (Family Medicine) Bernie Lamar PARAS, MD as PCP - Cardiology (Cardiology) Aisha Harvey, MD as Consulting Physician (Family Medicine) Belinda Cough, MD as Consulting Physician (General Surgery) Lanny Callander, MD as Consulting Physician (Hematology) Dewey Rush, MD as Consulting Physician (Radiation Oncology) Tyree Nanetta SAILOR, RN as Oncology Nurse Navigator Glean, Stephane BROCKS, RN (Inactive) as Oncology Nurse Navigator Ann Mayme POUR, NP as Nurse Practitioner (Nurse Practitioner)   CHIEF COMPLAINT: Follow up right breast cancer   Oncology History Overview Note   Cancer Staging  Malignant neoplasm of overlapping sites of right female breast [facility]) Staging form: Breast, AJCC 8th Edition - Clinical stage from 06/25/2021: Stage IA (cT1a, cN0, cM0, G1, ER+, PR+, HER2-) - Signed by Lanny Callander, MD on 07/02/2021    Malignant neoplasm of overlapping sites of right female breast (HCC)  06/19/2021 Imaging   EXAM: DIGITAL DIAGNOSTIC UNILATERAL RIGHT MAMMOGRAM WITH TOMOSYNTHESIS AND CAD; ULTRASOUND RIGHT BREAST LIMITED  FINDINGS: The mass in the right breast on the MLO view remains. On screening mammography, it appears mildly spiculated. It is not definitely spiculated based on today's spot imaging. It is seen only on the MLO view near the level the nipple. It is medially located based on 3D slices.   On physical exam, no suspicious lumps are identified.   Targeted ultrasound is performed, showing a solid mass in the right breast at 3 o'clock, 6 cm the nipple likely correlating with the mammographically identified mass. There is internal blood flow. This irregular mass measures 3 by 4 x 4 mm. No axillary adenopathy.   IMPRESSION: New solid irregular mass in the right breast at 3 o'clock, 6 cm from the nipple on ultrasound. No axillary adenopathy.   06/25/2021 Cancer  Staging   Staging form: Breast, AJCC 8th Edition - Clinical stage from 06/25/2021: Stage IA (cT1a, cN0, cM0, G1, ER+, PR+, HER2-) - Signed by Lanny Callander, MD on 07/02/2021 Stage prefix: Initial diagnosis Histologic grading system: 3 grade system   06/25/2021 Initial Biopsy   Diagnosis Breast, right, needle core biopsy, 3 o'clock, 6cmfn - INVASIVE DUCTAL CARCINOMA WITH CALCIFICATIONS - SEE COMMENT Microscopic Comment Based on the biopsy, the carcinoma appears Nottingham grade 1 of 3 and measures 0.4 cm in greatest linear extent.  PROGNOSTIC INDICATORS Results: Estrogen Receptor: 100%, POSITIVE, STRONG STAINING INTENSITY Progesterone Receptor: 80%, POSITIVE, MODERATE STAINING INTENSITY Proliferation Marker Ki67: 10%  FLUORESCENCE IN-SITU HYBRIDIZATION Results: GROUP 5: HER2 **NEGATIVE**   07/01/2021 Initial Diagnosis   Malignant neoplasm of overlapping sites of right female breast [facility])    Genetic Testing   [facility] CancerNext-Expanded Panel was Negative. 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 Report date is 07/26/2021.  The CancerNext-Expanded gene panel offered by [facility] and includes sequencing, rearrangement, and RNA analysis for the following 77 genes: AIP, ALK, APC, ATM, AXIN2, BAP1, BARD1, BLM, BMPR1A, BRCA1, BRCA2, BRIP1, CDC73, CDH1, CDK4, CDKN1B, CDKN2A, CHEK2, CTNNA1, DICER1, FANCC, FH, FLCN, GALNT12, KIF1B, LZTR1, MAX, MEN1, MET, MLH1, MSH2, MSH3, MSH6, MUTYH, NBN, NF1, NF2, NTHL1, PALB2, PHOX2B, PMS2, POT1, PRKAR1A, PTCH1, PTEN, RAD51C, RAD51D, RB1, RECQL, RET, SDHA, SDHAF2, SDHB, SDHC, SDHD, SMAD4, SMARCA4, SMARCB1, SMARCE1, STK11, SUFU, TMEM127, TP53, TSC1, TSC2, VHL and XRCC2 (sequencing and deletion/duplication); EGFR, EGLN1, HOXB13, KIT, MITF, PDGFRA, POLD1, and POLE (sequencing only); EPCAM and GREM1 (deletion/duplication only).    07/09/2021 Cancer  Staging   Staging form: Breast, AJCC 8th Edition - Pathologic stage from  07/09/2021: Stage Unknown (pT1c, pNX, cM0, G1, ER+, PR+, HER2-) - Signed by Lanny Callander, MD on 08/20/2021 Stage prefix: Initial diagnosis Histologic grading system: 3 grade system Residual tumor (R): R0 - None   11/27/2021 Survivorship   SCP delivered by Teena Mangus, NP      CURRENT THERAPY: Surveillance   INTERVAL HISTORY Jasmine Swanson returns for follow up as scheduled. Last seen by me 12/16/22  ROS   Past Medical History:  Diagnosis Date   Acute pulmonary edema (HCC) 09/06/2022   Acute respiratory failure with hypoxia (HCC) 09/06/2022   Acute systolic CHF (congestive heart failure) (HCC) 09/08/2022   Anxiety    Arthritis    Basal cell carcinoma 07/03/2021   Bundle branch block, left    Cancer (HCC)    Complication of anesthesia    laryngeal spams 50 yrs ago   DCM (dilated cardiomyopathy) (HCC) 09/08/2022   Depression    DNR (do not resuscitate) 09/06/2022   Dysphagia    Elevated troponin 09/06/2022   Essential hypertension 06/17/2013   Gastro-esophageal reflux disease without esophagitis 07/03/2021   Generalized anxiety disorder 07/03/2021   Genetic testing 08/02/2021   [facility] CancerNext-Expanded Panel was Negative. 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 Report date is 07/26/2021.     The CancerNext-Expanded gene panel offered by [facility] and includes sequencing, rearrangement, and RNA analysis for the following 77 genes: AIP, ALK, APC, ATM, AXIN2, BAP1, BARD1, BLM, BMPR1A, BRCA1, BRCA2, BRIP1, CDC73, CDH1, CDK4,    GERD (gastroesophageal reflux disease)    Hereditary and idiopathic neuropathy, unspecified 09/18/2022   History of breast cancer 09/18/2022   Hyperlipidemia    Hypokalemia 09/08/2022   Kyphosis    Left bundle branch block 07/03/2021   Low back pain 09/18/2022   Malignant neoplasm of overlapping sites of right female breast (HCC) 07/01/2021   Malignant neoplasm of upper-inner quadrant of right breast in female,  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   Menopausal symptoms 09/18/2022   Non-ST elevation (NSTEMI) myocardial infarction (HCC) 09/08/2022   Osteopenia of right thigh 07/03/2021   Other chronic pain 07/03/2021   PAC (premature atrial contraction) 06/17/2013   Pain in right knee 01/01/2018   Parageusia 07/03/2021   PAT (paroxysmal atrial tachycardia) (HCC) 06/17/2013   Personal history of colonic polyps 07/03/2021   Plantar fasciitis of right foot    Preop cardiovascular exam 10/16/2017   Preoperative cardiovascular examination 10/16/2017   Primary localized osteoarthrosis of right shoulder 11/10/2017   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   Restless legs syndrome 07/03/2021   Right shoulder pain 01/20/2012     Past Surgical History:  Procedure Laterality Date   ABDOMINAL HYSTERECTOMY     bladder tac     BREAST BIOPSY Right 06/25/2021   BREAST LUMPECTOMY Right 07/09/2021   BREAST LUMPECTOMY WITH RADIOACTIVE SEED LOCALIZATION Right 07/09/2021   Procedure: RIGHT BREAST LUMPECTOMY WITH RADIOACTIVE SEED LOCALIZATION;  Surgeon: Belinda Cough, MD;  Location: [facility];  Service: General;  Laterality: Right;   CARDIAC CATHETERIZATION     LEFT HEART CATH AND CORONARY ANGIOGRAPHY N/A 09/08/2022   Procedure: LEFT HEART CATH AND CORONARY ANGIOGRAPHY;  Surgeon: Mady Bruckner, MD;  Location: [facility];  Service: Cardiovascular;  Laterality: N/A;   TONSILLECTOMY     TOTAL SHOULDER ARTHROPLASTY Right 11/10/2017   Procedure: TOTAL SHOULDER ARTHROPLASTY;  Surgeon: Josefina Chew,  MD;  Location: [facility];  Service: Orthopedics;  Laterality: Right;     Outpatient Encounter Medications as of 12/22/2023  Medication Sig   acetaminophen  (TYLENOL ) 500 MG tablet Take 500 mg by mouth every 6 (six) hours as needed for moderate pain.   Calcium  Carb-Cholecalciferol  (CALCIUM -VITAMIN D ) 600-400 MG-UNIT TABS Take 1 tablet by mouth daily.   calcium  carbonate  (TUMS - DOSED IN MG ELEMENTAL CALCIUM ) 500 MG chewable tablet Chew 1 tablet by mouth daily as needed for indigestion or heartburn.   Cholecalciferol  125 MCG (5000 UT) TABS Take 5,000 Units by mouth daily.   escitalopram  (LEXAPRO ) 10 MG tablet Take 10 mg by mouth daily as needed (for anxiety).   fish oil-omega-3 fatty acids  1000 MG capsule Take 1 g by mouth every other day.   Flaxseed, Linseed, (FLAXSEED OIL) 1000 MG CAPS Take 1,000 mg by mouth every other day.   LORazepam  (ATIVAN ) 0.5 MG tablet Take 0.5 mg by mouth every 6 (six) hours as needed for anxiety.   Magnesium  250 MG TABS Take 250 mg by mouth daily.    metoprolol  (TOPROL -XL) 100 MG 24 hr tablet Take 1 tablet (100 mg total) by mouth in the morning and at bedtime.   omeprazole  (PRILOSEC) 40 MG capsule Take 1 capsule by mouth twice daily   pyridOXINE (VITAMIN B-6) 100 MG tablet Take 100 mg by mouth daily.   rosuvastatin  (CRESTOR ) 20 MG tablet Take 20 mg by mouth every evening.   No facility-administered encounter medications on file as of 12/22/2023.     There were no vitals filed for this visit. There is no height or weight on file to calculate BMI.   ECOG PERFORMANCE STATUS: {CHL ONC ECOG [phone]}  PHYSICAL EXAM GENERAL:alert, no distress and comfortable SKIN: no rash  EYES: sclera clear NECK: without mass LYMPH:  no palpable cervical or supraclavicular lymphadenopathy  LUNGS: clear with normal breathing effort HEART: regular rate & rhythm, no lower extremity edema ABDOMEN: abdomen soft, non-tender and normal bowel sounds NEURO: alert & oriented x 3 with fluent speech, no focal motor/sensory deficits Breast exam:  PAC without erythema    CBC    Latest Ref Rng & Units 10/09/2023   10:48 AM 09/16/2023    5:50 PM 09/09/2022    1:05 AM  CBC  WBC 3.4 - 10.8 x10E3/uL 7.9  7.7  6.6   Hemoglobin 11.1 - 15.9 g/dL 87.8  86.6  88.2   Hematocrit 34.0 - 46.6 % 36.8  38.5  35.9   Platelets 150 - 450 x10E3/uL 250  166  149        CMP     Latest Ref Rng & Units 10/09/2023   10:48 AM 09/17/2023    6:41 AM 09/16/2023    5:50 PM  CMP  Glucose 70 - 99 mg/dL 95  93  897   BUN 8 - 27 mg/dL 10  9  9    Creatinine 0.57 - 1.00 mg/dL 9.25  9.29  9.26   Sodium 134 - 144 mmol/L 136  134  135   Potassium 3.5 - 5.2 mmol/L 4.5  4.4  3.4   Chloride 96 - 106 mmol/L 99  104  101   CO2 20 - 29 mmol/L 21  21  22    Calcium  8.7 - 10.3 mg/dL 9.0  8.4  8.6   Total Protein 6.5 - 8.1 g/dL   6.1   Total Bilirubin 0.0 - 1.2 mg/dL   0.9   Alkaline Phos 38 -  126 U/L   44   AST 15 - 41 U/L   22   ALT 0 - 44 U/L   20       ASSESSMENT & PLAN: 83 year old female     1. Stage I right breast cancer, invasive ductal carcinoma and DCIS, ER+/PR+/HER2- -Diagnosed in 04/2021, s/p lumpectomy 07/09/2021.  She declined adjuvant radiation therapy and anti-estrogen therapy.    2. Acute hypoxic respiratory failure secondary to flash pulmonary edema from acute heart failure/NSTEMI -Hospitalized 08/2022, continues to recover -Felt to be stress-induced -Continue med regimen and follow-up with Dr. Krasowski/cardiology   PLAN:  No orders of the defined types were placed in this encounter.     All questions were answered. The patient knows to call the clinic with any problems, questions or concerns. No barriers to learning were detected. I spent *** counseling the patient face to face. The total time spent in the appointment was *** and more than 50% was on counseling, review of test results, and coordination of care.   Bronsen Serano K Nimai Burbach, NP 12/21/2023 9:17 PM

## 2023-12-22 ENCOUNTER — Inpatient Hospital Stay: Payer: Medicare Other | Attending: Nurse Practitioner | Admitting: Nurse Practitioner

## 2023-12-22 ENCOUNTER — Telehealth: Payer: Self-pay

## 2023-12-22 NOTE — Telephone Encounter (Signed)
 Called patient due to not showing up for her 1100 app with Lacie Burton NP will no show patient today. Left VM to call us  back to set up new appointment.

## 2024-01-11 ENCOUNTER — Ambulatory Visit: Payer: Self-pay | Admitting: Physician Assistant

## 2024-01-12 ENCOUNTER — Ambulatory Visit: Admitting: Gastroenterology

## 2024-01-18 ENCOUNTER — Ambulatory Visit: Attending: Cardiology | Admitting: Cardiology

## 2024-01-18 ENCOUNTER — Encounter: Payer: Self-pay | Admitting: Cardiology

## 2024-01-18 VITALS — BP 110/60 | HR 64 | Resp 18 | Ht 63.0 in | Wt 120.0 lb

## 2024-01-18 DIAGNOSIS — R002 Palpitations: Secondary | ICD-10-CM

## 2024-01-18 DIAGNOSIS — I428 Other cardiomyopathies: Secondary | ICD-10-CM

## 2024-01-18 DIAGNOSIS — I4729 Other ventricular tachycardia: Secondary | ICD-10-CM | POA: Diagnosis not present

## 2024-01-18 NOTE — Progress Notes (Signed)
 Cardiology Office Note:    Date:  01/18/2024   ID:  Jasmine Swanson, DOB October 25, 1940, MRN [id number]  PCP:  Aisha Harvey, MD  Cardiologist:  Lamar Fitch, MD    Referring MD: Aisha Harvey, MD   Chief Complaint  Patient presents with   Follow-up    History of Present Illness:    Jasmine Swanson is a 83 y.o. female past medical history significant for cardiomyopathy initial ejection fraction 30 to 35%, cardiac cath after that showed normal coronaries she was put on guideline directed medical therapy except she does have history of swelling with ACE inhibitor some ACE inhibitor no ARB no Entresto in spite of that her ejection fraction normalized she was recently evaluated again for frequent ventricular ectopy nonsustained ventricular tachycardia, echocardiogram showed preserved ejection fraction.  She comes today to follow-up.  Overall she is doing fine.  She is afraid to walk because she is worried something can happen.  Otherwise doing fine no dizziness no palpitations no passing out  Past Medical History:  Diagnosis Date   Acute pulmonary edema (HCC) 09/06/2022   Acute respiratory failure with hypoxia (HCC) 09/06/2022   Acute systolic CHF (congestive heart failure) (HCC) 09/08/2022   Anxiety    Arthritis    Basal cell carcinoma 07/03/2021   Bundle branch block, left    Cancer (HCC)    Complication of anesthesia    laryngeal spams 50 yrs ago   DCM (dilated cardiomyopathy) (HCC) 09/08/2022   Depression    DNR (do not resuscitate) 09/06/2022   Dysphagia    Elevated troponin 09/06/2022   Essential hypertension 06/17/2013   Gastro-esophageal reflux disease without esophagitis 07/03/2021   Generalized anxiety disorder 07/03/2021   Genetic testing 08/02/2021   [facility] CancerNext-Expanded Panel was Negative. 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 Report date is 07/26/2021.     The CancerNext-Expanded gene panel offered by [facility] and includes sequencing, rearrangement, and RNA analysis for the following 77 genes: AIP, ALK, APC, ATM, AXIN2, BAP1, BARD1, BLM, BMPR1A, BRCA1, BRCA2, BRIP1, CDC73, CDH1, CDK4,    GERD (gastroesophageal reflux disease)    Hereditary and idiopathic neuropathy, unspecified 09/18/2022   History of breast cancer 09/18/2022   Hyperlipidemia    Hypokalemia 09/08/2022   Kyphosis    Left bundle branch block 07/03/2021   Low back pain 09/18/2022   Malignant neoplasm of overlapping sites of right female breast (HCC) 07/01/2021   Malignant neoplasm of upper-inner quadrant of right breast in female, 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   Menopausal symptoms 09/18/2022   Non-ST elevation (NSTEMI) myocardial infarction (HCC) 09/08/2022   Osteopenia of right thigh 07/03/2021   Other chronic pain 07/03/2021   PAC (premature atrial contraction) 06/17/2013   Pain in right knee 01/01/2018   Parageusia 07/03/2021   PAT (paroxysmal atrial tachycardia) (HCC) 06/17/2013   Personal history of colonic polyps 07/03/2021   Plantar fasciitis of right foot    Preop cardiovascular exam 10/16/2017   Preoperative cardiovascular examination 10/16/2017   Primary localized osteoarthrosis of right shoulder 11/10/2017   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   Restless legs syndrome 07/03/2021   Right shoulder pain 01/20/2012    Past Surgical History:  Procedure Laterality Date   ABDOMINAL HYSTERECTOMY     bladder tac     BREAST BIOPSY Right 06/25/2021   BREAST LUMPECTOMY Right 07/09/2021   BREAST LUMPECTOMY WITH RADIOACTIVE SEED LOCALIZATION Right 07/09/2021   Procedure: RIGHT BREAST LUMPECTOMY  WITH RADIOACTIVE SEED LOCALIZATION;  Surgeon: Belinda Cough, MD;  Location: [facility];  Service: General;  Laterality: Right;   CARDIAC CATHETERIZATION     LEFT HEART CATH AND CORONARY ANGIOGRAPHY N/A 09/08/2022   Procedure: LEFT HEART CATH AND CORONARY ANGIOGRAPHY;   Surgeon: Mady Bruckner, MD;  Location: [facility];  Service: Cardiovascular;  Laterality: N/A;   TONSILLECTOMY     TOTAL SHOULDER ARTHROPLASTY Right 11/10/2017   Procedure: TOTAL SHOULDER ARTHROPLASTY;  Surgeon: Josefina Chew, MD;  Location: [facility];  Service: Orthopedics;  Laterality: Right;    Current Medications: Current Meds  Medication Sig   acetaminophen  (TYLENOL ) 500 MG tablet Take 500 mg by mouth every 6 (six) hours as needed for moderate pain.   Calcium  Carb-Cholecalciferol  (CALCIUM -VITAMIN D ) 600-400 MG-UNIT TABS Take 1 tablet by mouth daily.   calcium  carbonate (TUMS - DOSED IN MG ELEMENTAL CALCIUM ) 500 MG chewable tablet Chew 1 tablet by mouth daily as needed for indigestion or heartburn.   Cholecalciferol  125 MCG (5000 UT) TABS Take 5,000 Units by mouth daily.   escitalopram  (LEXAPRO ) 10 MG tablet Take 10 mg by mouth daily as needed (for anxiety).   fish oil-omega-3 fatty acids  1000 MG capsule Take 1 g by mouth every other day.   Flaxseed, Linseed, (FLAXSEED OIL) 1000 MG CAPS Take 1,000 mg by mouth every other day.   LORazepam  (ATIVAN ) 0.5 MG tablet Take 0.5 mg by mouth every 6 (six) hours as needed for anxiety.   Magnesium  250 MG TABS Take 250 mg by mouth daily.    metoprolol  (TOPROL -XL) 100 MG 24 hr tablet Take 1 tablet (100 mg total) by mouth in the morning and at bedtime.   omeprazole  (PRILOSEC) 40 MG capsule Take 1 capsule by mouth twice daily   pyridOXINE (VITAMIN B-6) 100 MG tablet Take 100 mg by mouth daily.   rosuvastatin  (CRESTOR ) 20 MG tablet Take 20 mg by mouth every evening.     Allergies:   Amlodipine, Benicar hct [olmesartan medoxomil-hctz], Codeine, Diovan [valsartan], Niacin and related, and Ropinirole hcl   Social History   Socioeconomic History   Marital status: Single    Spouse name: Not on file   Number of children: 1   Years of education: Not on file   Highest education level: Not on file  Occupational History   Occupation: Retired   Tobacco Use   Smoking status: Former    Current packs/day: 0.50    Average packs/day: 0.5 packs/day for 15.0 years (7.5 ttl pk-yrs)    Types: Cigarettes   Smokeless tobacco: Never  Vaping Use   Vaping status: Never Used  Substance and Sexual Activity   Alcohol use: Yes    Comment: ? daily drinker?   Drug use: No   Sexual activity: Not Currently  Other Topics Concern   Not on file  Social History Narrative   Not on file   Social Drivers of Health   Financial Resource Strain: Low Risk  (09/09/2022)   Overall Financial Resource Strain (CARDIA)    Difficulty of Paying Living Expenses: Not hard at all  Food Insecurity: No Food Insecurity (09/17/2023)   Hunger Vital Sign    Worried About Running Out of Food in the Last Year: Never true    Ran Out of Food in the Last Year: Never true  Transportation Needs: No Transportation Needs (09/17/2023)   PRAPARE - Administrator, Civil Service (Medical): No    Lack of Transportation (Non-Medical): No  Physical Activity: Not on file  Stress: Not on file  Social Connections: Moderately Isolated (09/17/2023)   Social Connection and Isolation Panel    Frequency of Communication with Friends and Family: More than three times a week    Frequency of Social Gatherings with Friends and Family: Once a week    Attends Religious Services: Never    Database administrator or Organizations: Yes    Attends Engineer, structural: 1 to 4 times per year    Marital Status: Separated     Family History: The patient's family history includes Breast cancer in her cousin; Colon cancer in her father; Heart attack in her mother; Hyperlipidemia in her mother; Hypertension in her mother; Prostate cancer in her father. There is no history of Sudden death or Diabetes. ROS:   Please see the history of present illness.    All 14 point review of systems negative except as described per history of present illness  EKGs/Labs/Other Studies Reviewed:          Recent Labs: 09/16/2023: ALT 20; TSH 1.162 10/09/2023: BUN 10; Creatinine, Ser 0.74; Hemoglobin 12.1; Magnesium  1.9; Platelets 250; Potassium 4.5; Sodium 136  Recent Lipid Panel No results found for: CHOL, TRIG, HDL, CHOLHDL, VLDL, LDLCALC, LDLDIRECT  Physical Exam:    VS:  BP 110/60 (BP Location: Left Arm, Patient Position: Sitting, Cuff Size: Normal)   Pulse 64   Resp 18   Ht 5' 3 (1.6 m)   Wt 120 lb 0.4 oz (54.4 kg)   SpO2 96%   BMI 21.26 kg/m     Wt Readings from Last 3 Encounters:  01/18/24 120 lb 0.4 oz (54.4 kg)  10/09/23 121 lb (54.9 kg)  09/16/23 114 lb (51.7 kg)     GEN:  Well nourished, well developed in no acute distress HEENT: Normal NECK: No JVD; No carotid bruits LYMPHATICS: No lymphadenopathy CARDIAC: RRR, no murmurs, no rubs, no gallops RESPIRATORY:  Clear to auscultation without rales, wheezing or rhonchi  ABDOMEN: Soft, non-tender, non-distended MUSCULOSKELETAL:  No edema; No deformity  SKIN: Warm and dry LOWER EXTREMITIES: no swelling NEUROLOGIC:  Alert and oriented x 3 PSYCHIATRIC:  Normal affect   ASSESSMENT:    1. NSVT (nonsustained ventricular tachycardia) (HCC)   2. Non-ischemic cardiomyopathy (HCC)   3. Palpitations    PLAN:    In order of problems listed above:  Nonsustained ventricular tachycardia.  She was scheduled to have MRI for some reason it was not done. History of nonischemic cardiomyopathy ejection fraction preserved.  Continue present management, intolerant to ACE inhibitor or ARB Entresto. Palpitations again will do MRI of the heart to make sure structurally everything is fine. Dyslipidemia I did review K PN which show me LDL 61 HDL 39 we will continue present management   Medication Adjustments/Labs and Tests Ordered: Current medicines are reviewed at length with the patient today.  Concerns regarding medicines are outlined above.  No orders of the defined types were placed in this  encounter.  Medication changes: No orders of the defined types were placed in this encounter.   Signed, Lamar DOROTHA Fitch, MD, [facility] 01/18/2024 2:44 PM    [facility]

## 2024-01-18 NOTE — Patient Instructions (Signed)
 Medication Instructions:  Your physician recommends that you continue on your current medications as directed. Please refer to the Current Medication list given to you today.  *If you need a refill on your cardiac medications before your next appointment, please call your pharmacy*   Lab Work: None Ordered If you have labs (blood work) drawn today and your tests are completely normal, you will receive your results only by: MyChart Message (if you have MyChart) OR A paper copy in the mail If you have any lab test that is abnormal or we need to change your treatment, we will call you to review the results.   Testing/Procedures: You are scheduled for Cardiac MRI. Please arrive at the [facility] main entrance of [facility]  (30-45 minutes prior to test start time). ?  [facility]  [address]  [place], [place] [address]  [phone]  Proceed to the [facility] Radiology Department (First Floor).  ?  Magnetic resonance imaging (MRI) is a painless test that produces images of the inside of the body without using X-rays. During an MRI, strong magnets and radio waves work together in a Data processing manager to form detailed images. MRI images may provide more details about a medical condition than X-rays, CT scans, and ultrasounds can provide.  You may be given earphones to listen for instructions.  You may eat a light breakfast and take medications as ordered   If a contrast material will be used, an IV will be inserted into one of your veins. Contrast material will be injected into your IV.  You will be asked to remove all metal, including: Watch, jewelry, and other metal objects including hearing aids, hair pieces and dentures. (Braces and fillings normally are not a problem.)  If contrast material was used:  It will leave your body through your urine within a day. You may be told to drink plenty of fluids to help flush the contrast material out of your system.  TEST WILL  TAKE APPROXIMATELY 1 HOUR  PLEASE NOTIFY SCHEDULING AT LEAST 24 HOURS IN ADVANCE IF YOU ARE UNABLE TO KEEP YOUR APPOINTMENT.       Follow-Up: At [facility], you and your health needs are our priority.  As part of our continuing mission to provide you with exceptional heart care, we have created designated Provider Care Teams.  These Care Teams include your primary Cardiologist (physician) and Advanced Practice Providers (APPs -  Physician Assistants and Nurse Practitioners) who all work together to provide you with the care you need, when you need it.  We recommend signing up for the patient portal called MyChart.  Sign up information is provided on this After Visit Summary.  MyChart is used to connect with patients for Virtual Visits (Telemedicine).  Patients are able to view lab/test results, encounter notes, upcoming appointments, etc.  Non-urgent messages can be sent to your provider as well.   To learn more about what you can do with MyChart, go to [email or web address].    Your next appointment:   5 month(s)  The format for your next appointment:   In Person  Provider:   Lamar Fitch, MD    Other Instructions NA

## 2024-01-18 NOTE — Addendum Note (Signed)
 Addended by: ARLOA PLANAS D on: 01/18/2024 02:53 PM   Modules accepted: Orders

## 2024-01-19 LAB — CBC
Hematocrit: 36.9 % (ref 34.0–46.6)
Hemoglobin: 12.6 g/dL (ref 11.1–15.9)
MCH: 33 pg (ref 26.6–33.0)
MCHC: 34.1 g/dL (ref 31.5–35.7)
MCV: 97 fL (ref 79–97)
Platelets: 272 x10E3/uL (ref 150–450)
RBC: 3.82 x10E6/uL (ref 3.77–5.28)
RDW: 11.6 % — ABNORMAL LOW (ref 11.7–15.4)
WBC: 7.7 x10E3/uL (ref 3.4–10.8)

## 2024-01-29 ENCOUNTER — Ambulatory Visit: Payer: Self-pay | Admitting: Cardiology

## 2024-02-09 ENCOUNTER — Telehealth: Payer: Self-pay

## 2024-02-09 NOTE — Telephone Encounter (Signed)
 Left message on My Chart with lab results per Dr. Karry note. Routed to PCP

## 2024-03-08 ENCOUNTER — Telehealth (HOSPITAL_COMMUNITY): Payer: Self-pay | Admitting: Cardiology

## 2024-03-08 NOTE — Telephone Encounter (Signed)
 Spoke with patient to schedule Cardiac MRI. Pt stated she has already spoke with someone in office and is not going to be getting CMRI completed. She can not do it with her claustrophobia. I informed pt I would make clinical team aware.

## 2024-03-24 ENCOUNTER — Encounter (HOSPITAL_COMMUNITY): Payer: Self-pay | Admitting: Cardiology

## 2024-03-24 DIAGNOSIS — T148XXA Other injury of unspecified body region, initial encounter: Secondary | ICD-10-CM | POA: Diagnosis not present

## 2024-03-24 DIAGNOSIS — L089 Local infection of the skin and subcutaneous tissue, unspecified: Secondary | ICD-10-CM | POA: Diagnosis not present

## 2024-04-04 DIAGNOSIS — Z0000 Encounter for general adult medical examination without abnormal findings: Secondary | ICD-10-CM | POA: Diagnosis not present

## 2024-04-04 DIAGNOSIS — Z1331 Encounter for screening for depression: Secondary | ICD-10-CM | POA: Diagnosis not present

## 2024-04-11 DIAGNOSIS — H532 Diplopia: Secondary | ICD-10-CM | POA: Diagnosis not present

## 2024-04-14 DIAGNOSIS — R002 Palpitations: Secondary | ICD-10-CM | POA: Diagnosis not present

## 2024-04-14 DIAGNOSIS — E782 Mixed hyperlipidemia: Secondary | ICD-10-CM | POA: Diagnosis not present

## 2024-04-14 DIAGNOSIS — I10 Essential (primary) hypertension: Secondary | ICD-10-CM | POA: Diagnosis not present

## 2024-04-14 DIAGNOSIS — M85851 Other specified disorders of bone density and structure, right thigh: Secondary | ICD-10-CM | POA: Diagnosis not present

## 2024-04-14 DIAGNOSIS — D692 Other nonthrombocytopenic purpura: Secondary | ICD-10-CM | POA: Diagnosis not present

## 2024-05-05 DIAGNOSIS — R002 Palpitations: Secondary | ICD-10-CM | POA: Diagnosis not present

## 2024-06-01 ENCOUNTER — Ambulatory Visit: Admitting: Cardiology
